# Patient Record
Sex: Female | Born: 1955
Health system: Southern US, Community
[De-identification: ages and names within clinical notes are randomized; demographics above are authoritative.]

## PROBLEM LIST (undated history)

## (undated) DIAGNOSIS — R569 Unspecified convulsions: Secondary | ICD-10-CM

## (undated) DIAGNOSIS — E782 Mixed hyperlipidemia: Secondary | ICD-10-CM

## (undated) DIAGNOSIS — G43909 Migraine, unspecified, not intractable, without status migrainosus: Secondary | ICD-10-CM

## (undated) DIAGNOSIS — G47419 Narcolepsy without cataplexy: Secondary | ICD-10-CM

## (undated) DIAGNOSIS — F329 Major depressive disorder, single episode, unspecified: Secondary | ICD-10-CM

## (undated) DIAGNOSIS — I10 Essential (primary) hypertension: Secondary | ICD-10-CM

## (undated) DIAGNOSIS — M1990 Unspecified osteoarthritis, unspecified site: Secondary | ICD-10-CM

## (undated) DIAGNOSIS — M5136 Other intervertebral disc degeneration, lumbar region: Secondary | ICD-10-CM

## (undated) DIAGNOSIS — M51369 Other intervertebral disc degeneration, lumbar region without mention of lumbar back pain or lower extremity pain: Secondary | ICD-10-CM

## (undated) DIAGNOSIS — Z87442 Personal history of urinary calculi: Secondary | ICD-10-CM

## (undated) DIAGNOSIS — K219 Gastro-esophageal reflux disease without esophagitis: Secondary | ICD-10-CM

## (undated) DIAGNOSIS — E663 Overweight: Secondary | ICD-10-CM

## (undated) DIAGNOSIS — J45909 Unspecified asthma, uncomplicated: Secondary | ICD-10-CM

## (undated) DIAGNOSIS — G4730 Sleep apnea, unspecified: Secondary | ICD-10-CM

## (undated) DIAGNOSIS — G894 Chronic pain syndrome: Secondary | ICD-10-CM

## (undated) DIAGNOSIS — F32A Depression, unspecified: Secondary | ICD-10-CM

## (undated) HISTORY — DX: Gastro-esophageal reflux disease without esophagitis: K21.9

## (undated) HISTORY — PX: TOTAL KNEE ARTHROPLASTY: SHX125

## (undated) HISTORY — DX: Overweight: E66.3

## (undated) HISTORY — DX: Mixed hyperlipidemia: E78.2

## (undated) HISTORY — PX: KNEE ARTHROSCOPY: SUR90

## (undated) HISTORY — PX: SHOULDER SURGERY: SHX246

## (undated) HISTORY — DX: Chronic pain syndrome: G89.4

## (undated) HISTORY — PX: BREAST MASS EXCISION: SHX1267

## (undated) HISTORY — DX: Unspecified osteoarthritis, unspecified site: M19.90

## (undated) HISTORY — DX: Essential (primary) hypertension: I10

## (undated) HISTORY — PX: PILONIDAL CYST EXCISION: SHX744

## (undated) HISTORY — DX: Depression, unspecified: F32.A

## (undated) HISTORY — DX: Major depressive disorder, single episode, unspecified: F32.9

## (undated) HISTORY — PX: MASTECTOMY: SHX3

---

## 2006-01-29 ENCOUNTER — Emergency Department (HOSPITAL_COMMUNITY): Admission: EM | Admit: 2006-01-29 | Discharge: 2006-01-29 | Payer: Self-pay | Admitting: Emergency Medicine

## 2006-02-23 ENCOUNTER — Emergency Department (HOSPITAL_COMMUNITY): Admission: EM | Admit: 2006-02-23 | Discharge: 2006-02-23 | Payer: Self-pay | Admitting: Emergency Medicine

## 2007-01-17 ENCOUNTER — Ambulatory Visit: Payer: Self-pay | Admitting: Cardiology

## 2007-01-18 ENCOUNTER — Ambulatory Visit: Payer: Self-pay | Admitting: Cardiology

## 2007-01-18 ENCOUNTER — Inpatient Hospital Stay (HOSPITAL_COMMUNITY): Admission: RE | Admit: 2007-01-18 | Discharge: 2007-01-19 | Payer: Self-pay | Admitting: Cardiology

## 2007-02-26 ENCOUNTER — Emergency Department (HOSPITAL_COMMUNITY): Admission: EM | Admit: 2007-02-26 | Discharge: 2007-02-26 | Payer: Self-pay | Admitting: Emergency Medicine

## 2007-08-27 ENCOUNTER — Emergency Department (HOSPITAL_COMMUNITY): Admission: EM | Admit: 2007-08-27 | Discharge: 2007-08-27 | Payer: Self-pay | Admitting: *Deleted

## 2008-01-08 ENCOUNTER — Emergency Department (HOSPITAL_COMMUNITY): Admission: EM | Admit: 2008-01-08 | Discharge: 2008-01-08 | Payer: Self-pay | Admitting: Emergency Medicine

## 2008-02-26 ENCOUNTER — Emergency Department (HOSPITAL_COMMUNITY): Admission: EM | Admit: 2008-02-26 | Discharge: 2008-02-26 | Payer: Self-pay | Admitting: Emergency Medicine

## 2008-05-06 ENCOUNTER — Encounter: Admission: RE | Admit: 2008-05-06 | Discharge: 2008-05-06 | Payer: Self-pay | Admitting: Orthopedic Surgery

## 2008-06-26 ENCOUNTER — Ambulatory Visit: Payer: Self-pay | Admitting: Internal Medicine

## 2008-06-26 ENCOUNTER — Inpatient Hospital Stay (HOSPITAL_COMMUNITY): Admission: RE | Admit: 2008-06-26 | Discharge: 2008-07-02 | Payer: Self-pay | Admitting: Orthopedic Surgery

## 2008-06-30 ENCOUNTER — Ambulatory Visit: Payer: Self-pay | Admitting: Vascular Surgery

## 2008-06-30 ENCOUNTER — Encounter: Payer: Self-pay | Admitting: Internal Medicine

## 2008-06-30 ENCOUNTER — Encounter (INDEPENDENT_AMBULATORY_CARE_PROVIDER_SITE_OTHER): Payer: Self-pay | Admitting: Internal Medicine

## 2008-08-25 ENCOUNTER — Emergency Department (HOSPITAL_COMMUNITY): Admission: EM | Admit: 2008-08-25 | Discharge: 2008-08-25 | Payer: Self-pay | Admitting: Emergency Medicine

## 2008-12-17 ENCOUNTER — Ambulatory Visit: Payer: Self-pay | Admitting: Cardiology

## 2009-03-12 ENCOUNTER — Ambulatory Visit (HOSPITAL_BASED_OUTPATIENT_CLINIC_OR_DEPARTMENT_OTHER): Admission: RE | Admit: 2009-03-12 | Discharge: 2009-03-13 | Payer: Self-pay | Admitting: Orthopedic Surgery

## 2009-06-02 DIAGNOSIS — E663 Overweight: Secondary | ICD-10-CM | POA: Insufficient documentation

## 2009-06-02 DIAGNOSIS — I10 Essential (primary) hypertension: Secondary | ICD-10-CM | POA: Insufficient documentation

## 2009-06-02 DIAGNOSIS — E785 Hyperlipidemia, unspecified: Secondary | ICD-10-CM | POA: Insufficient documentation

## 2010-10-18 ENCOUNTER — Encounter: Payer: Self-pay | Admitting: Orthopedic Surgery

## 2011-01-03 LAB — GLUCOSE, CAPILLARY
Glucose-Capillary: 183 mg/dL — ABNORMAL HIGH (ref 70–99)
Glucose-Capillary: 184 mg/dL — ABNORMAL HIGH (ref 70–99)
Glucose-Capillary: 237 mg/dL — ABNORMAL HIGH (ref 70–99)

## 2011-01-03 LAB — POCT I-STAT, CHEM 8
BUN: 17 mg/dL (ref 6–23)
Creatinine, Ser: 0.7 mg/dL (ref 0.4–1.2)
Glucose, Bld: 163 mg/dL — ABNORMAL HIGH (ref 70–99)
TCO2: 25 mmol/L (ref 0–100)

## 2011-02-08 NOTE — Consult Note (Signed)
NAMEMarland Kitchen  KIYA, ENO NO.:  [id number]   MEDICAL RECORD NO.:  [id number]          PATIENT TYPE:  INP   LOCATION:  5023                         FACILITY:  [facility]   PHYSICIAN:  Hillery Aldo, M.D.   DATE OF BIRTH:  1956/04/06   DATE OF CONSULTATION:  06/26/2008  DATE OF DISCHARGE:                                 CONSULTATION   PRIMARY CARE PHYSICIAN:  Ernestine Conrad, MD, [place], [place].   REQUESTING PHYSICIAN:  Thereasa Distance A. Mortenson, MD   HISTORY OF PRESENT ILLNESS:  The patient is a 55 year old female with  end-stage osteoarthritis of the left knee and past medical history of  diabetes who was admitted by the orthopedic service for an elective left  total knee replacement.  [facility] was asked to help  manage the patient postoperatively given her history of diabetes,  hypertension, gastroesophageal reflux disease, and other comorbidities.  The patient is currently sedated postoperatively and complaining of left  knee pain.  She is also complaining of a sensation of having to urinate,  although she has a urinary catheter in.   PAST MEDICAL HISTORY:  1. Diabetes.  2. Hypertension.  3. Dyslipidemia.  4. Depression.  5. Chronic pain syndrome.  6. Gastroesophageal reflux disease.   PAST SURGICAL HISTORY:  1. Right breast mass removed (benign).  2. Pilonidal cyst removal.  3. Arthroscopic left knee surgery.  4. Total left knee replacement.   SOCIAL HISTORY:  The patient is divorced.  She lives with her father and  her son.  She quit smoking over 20 years ago.  She denies alcohol and  drug use.  She is currently disabled.   FAMILY HISTORY:  The patient's father is alive and has coronary artery  disease and has suffered from an MI.  He also has had a stroke and is  diabetic.  The patient's mother died in her early 52s from operative  complications from an abortion.  She has 1 healthy brother and 1 healthy  sister.   CURRENT MEDICATIONS:  1. Paxil 40 mg daily.  2. Atenolol 25 mg b.i.d.  3. Glucovance 5/500 two tablets b.i.d.  4. Lipitor 80 mg daily.  5. Ecotrin 325 mg daily.  6. Flexeril 10 mg t.i.d. p.r.n.  7. Neurontin 300 mg t.i.d.  8. Prilosec 20 mg daily.  9. Xanax 0.5 mg b.i.d.  10.Hydrocodone 5/500 b.i.d.  11.Levemir FlexPen 20 units b.i.d.   ALLERGIES:  PENICILLIN, SULFA, and REGLAN.   REVIEW OF SYSTEMS:  The patient denies chest pain, dyspnea, or cough.  She denies current nausea, vomiting, or diarrhea.  She is postoperative  and complaining of significant operative pain and a sensation of needing  to urinate, although she has a urinary catheter in.   PHYSICAL EXAMINATION:  VITAL SIGNS:  Blood pressure 139/71, pulse 106,  respirations 16, and O2 saturation 92% on 2 L.  GENERAL:  Obese female who is in mild distress from operative pain.  She  is somewhat sedated.  HEENT:  Normocephalic and atraumatic.  The patient has dried blood in  her mouth and dry mucous  membranes.  NECK:  Supple.  No thyromegaly, no lymphadenopathy, and no jugular  venous distention.  CHEST:  Decreased breath sounds.  Clear bilaterally.  HEART:  Tachycardic rate, regular rhythm.  No murmurs, rubs, or gallops.  ABDOMEN:  Soft, nontender, and nondistended with normoactive bowel  sounds.  EXTREMITIES:  No clubbing, edema, or cyanosis.  The left knee is  immobilized.  NEUROLOGIC:  Sedated but nonfocal.   LABORATORY DATA:  CBG checks 233, 214, and 223.   ASSESSMENT AND PLAN:  1. Diabetes type 2:  Given the patient is postoperative and under a      good deal of physiologic stress, we would continue her usual dose      of Levemir and add sliding scale insulin, but change this to the      insulin resistant scale before meals and at bedtime.  We would have      a low threshold for adding meal coverage once she is adequately      taking in p.o.'s.  We will check the patient's hemoglobin A1c value      to determine her overall outpatient  glycemic control.  2. Hypertension:  Monitor the patient's blood pressure on her current      medications and adjust these if indicated.  3. Dyslipidemia:  The patient is currently being treated with a statin      and we will continue this as you were doing.  4. Chronic pain syndrome:  The patient is currently on a PCA pump.  We      would continue this and transition her over as tolerated to oral      medicines.  5. Depression:  We will continue the patient's Paxil as you were      doing.  6. Gastroesophageal reflux disease:  We will continue the patient on      proton pump inhibitor therapy as you were doing.   Thank you for this consultation.  We will follow the patient with you.      Hillery Aldo, M.D.  Electronically Signed     CR/MEDQ  D:  06/26/2008  T:  06/27/2008  Job:  [id number]   cc:   Ernestine Conrad, M.D.

## 2011-02-08 NOTE — Op Note (Signed)
NAME:  Cindy Norris, Cindy Norris                 ACCOUNT NO.:  [id number]   MEDICAL RECORD NO.:  [id number]          PATIENT TYPE:  AMB   LOCATION:  DSC                          FACILITY:  [facility]   PHYSICIAN:  Rodney A. Mortenson, M.D.DATE OF BIRTH:  12-22-1955   DATE OF PROCEDURE:  03/12/2009  DATE OF DISCHARGE:                               OPERATIVE REPORT   JUSTIFICATION:  A 55 year old female referred by Dr. Loney Hering from [place]  regarding painful right shoulder.  She has impingement syndrome on the  right and osteoarthritis of right AC joint, proven both clinically and  by MRI.  She is now admitted for arthroscopic decompression.  Complications are discussed preoperatively.  Questions are answered and  encouraged and the patient wishes to proceed.   JUSTIFICATION FOR OUTPATIENT SURGERY:  Minimal morbidity.   PREOPERATIVE DIAGNOSIS:  Impingement syndrome, right shoulder;  osteoarthritis, right acromioclavicular joint.   POSTOPERATIVE DIAGNOSIS:  Impingement syndrome, right shoulder;  osteoarthritis, right acromioclavicular joint.   OPERATION:  Diagnostic arthroscopy, glenohumeral joint; subacromial  decompression and distal clavicle resection through the arthroscope.   SURGEON:  Lenard Galloway. Chaney Malling, MD   SURGEON:  Arlys John D. Petrarca, PA-C   ANESTHESIA:  General.   PROCEDURE:  The patient was placed on the operating table in the supine  position.  After satisfactory general anesthesia, the patient was placed  in the semi-sitting position.  The right upper extremity and shoulder  were prepped with DuraPrep and draped out in the usual manner.  Through  a posterior portal, the arthroscope was introduced into the shoulder.  Very careful examination of shoulder was undertaken.  There was a large  divot on inferior third of the articular cartilage of the glenoid.  The  labrum, biceps, articular cartilage of the humeral head, rotator cuff.  and the subscapularis all were absolutely normal.  Once  this was  accomplished to my satisfaction, the arthroscope was removed and placed  in the subacromial space.  A fair amount of bursitis noted.  Through the  lateral portal, the ArthroCare wand was inserted and part of bursa was  resected.  The undersurface of the acromion was then debrided with  ArthroCare wand.  Bleeders were coagulated in a standard manner.  This  distal end of the clavicle was identified and isolated and the soft  tissue was cleared off this area.  Once the bone was exposed, the 6-mm  bur was inserted.  A fairly aggressive subacromial decompression was  done starting anteriorly and working posteriorly.  There was a very  large bone spur at the junction of the [facility] joint and acromion.  There was  sticking down and impinging upon the rotator cuff.  This was taken down  very aggressively.  Excellent decompression of acromion was achieved.  Once this was accomplished, the undersurface of the distal clavicle was  debrided with the bur to increase the tunnel for the supraspinatus to  pass back and forth.  An anterior portal was then made and the 6-mm bur  was inserted through the anterior portal.  The distal portion of the  clavicle was  then resected.  Excellent decompression was achieved.  The  entire circumference of the distal clavicle could then be seen.  The  arthroscope was then placed through the anterior portal and beautiful  decompression, but preservation of dorsal capsule was achieved very  nicely.  The arthroscope was then removed.  Wounds were closed with  sutures.  Sterile dressing was applied, and the patient returned to the  recovery room in excellent condition.  Technically, this went extremely  well.   DRAINS:  None.   COMPLICATIONS:  None.   DISPOSITION:  1. Percocet for pain.  2. To my office in [place] in 8 days.  3. Continue with the Cipro.      Rodney A. Chaney Malling, M.D.  Electronically Signed     RAM/MEDQ  D:  03/12/2009  T:  03/13/2009  Job:   [id number]   cc:   Dierdre Highman. Loney Hering, MD

## 2011-02-08 NOTE — Discharge Summary (Signed)
NAME:  Cindy Norris, Cindy Norris                 ACCOUNT NO.:  [id number]   MEDICAL RECORD NO.:  [id number]          PATIENT TYPE:  INP   LOCATION:  5007                         FACILITY:  [facility]   PHYSICIAN:  Lenard Galloway. Mortenson, M.D.DATE OF BIRTH:  Sep 12, 1956   DATE OF ADMISSION:  06/26/2008  DATE OF DISCHARGE:  07/02/2008                               DISCHARGE SUMMARY   ADMISSION DIAGNOSIS:  Osteoarthritis, left knee.   DISCHARGE DIAGNOSES:  1. Osteoarthritis, left knee.  2. Type 2 insulin-dependent diabetes mellitus.  3. Hypertension.  4. Chronic pain.  5. Gastroesophageal reflux disease.  6. Depression.  7. Hyperlipidemia.  8. Posthemorrhagic anemia.  9. Obesity.   PROCEDURE:  Left total knee arthroplasty.   HISTORY:  A 55 year old white female with longstanding left knee pain.  Had arthroscopy 9 years ago and was told at that time to have  osteoarthritis and would require total knee in the future.  Now having  constant severe throbbing and aching pain with occasional sharp stabbing  pain.  It wakes her at night.  It is getting worse.  Difficulty with  activity of daily living and ambulation.  Uses a cane.  Failed  conservative treatment including NSAIDs for left total knee  arthroplasty.   HOSPITAL COURSE:  A 55 year old white female admitted on June 26, 2008, after appropriate laboratory studies were obtained as well as 1 g  vancomycin IV on call to the operating room.  She was taken to the  operating room where she underwent a left total knee arthroplasty.  She  tolerated the procedure well.  Placed on 60-g carb-modified diet.  Full  dose Dilaudid PCA pump was used for pain management.  Arixtra 2.5 mg  subcu q.8 a.m. was begun on June 27, 2008.  Foley was placed  intraoperatively.  CPM 0-60 degrees for 6-8 hours per day, increasing by  5-10 degrees per day.  Consults PT, OT, and care management were made.  Weightbearing as tolerated.  She was allowed out of bed to chair  the  following day.  A glycemic control, SSI was placed at moderate  sensitivity.  OxyContin 10 mg p.o. q.12 h. was begun on postop day 1.  Arixtra instruction was given.  Lantus insulin was increased to 25 units  subcu q.12 h. by Dr. Lavera Guise.  The stat EKG and stat H&H were ordered on  June 27, 2008.  Lopressor 5 mg IV push was given on telemetry, and she  was moved to telemetry.  Cardiac enzymes were ordered stat, and they  were repeated in 8 hours.  Blood cultures x2 were ordered.  Vancomycin  1750 mg IV q.12 h. was started.  Lopressor 5 mg IV x1 stat was also  ordered.  D-dimer was ordered on June 28, 2008, also.  CT angio to  rule out PE.  Lower extremity Dopplers to rule out DVT.  This was on  June 28, 2008.  Her dressing was changed on June 28, 2008, and  revealed the wounds to be healing without signs of infection.  She was  typed and crossed for 2  units packed cells and transfused on June 28, 2008.  Lantus was increased to 30 units subcu q.12 h.  KCl 40 mEq p.o.  was also given.  On June 28, 2008, OxyContin was discontinued.  She  was allowed Percocet 5/325 one to two tablets every 4 hours p.r.n.  severe pain, Norco 5/325 one to two tablets every 4 hours as needed for  pain.  A nutritional consult ordered.  MiraLax 17 g p.o. with 8 ounces  of water p.r.n.  Her vancomycin was discontinued on June 29, 2008.  Atenolol 50 mg p.o. b.i.d. was started.  A 2-D echo was ordered because  of an abnormal EKG.  Ensure was started.  Her telemetry was discontinued  on July 01, 2008.  She was then transferred to 5000, but she was  discharged home on July 02, 2008, to return back to the office in 2  weeks for followup.  Dopplers revealed no evidence of DVT, superficial  thrombosis, or Baker cyst.  This was technically limited.  Transthoracic  echocardiogram cannot fully evaluate regional wall motion as endocardium  is not well seen.  Overall, left ventricular systolic function  was  normal.  EKG of June 19, 2008, revealed normal.  EKG of June 27, 2008, showed sinus tachycardia with ST and T-wave abnormality  considering inferior ischemia or anterolateral ischemia.  On June 28, 2008, EKG revealed sinus tach with nonspecific T-wave abnormality.  CT  angio revealed no evidence of acute pulmonary embolism.  Small bilateral  pleural effusions and bibasilar atelectasis were noted.  Globular  enlargement of the right adrenal gland to 15 mm was incompletely imaged.  Very mild pericardial thickening.   LABORATORY STUDIES:  Admitting hemoglobin 11.9, hematocrit 35.8%, white  count 9400, and platelets 262,000.  Discharge hemoglobin 11.1,  hematocrit 32.6%, white count 9400, and platelets 295,000.  Her D-dimer  was 0.94.  Preop sodium 142, potassium 4.2, chloride 105, CO2 of 30,  glucose 211, BUN 10, creatinine 0.69, potassium did drop to 3.1 on  June 29, 2008.  Discharge sodium 138, potassium 3.9, chloride 102, CO2  of 28, glucose 219, BUN 11, creatinine 0.60, GFR was greater than 60  throughout her hospital stay.  Preop total protein 6.2, albumin 3.9.  AST 28, ALT 35, ALP 85, and total bilirubin 0.6.  Glycosylated  hemoglobin 7.5.  CK of June 27, 2008, revealed 546, MB 2.0, index 0.4,  and troponins were 0.01.  Repeat 438 was CK, MB was 1.4, index 0.3,  troponin 0.04.  TSH was 0.743.  Urinalysis revealed moderate leukocytes  with rare epithelials, 11-20 white cells, 0-2 red, rare bacteria.  Blood  type O+, antibody screen negative.  Given 2 units of packed cells during  the hospital course.  Two blood cultures revealed no growth.  Urine  culture revealed 4000 colonies of insignificant growth.   DISCHARGE INSTRUCTIONS:  She is discharged in her diabetic diet as per  home.  Increase activity slowly.  Crutches weightbearing as tolerated.  No lifting or driving for 6 weeks.  CPM 0-70 degrees for 6-8 hours per  day increasing 5-10 degrees per day.   Prescription of Percocet 5/325 one  to two tablets every 4 hours as needed for pain; Robaxin 5/500 mg 1  tablet every 6 hours as needed for spasm; Arixtra 2.5 mg inject daily at  8 a.m., last dose July 04, 2008; begin aspirin at 325 mg daily on  July 05, 2008.  Continue with regular medicines.  She was discharged  in improved condition and will be followed back up in the office on  July 09, 2008, by Dr. Chaney Malling.      Oris Drone Petrarca, P.A.-C.      Rodney A. Chaney Malling, M.D.  Electronically Signed    BDP/MEDQ  D:  08/24/2008  T:  08/25/2008  Job:  [id number]

## 2011-02-08 NOTE — Assessment & Plan Note (Signed)
[facility]                          [facility] OFFICE NOTE   NAME:Cindy Norris, Cindy Norris                        MRN:          [id number]  DATE:12/17/2008                            DOB:          [birth year]    Cindy Norris is seen today as a post hospital visit from when we saw her in  the hospital in October of 2009.  She had sinus tachycardia at that  time.  Also, she is seen today for cardiac clearance to have a procedure  on her kidney stones.   Cindy Norris has been feeling relatively well recently.  She is not having  any significant chest pain or shortness of breath.  We know that she has  diabetes.  She has hypertension and it is stable at this time.  She does  have some depression and mild chronic pain.  There is a problem with  obesity.  In addition, the patient has some calcification of the  coronary arteries proven by cardiac catheterization in April of 2008.  At that time there was some calcium in the proximal LAD.   When the patient was hospitalized for knee surgery in October of 2009  she had some sinus tachycardia after her surgery with abnormal EKG.  She  was assessed by Dr. Lewayne Bunting of our team.  A 2-D echo was done.  The  study was technically difficult but she did have normal LV function.  It  was felt that her overall cardiac status was stable at that point.   PAST MEDICAL HISTORY:   ALLERGIES:  SULFA, REGLAN, DILAUDID, AZITHROMYCIN AND PENICILLIN.  MANY  OF THESE ARE RELATED TO GI UPSET.  THE PATIENT DID HAVE A RASH FROM  SULFA.   MEDICATIONS:  Prilosec 20, atenolol 25 b.i.d., Neurontin 300 t.i.d.,  multivitamin, Lipitor, Paxil, glyburide/metformin, Ecotrin, Voltaren and  Levemir.   OTHER MEDICAL PROBLEMS:  See the list below.   REVIEW OF SYSTEMS:  The patient is not having any major skin problems at  this time.  She has no sign of any infections.  She has no headaches or  seizures.  There is question of sleep apnea.  She does  not have any  shortness of breath at this time.  The patient does have diabetes.  There is question of irritable bowel.  She has some arthritis and also  fibromyalgia.  She has had problems with some kidney discomfort and she  has kidney stones and she needs further treatment of this.  She also has  a problem with depression and anxiety.  She does have some edema when  standing during the day.  Otherwise her review of systems is negative at  this time.   PHYSICAL EXAM:  Blood pressure is 136/79 with a pulse of 84.  Weight is  231 pounds.  The patient is oriented to person, time and place.  Affect  is normal.  She is significantly overweight.  HEENT:  Reveals no xanthelasma.  She has normal extraocular motion.  There are no carotid bruits.  There is no jugular venous distention.  The  lungs are clear.  Respiratory effort is not labored.  Cardiac exam reveals an S1 with an S2.  There are no clicks or  significant murmurs.  The abdomen is protuberant but soft.  She has mild peripheral edema.  She has no significant skin rashes.   EKG is done.  It is reviewed by me and shows normal sinus rhythm with a  normal EKG.   PROBLEMS:  Include:  1. History of osteoarthritis of the left knee, for which she has had      an arthroplasty.  2. Diabetes.  3. Hypertension, stable.  4. Chronic pain syndrome.  5. Gastroesophageal reflux disease.  6. Depression.  7. Hyperlipidemia.  8. Obesity.  9. Kidney stones, with a need for cardiac clearance for a procedure to      remove her stones.  After review of all the information, I believe      that she is stable.  She does not need any further cardiac workup.      She is cleared to have a procedure for her kidney stones.     Luis Abed, MD, [facility]  Electronically Signed    JDK/MedQ  DD: 12/17/2008  DT: 12/17/2008  Job #: [id number]   cc:   Ernestine Conrad, MD

## 2011-02-08 NOTE — Consult Note (Signed)
NAMEARACELIE, ADDIS                 ACCOUNT NO.:  [id number]   MEDICAL RECORD NO.:  [id number]          PATIENT TYPE:  INP   LOCATION:  4743                         FACILITY:  [facility]   PHYSICIAN:  Doylene Canning. Ladona Ridgel, MD    DATE OF BIRTH:  Jul 27, 1956   DATE OF CONSULTATION:  06/28/2008  DATE OF DISCHARGE:                                 CONSULTATION   REQUESTING PHYSICIAN:  Sorin Lavera Guise, MD   INDICATION FOR CONSULTATION:  Evaluation of sinus tachycardia in the  setting of an abnormal EKG.   HISTORY OF PRESENT ILLNESS:  The patient is a very pleasant 55 year old  woman who was admitted to the hospital for total knee replacement and  postoperatively developed shortness of breath, an abnormal EKG, and  sinus tachycardia.  Previous evaluation today was notable for  catheterization earlier this year, which demonstrated calcified  coronaries, but no occult narrowing.  The patient postoperatively was  stable, but then last night developed shortness of breath and an  abnormal EKG along with sinus tachycardia.  The patient was fairly  heavily sedated on my evaluation today and she could not give me much in  the way of history.  A CT scan has been performed, which demonstrated no  pulmonary embolism.  Her 12-lead EKG from 8 o'clock last p.m.  demonstrates sinus tachycardia with inferior and lateral T-wave  abnormality concerning for but not diagnostic of ischemia.  Serial  cardiac enzymes so far have been negative for cardiac injury.  The  patient has no specific complaints today.   Her past medical history is notable for diabetes x20 years, hypertension  x10 years, dyslipidemia, morbid obesity, and depression.   Her family history is notable for mother dying in her 30s.  Her father  is still living, but has coronary artery disease.   SOCIAL HISTORY:  The patient is divorced.  She lives with her father and  son in [place].  She does not use tobacco or ethanol.   REVIEW OF SYSTEMS:  The  patient wears glasses for visual acuity.  She  has rare episodes of diarrhea.  She has reflux symptoms.  She has  depression and anxiety.  She may well have sleep disorder breathing,  though this has not been documented.  The rest review of systems was  negative.   PHYSICAL EXAMINATION:  GENERAL:  She is a pleasant, sleepy, middle-aged  obese woman in no acute distress.  VITAL SIGNS:  Blood pressure 117/64.  The pulse was 100 and regular.  The respirations were 20.  The temperature was 98.  HEENT:  Normocephalic and atraumatic.  Pupils were equal and round.  Oropharynx moist.  Sclerae were anicteric.  NECK:  No jugular distention.  There were no thyromegaly.  Trachea was  midline.  Carotids were 2+ and symmetric.  LUNGS:  Clear bilaterally to auscultation.  No wheezes, rales, or  rhonchi were present.  There were no increased work of breathing.  CARDIOVASCULAR:  Regular, tachycardia with normal S1-S2.  There was a  soft S4 gallop present.  The PMI was enlarged and laterally displaced.  ABDOMEN:  Soft, nontender, and nondistended.  There was no organomegaly.  The bowel sounds were present.  There was no rebound or guarding.  EXTREMITIES:  No cyanosis, clubbing, or edema.  The pulses were 2+,  symmetric.  NEUROLOGIC:  Difficult to assess because the patient was sleepy and  could not easily follow commands.   Her EKG demonstrates sinus tachycardia with nonspecific T-wave  abnormality at 100 beats per minute.  EKG from yesterday evening  demonstrates sinus tachycardia with inferior and lateral ST-segment and  T-wave changes, cannot exclude ischemia.   IMPRESSION:  1. Shortness of breath after total knee replacement with negative CT      of the chest.  2. Sinus tachycardia.  3. History of nonobstructive but calcified coronary arteries.  4. Diabetes.  5. Hypertension.  6. Obesity.  7. Dyslipidemia.   RECOMMENDATIONS:  At this time as follows:  We will continue to check  serial  cardiac enzymes.  We will use p.o. and IV beta-blockers as needed  to control her heart rate and keep her blood pressure controlled.  We  will look for additional causes of sinus tachycardia including checking  her H&H and we will plan on reviewing her prior cardiac catheterization,  but I would not recommend invasive coronary evaluation unless the  cardiac enzymes turn positive.  With regard to her overall followup, we  will see how she does.  She may ultimately require 2-D echo, and I would  watch her hemoglobin very carefully.      Doylene Canning. Ladona Ridgel, MD  Electronically Signed     Doylene Canning. Ladona Ridgel, MD  Electronically Signed    GWT/MEDQ  D:  06/28/2008  T:  06/28/2008  Job:  [id number]

## 2011-02-08 NOTE — Op Note (Signed)
NAME:  Cindy Norris, Cindy Norris                 ACCOUNT NO.:  [id number]   MEDICAL RECORD NO.:  [id number]          PATIENT TYPE:  INP   LOCATION:  5023                         FACILITY:  [facility]   PHYSICIAN:  Lenard Galloway. Mortenson, M.D.DATE OF BIRTH:  1956/09/17   DATE OF PROCEDURE:  DATE OF DISCHARGE:                               OPERATIVE REPORT   PREOPERATIVE DIAGNOSIS:  Severe osteoarthritis of left knee, end-stage  knee disease.   POSTOPERATIVE DIAGNOSIS:  Severe osteoarthritis of left knee, end-stage  knee disease.   OPERATION:  Left total knee using computer navigation with a standard  left cemented femoral component, and a DePuy MBT revision cemented 2.5  size tibial tray with a 12.5 mm standard-sized Poly insert, and a  standard metal back patella.   SURGEON:  Lenard Galloway. Chaney Malling, MD   ASSISTANT:  Oris Drone. Petrarca, PA-C   ANESTHESIA:  General.   PROCEDURE:  The patient was placed on the operative table in supine  position with a pneumatic tourniquet above the left upper thigh.  The  entire left lower extremity was prepped with DuraPrep and draped down in  the usual manner.  Vi-Drape was placed over the operative site.  Leg was  wrapped out in Esmarch, tourniquet was elevated.  Incision was made  starting above the patella and carried down to the tibial tubercle.  Bleeders were coagulated.  A long medial parapatellar incision was made  and the patella was everted.  Osteophytes removed off both femoral  condyles and the margin of the patella.  The knee was flexed at 90  degrees.  Both medial and lateral meniscus were excised and the cruciate  ligaments were released.  Once this was done to my satisfaction,  attention was turned to inserting the Schanz pins, two in the distal  femur and two in the proximal tibia in the standard manner.  The femoral  and tibial arrays were put in place.  We then started registration  define the mechanical axis of the femoral head sooner.  The tibial  mechanical axis all following computer promptings were then defined the  proximal tibial plateau and defined the femoral condyles and  epicondyles.  This was then fed into the computer and suggestions for  tibial resections were made.  Following the computer prompting, tibial  resection guide was closed in the proximal tibia, locked in the proper  position.  The tibial guide was applied and the proximal end of the  tibia was then amputated with single large piece.  This was removed and  then soft tissues were released.  Once this was done, the soft tissue  balancing was done on both flexion and extension and there was tightness  on the medial side along the soft tissues on the medial sleeve.  The  proximal medial soft tissues were released with a Cobb.  Finally, the  tension device showed there was excellent balancing of the ligaments in  both flexion and extension.  Next, the femoral implant planning was  registered and suggestions were made.  The guide #1 was placed over the  distal end  of the femur and the anterior and posterior cuts were made  following computer suggestions.  When the guide was locked in place with  pins, anterior and posterior cuts were made.  Next, the distal femoral  cutting block was placed over the anterior cut surface of the femur and  placed appropriate level according to the computer promptings.  This was  locked in place, and distal end of the femur was amputated.  The spacer  block was placed with the knee in flexion and extension and had good  balancing of all the ligaments.  Next, distal femoral resection guide  was placed over the distal end of the femur, locked in place.  Chamfer  cuts were made and drill holes were made.  There were some osteophytes  on the posterior aspect of the femoral condyles, which were removed with  an osteotome.  Attention was then turned to the proximal tibia which was  subluxed anteriorly with a retractor.  A 2.5 tibial trial  seated very  nicely along the line locked in place and the smokestack was applied and  a large drill was used to drill the hole in proximal tibia.  The large  drill was removed as well as smokestack.  The tibial trial was then  exchanged out for the revision cemented tray and this was locked in  place.  The 12.5-mm trial was inserted on the tibial tray and a standard  femoral trial was placed over the distal end of the femur.  The knee was  then articulated and put through a full range of motion.  There was  excellent balance in all ligaments in flexion and extension and varus  and valgus, but I was very pleased with balancing.  Attention was then  turned to the posterior aspect of patella.  This amputated in a standard  manner using cutting block and then drill holes were placed in posterior  aspect of patella and the patella trial was inserted.  The knee was  articulated again put through a full range of motion, there was nice  tracking of the patella.  At this point, all the trial components were  removed.  Pulsating lavage was used to remove all debris.  Glue was  mixed on the back table and the final components were assembled on the  back table.  Glue was placed on all the components on the back table and  glue was placed over the proximal tibia and the distal end of the femur.  Each of the components were then sequentially inserted starting with the  tibial tray first, excess glue was removed.  The trial poly was inserted  to the tibia and glue was placed over the distal end of the femur.  The  femoral components were then driven home and excess glue removed.  The  knee was held in extension and there was good alignment.  Glue was  placed on the posterior aspect of the patella and patellar component was  inserted and the drill holes were held in place with a clamp.  All  excess glue was removed through each of the steps in a standard manner.  The glue was allowed to cure.  Knee was  then flexed and the excess glue  was removed through the small osteotome.  The trial poly was removed.  The pulsating lavage was used again to remove any debris.  Marcaine was  placed into the wound edges and in the capsule.  FloSeal was inserted.  Once this was accomplished, tourniquet was dropped and all bleeders were  coagulated.  Good hemostasis was achieved.  No arterial bleeding was  seen.  The final Poly was then snapped, fitted in place, and the knee  articulated.  Once again there was an excellent alignment, excellent  flexion and extension, wonderful stability.  The long medial  parapatellar incision was then closed with interrupted heavy Ethibond  sutures.  Subcutaneous tissue was closed with Vicryl and the skin closed  with stainless steel staples.  Sterile dressing was applied.  The  patient returned recovery room in excellent condition. Technically, this  procedure went extremely well.  I was well pleased with the final  outcome.      Rodney A. Chaney Malling, M.D.  Electronically Signed     RAM/MEDQ  D:  06/26/2008  T:  06/27/2008  Job:  [id number]

## 2011-02-11 NOTE — Cardiovascular Report (Signed)
NAMEDEMETRIUS, BARRELL                 ACCOUNT NO.:  [id number]   MEDICAL RECORD NO.:  [id number]          PATIENT TYPE:  INP   LOCATION:  3733                         FACILITY:  [facility]   PHYSICIAN:  Salvadore Farber, MD  DATE OF BIRTH:  Jul 30, 1956   DATE OF PROCEDURE:  01/19/2007  DATE OF DISCHARGE:                            CARDIAC CATHETERIZATION   PROCEDURES:  1. Left heart catheterization.  2. Left ventriculography.  3. Coronary angiography.   INDICATIONS:  Ms. Batchelder is a 55 year old lady with 20 years of diabetes  mellitus and 10 years of hypertension and hypercholesterolemia as well  as obstructive sleep apnea.  She presented on January 16, 2007, [facility] with sharp chest pain that lasted approximately a minute at a  time but recurred several times.  She was admitted to hospital, where  she ruled out for myocardial infarction by serial enzymes and  electrocardiograms.  She underwent exercise treadmill test at [facility].  She had no chest pain.  The test was equivocal due to lead  loss during the study and lead motion.  She was referred for diagnostic  angiography.   PROCEDURAL TECHNIQUE:  Informed consent was obtained.  Under 1%  lidocaine local anesthesia, a 5-French sheath was placed in the right  common femoral artery using the modified Seldinger technique.  Diagnostic angiography and ventriculography were performed using JL-4,  JR-4, and pigtail catheters.  The arteriotomy was then closed using an  Angio-Seal device.  Complete hemostasis was obtained.  She was then  transferred to the holding room in stable condition having tolerated the  procedure well.   COMPLICATIONS:  None.   FINDINGS:  1. LV:  163/15/17.  EF 65% without regional wall motion abnormality.  2. No aortic stenosis or mitral regurgitation.  3. Left main:  Angiographically normal.  4. LAD:  There is calcium in the proximal vessel without stenosis.  5. Ramus intermedius:  A large vessel  which is angiographically      normal.  6. Circumflex:  A moderate-sized vessel giving rise to two marginals.      It is angiographically normal.  7. RCA:  Moderate-sized dominant vessel.  It is angiographically      normal.   IMPRESSION/PLAN:  Patient with no coronary stenoses, though she does  have calcification in the proximal left anterior descending artery.  Left ventricular size and systolic function are normal.  I suspect a  noncardiac etiology to her chest discomfort.  Will discharge home today.      Salvadore Farber, MD  Electronically Signed     WED/MEDQ  D:  01/19/2007  T:  01/19/2007  Job:  [id number]   cc:   Marcille Blanco, MD,FACC

## 2011-02-11 NOTE — Discharge Summary (Signed)
Cindy Norris, Cindy Norris NO.:  [id number]   MEDICAL RECORD NO.:  [id number]          PATIENT TYPE:  INP   LOCATION:  3733                         FACILITY:  [facility]   PHYSICIAN:  Cindy Abed, MD, FACCDATE OF BIRTH:  02-26-1956   DATE OF ADMISSION:  01/18/2007  DATE OF DISCHARGE:  01/19/2007                               DISCHARGE SUMMARY   PRIMARY CARDIOLOGIST:  Dr. Nona Norris.   PRIMARY CARE Cindy Norris:  Dr. Olena Leatherwood in [place].   PRINCIPAL DIAGNOSIS:  Atypical chest pain.   SECONDARY DIAGNOSES:  1. Hypertension.  2. Hyperlipidemia.  3. Type 2 diabetes mellitus.  4. Obesity.  5. Obstructive sleep apnea with intolerance to CPAP therapy.   ALLERGIES:  1. PENICILLIN.  2. SULFA.  3. ERYTHROMYCIN.  4. REGLAN.   PROCEDURE:  Left heart cardiac catheterization.   HISTORY OF PRESENT ILLNESS:  A 55 year old Caucasian female with no  prior history of CAD, but with multiple risk factors, including  hypertension, hyperlipidemia and diabetes who was admitted to [facility] this week secondary to sharp chest pain that began on April 22  prior to eating dinner, lasting about 1 minute and resolving  spontaneously.  Symptoms recurred, prompting her to present to [facility] where cardiac markers were negative and ECG showed nonspecific  T wave flattening in counter clockwise rotation.  She was seen by Dr.  Diona Browner in consultation and the patient opted for a noninvasive  approach to evaluation and, therefore, a Myoview study was performed at  [facility] revealing an anterior defect with mildly  reversibility with an EF of 45%.  Decision was made, at that point, to  transfer her to [facility] for further evaluation, cardiac  catheterization.   HOSPITAL COURSE:  Cindy Norris underwent left heart cardiac  catheterization, which revealed normal coronary arteries and a mildly  calcified proximal LAD.  EF was 65% without regional wall motion  abnormalities.  Postprocedure, she has been ambulating without  difficulty or recurrent discomfort.  She is being discharged home today  in satisfactory condition.   DISPOSITION:  Patient is being discharged home in good condition.   FOLLOWUP PLANS & APPOINTMENTS:  She asked to follow up with Dr. Olena Leatherwood  in 1-2 weeks.   DISCHARGE MEDICATIONS:  1. Lipitor 40 mg daily.  2. Zestril 5 mg daily.  3. Xanax 25 mg q.i.d.  4. Voltaren 75 mg b.i.d.  5. Omeprazole 20 mg daily.  6. Ecotrin 81 mg daily.  7. Paxil 40 mg daily.  8. Glucovance 5/500 mg b.i.d.   OUTSTANDING LAB STUDIES:  None.   DURATION DISCHARGE ENCOUNTER:  Twenty minutes, including physician time.      Cindy Norris, Cindy Norris      Cindy Abed, MD, [facility]  Electronically Signed    CB/MEDQ  D:  01/19/2007  T:  01/20/2007  Job:  [phone] [id number]   cc:   Lia Hopping

## 2011-03-22 ENCOUNTER — Encounter: Payer: Self-pay | Admitting: Cardiology

## 2011-06-27 LAB — GLUCOSE, CAPILLARY
Glucose-Capillary: 115 — ABNORMAL HIGH
Glucose-Capillary: 132 — ABNORMAL HIGH
Glucose-Capillary: 141 — ABNORMAL HIGH
Glucose-Capillary: 160 — ABNORMAL HIGH
Glucose-Capillary: 168 — ABNORMAL HIGH
Glucose-Capillary: 171 — ABNORMAL HIGH
Glucose-Capillary: 173 — ABNORMAL HIGH
Glucose-Capillary: 187 — ABNORMAL HIGH
Glucose-Capillary: 193 — ABNORMAL HIGH
Glucose-Capillary: 193 — ABNORMAL HIGH
Glucose-Capillary: 208 — ABNORMAL HIGH
Glucose-Capillary: 214 — ABNORMAL HIGH
Glucose-Capillary: 217 — ABNORMAL HIGH
Glucose-Capillary: 223 — ABNORMAL HIGH
Glucose-Capillary: 233 — ABNORMAL HIGH
Glucose-Capillary: 241 — ABNORMAL HIGH

## 2011-06-27 LAB — COMPREHENSIVE METABOLIC PANEL
ALT: 35
AST: 28
Albumin: 3.9
Alkaline Phosphatase: 85
BUN: 10
CO2: 30
Calcium: 9.5
Creatinine, Ser: 0.69
GFR calc Af Amer: >60
Total Bilirubin: 0.6

## 2011-06-27 LAB — CBC
Hemoglobin: 11.9 — ABNORMAL LOW
Hemoglobin: 10.3 — ABNORMAL LOW
Hemoglobin: 10.3 — ABNORMAL LOW
Hemoglobin: 11.1 — ABNORMAL LOW
MCHC: 33.3
MCHC: 34.2
MCV: 86.6
MCV: 85.8
Platelets: 263
Platelets: 222
Platelets: 265
Platelets: 295
RBC: 4.14
RBC: 3.51 — ABNORMAL LOW
RBC: 3.74 — ABNORMAL LOW
RBC: 3.77 — ABNORMAL LOW
RDW: 13.1
RDW: 12.8
RDW: 13.3
WBC: 9.4
WBC: 11.2 — ABNORMAL HIGH
WBC: 11.8 — ABNORMAL HIGH
WBC: 9.4

## 2011-06-27 LAB — CARDIAC PANEL(CRET KIN+CKTOT+MB+TROPI)
CK, MB: 1.4
Relative Index: 0.3
Total CK: 438 — ABNORMAL HIGH
Total CK: 546 — ABNORMAL HIGH
Troponin I: 0.01
Troponin I: 0.04

## 2011-06-27 LAB — TYPE AND SCREEN
ABO/RH(D): O POS
Antibody Screen: NEGATIVE

## 2011-06-27 LAB — BASIC METABOLIC PANEL
BUN: 10
BUN: 10
BUN: 11
CO2: 28
CO2: 28
CO2: 31
CO2: 32
Calcium: 8.5
Calcium: 8.8
Calcium: 9.3
Chloride: 102
Chloride: 104
Chloride: 99
Creatinine, Ser: 0.57
Creatinine, Ser: 0.6
Creatinine, Ser: 0.6
Creatinine, Ser: 0.61
GFR calc Af Amer: >60
GFR calc Af Amer: >60
GFR calc Af Amer: >60
GFR calc Af Amer: >60
GFR calc non Af Amer: >60
GFR calc non Af Amer: >60
Glucose, Bld: 194 — ABNORMAL HIGH
Glucose, Bld: 219 — ABNORMAL HIGH
Glucose, Bld: 224 — ABNORMAL HIGH
Potassium: 3.4 — ABNORMAL LOW
Sodium: 138
Sodium: 141

## 2011-06-27 LAB — DIFFERENTIAL
Monocytes Absolute: 0.5
Monocytes Relative: 6

## 2011-06-27 LAB — TSH: TSH: 0.743

## 2011-06-27 LAB — URINE MICROSCOPIC-ADD ON

## 2011-06-27 LAB — URINALYSIS, ROUTINE W REFLEX MICROSCOPIC
Glucose, UA: >1000 — AB
Ketones, ur: NEGATIVE
Nitrite: NEGATIVE
Specific Gravity, Urine: 1.025
pH: 5.5

## 2011-06-27 LAB — PREPARE RBC (CROSSMATCH)

## 2011-06-27 LAB — APTT: aPTT: 28

## 2011-06-27 LAB — D-DIMER, QUANTITATIVE: D-Dimer, Quant: 0.94 — ABNORMAL HIGH

## 2011-06-27 LAB — CULTURE, BLOOD (ROUTINE X 2): Culture: NO GROWTH

## 2011-06-27 LAB — PROTIME-INR: Prothrombin Time: 13.3

## 2011-06-27 LAB — HEMOGLOBIN AND HEMATOCRIT, BLOOD: HCT: 26.9 — ABNORMAL LOW

## 2011-06-27 LAB — HEMOGLOBIN A1C: Mean Plasma Glucose: 169

## 2011-06-27 LAB — ABO/RH: ABO/RH(D): O POS

## 2011-06-28 LAB — BASIC METABOLIC PANEL
CO2: 28
Chloride: 105
GFR calc non Af Amer: >60
Glucose, Bld: 239 — ABNORMAL HIGH
Potassium: 3.5
Sodium: 139

## 2011-06-28 LAB — POCT CARDIAC MARKERS
CKMB, poc: <1 — ABNORMAL LOW
Troponin i, poc: <0.05

## 2011-07-14 LAB — URINE MICROSCOPIC-ADD ON

## 2011-07-14 LAB — CBC
HCT: 36.2
Hemoglobin: 12.5
RDW: 12.3

## 2011-07-14 LAB — DIFFERENTIAL
Basophils Absolute: 0
Eosinophils Relative: 1
Lymphocytes Relative: 21
Lymphs Abs: 1.5
Monocytes Absolute: 0.5
Monocytes Relative: 7
Neutro Abs: 5.2

## 2011-07-14 LAB — URINALYSIS, ROUTINE W REFLEX MICROSCOPIC
Bilirubin Urine: NEGATIVE
Nitrite: NEGATIVE
Specific Gravity, Urine: 1.02
Urobilinogen, UA: 0.2

## 2011-07-14 LAB — BASIC METABOLIC PANEL
BUN: 10
CO2: 26
Chloride: 103
Creatinine, Ser: 0.92

## 2011-11-14 ENCOUNTER — Encounter (HOSPITAL_BASED_OUTPATIENT_CLINIC_OR_DEPARTMENT_OTHER): Payer: Self-pay

## 2012-01-03 ENCOUNTER — Ambulatory Visit (HOSPITAL_BASED_OUTPATIENT_CLINIC_OR_DEPARTMENT_OTHER): Payer: Medicaid Other | Attending: Neurology | Admitting: Radiology

## 2012-01-03 DIAGNOSIS — G4710 Hypersomnia, unspecified: Secondary | ICD-10-CM | POA: Insufficient documentation

## 2012-01-03 DIAGNOSIS — Z79899 Other long term (current) drug therapy: Secondary | ICD-10-CM | POA: Insufficient documentation

## 2012-01-08 NOTE — Procedures (Signed)
NAME:  Cindy Norris, Cindy Norris                 ACCOUNT NO.:  [id number]  MEDICAL RECORD NO.:  [id number]          PATIENT TYPE:  OUT  LOCATION:  SLEEP LAB                     FACILITY:  [facility]  PHYSICIAN:  Airik Goodlin A. Gerilyn Pilgrim, M.D. DATE OF BIRTH:  08/29/1956  DATE OF STUDY:                           NOCTURNAL POLYSOMNOGRAM  REFERRING PHYSICIAN:  Lataisha Colan A. Gerilyn Pilgrim, M.D.  INDICATION:  This is a 56 -year-old who has severe hypersomnia. Clinical diagnosis is suspicious for narcolepsy.  There is a history of apnea, but she is currently compliant, and still has marked residual hypersomnia.   MEDICATIONS:  Nocturnal oxygen, alprazolam, gentamicin, Flexeril, glyburide, Prilosec, Paxil, atenolol, multivitamin, Neurontin, Nasonex, aspirin, sumatriptan.  EPWORTH SLEEPINESS SCALE: 1. BMI 38.  ANALYSIS:  The patient was to be off all antidepressants and other confined medications.  Unfortunately, it appeared that these instructions were not followed.  This, therefore, limits the interpretation of this study.  The study was done, however, using the typical protocols in the daytime and with the patient using her CPAP machine.  There are a total of 5 naps.  The initial nap shows sleep latency of 1 with no sleep onset REM.  The second nap had sleep latency of 0 with no sleep onset REM.  The third nap had a sleep latency of 0 and no sleep onset REM.  The fourth nap had a sleep latency of 0, and no sleep onset REM.  The fifth nap had a sleep latency of 0, and no sleep onset REM.  The mean sleep latency is 0.2 with no sleep onset REM observed.  IMPRESSION:  This multiple sleep latency test is limited since the patient is on a powerful REM suppressant Paxil; however, the study shows severe hypersomnia, highly characteristic of narcolepsy.   Benisha Hadaway A. Gerilyn Pilgrim, M.D.    KAD/MEDQ  D:  01/07/2012 22:41:32  T:  01/08/2012 62:13:08  Job:  [id number]

## 2013-07-25 ENCOUNTER — Other Ambulatory Visit (HOSPITAL_COMMUNITY): Payer: Self-pay

## 2013-07-25 DIAGNOSIS — G4730 Sleep apnea, unspecified: Secondary | ICD-10-CM

## 2013-08-15 ENCOUNTER — Ambulatory Visit: Payer: Medicare Other | Attending: Neurology | Admitting: Sleep Medicine

## 2013-08-15 VITALS — Ht 66.0 in | Wt 230.0 lb

## 2013-08-15 DIAGNOSIS — G4761 Periodic limb movement disorder: Secondary | ICD-10-CM | POA: Insufficient documentation

## 2013-08-15 DIAGNOSIS — G4730 Sleep apnea, unspecified: Secondary | ICD-10-CM

## 2013-08-15 DIAGNOSIS — G4733 Obstructive sleep apnea (adult) (pediatric): Secondary | ICD-10-CM | POA: Insufficient documentation

## 2013-08-28 NOTE — Procedures (Signed)
[facility] Jayani Rozman A. Gerilyn Pilgrim, MD     [email or web address]        NAME:  Cindy Norris, Cindy Norris                 ACCOUNT NO.:  [id number]  MEDICAL RECORD NO.:  [id number]          PATIENT TYPE:  OUT  LOCATION:  SLEEP LAB                     FACILITY:  [facility]  PHYSICIAN:  Kathaleya Mcduffee A. Gerilyn Pilgrim, M.D. DATE OF BIRTH:  [birth year]  DATE OF STUDY:  08/15/2013                           NOCTURNAL POLYSOMNOGRAM  REFERRING PHYSICIAN:  Raahim Shartzer A. Gerilyn Pilgrim, M.D.  INDICATION FOR STUDY:  This is a 57 year old who has a diagnosis of obstructive sleep apnea syndrome documented on previous nocturnal polysomnography.  This is a CPAP titration.  EPWORTH SLEEPINESS SCORE:  20.  BMI:  37.  ARCHITECTURAL SUMMARY:  The total recording time is 392 minutes.  Sleep efficiency 75%.  Sleep latency is 21 minutes, REM latency 274 minutes. Stage N1 12%, N2 63%, N3 40%, and REM sleep 11%.  MEDICATIONS:  Insulin, Flexeril, Lipitor, venlafaxine, metformin, oxybutynin, Voltaren, Prilosec, bupropion, Imitrex, gabapentin, atenolol, Xanax, Prompt, potassium, hydrocodone, Provigil, Topamax.  RESPIRATORY DATA:  Baseline oxygen saturation is 96, lowest oxygen saturation 88 during non-REM sleep.  The patient was placed on positive pressure starting at 5 and subsequently increased to a high pressure of 14.  The patient actually did well with pressure between 12 and 14.  He had somewhat less snoring on 13 and 14.  LIMB MOVEMENT SUMMARY:  PLM index is 9.  ELECTROCARDIOGRAM SUMMARY:  Average heart rate is 85 with no significant dysrhythmias observed.  IMPRESSION: 1. Obstructive sleep apnea syndrome, which responds well to a CPAP of     pressures between 12 and 14. 2. Mild periodic limb movement throughout the sleep.  RECOMMENDATIONS:  Either any of the pressure between 12, 13, or 14 may be tried.  Thanks for this referral.    Shirlette Scarber A. Gerilyn Pilgrim, M.D.    KAD/MEDQ  D:  08/28/2013 17:36:09  T:  08/28/2013 17:54:54  Job:   [id number]

## 2014-01-23 ENCOUNTER — Other Ambulatory Visit (HOSPITAL_COMMUNITY): Payer: Self-pay

## 2014-01-23 DIAGNOSIS — G4730 Sleep apnea, unspecified: Secondary | ICD-10-CM

## 2014-02-09 ENCOUNTER — Ambulatory Visit: Payer: Medicare Other | Attending: Neurology | Admitting: Sleep Medicine

## 2014-02-09 VITALS — Ht 65.0 in | Wt 191.0 lb

## 2014-02-09 DIAGNOSIS — G4730 Sleep apnea, unspecified: Secondary | ICD-10-CM

## 2014-02-09 DIAGNOSIS — Z79899 Other long term (current) drug therapy: Secondary | ICD-10-CM | POA: Insufficient documentation

## 2014-02-09 DIAGNOSIS — G4733 Obstructive sleep apnea (adult) (pediatric): Secondary | ICD-10-CM | POA: Diagnosis not present

## 2014-02-09 DIAGNOSIS — Z794 Long term (current) use of insulin: Secondary | ICD-10-CM | POA: Insufficient documentation

## 2014-02-09 DIAGNOSIS — Z7982 Long term (current) use of aspirin: Secondary | ICD-10-CM | POA: Insufficient documentation

## 2014-02-09 DIAGNOSIS — R0609 Other forms of dyspnea: Secondary | ICD-10-CM | POA: Diagnosis present

## 2014-02-12 NOTE — Sleep Study (Signed)
  [facility] Cindy Fahy A. Gerilyn Pilgrimoonquah, MD     [email or web address]        NOCTURNAL POLYSOMNOGRAM    LOCATION: SLEEP LAB FACILITY: [facility]   PHYSICIAN: Itzayanna Kaster A. Gerilyn Norris, M.D.   DATE OF STUDY: 02/09/2014.   REFERRING PHYSICIAN: Jomari Bartnik.  INDICATIONS: This is a 58 year old presents with loud snoring, hypersomnia and fatigue.  MEDICATIONS:  Prior to Admission medications   Medication Sig Start Date End Date Taking? Authorizing Provider  ALPRAZolam Prudy Feeler(XANAX) 0.5 MG tablet Take 0.5 mg by mouth 4 (four) times daily as needed.      Historical Provider, MD  aspirin 325 MG EC tablet Take 325 mg by mouth daily.      Historical Provider, MD  atenolol (TENORMIN) 25 MG tablet Take 25 mg by mouth 2 (two) times daily.      Historical Provider, MD  atorvastatin (LIPITOR) 80 MG tablet Take 80 mg by mouth daily.      Historical Provider, MD  Diclofenac Sodium CR (VOLTAREN-XR) 100 MG 24 hr tablet Take 150 mg by mouth 2 (two) times daily.      Historical Provider, MD  gabapentin (NEURONTIN) 300 MG capsule Take 300 mg by mouth 3 (three) times daily.      Historical Provider, MD  glyBURIDE-metformin (GLUCOVANCE) 5-500 MG per tablet Take 2 tablets by mouth 2 (two) times daily.      Historical Provider, MD  insulin detemir (LEVEMIR) 100 UNIT/ML injection Inject into the skin as directed.      Historical Provider, MD  Multiple Vitamin (MULTIVITAMIN) tablet Take 1 tablet by mouth daily.      Historical Provider, MD  omeprazole (PRILOSEC) 20 MG capsule Take 20 mg by mouth at bedtime.      Historical Provider, MD  PARoxetine (PAXIL) 40 MG tablet Take 40 mg by mouth at bedtime.      Historical Provider, MD      EPWORTH SLEEPINESS SCALE: 21.   BMI: 31.   ARCHITECTURAL SUMMARY: Total recording time was 396 minutes. Sleep efficiency 86 %. Sleep latency 38 minutes. REM latency Not applicable. Stage NI 2 %, N2 82 % and N3 16 % and REM sleep 0 %.    RESPIRATORY DATA:  Baseline oxygen saturation is  96 %. The lowest saturation is 78 %. The diagnostic AHI is 35. The RDI is 35. The REM AHI is Not applicable.  LIMB MOVEMENT SUMMARY: PLM index 0.   ELECTROCARDIOGRAM SUMMARY: Average heart rate is 81 with no significant dysrhythmias observed.   IMPRESSION:  1. Moderate to severe obstructive sleep apnea syndrome. Patient should be setup for a formal CPAP titration recording or an AutoPAP.  Thanks for this referral.  Sherman Lipuma A. Gerilyn Norris, M.D. Diplomat, Biomedical engineerAmerican Board of Sleep Medicine.

## 2014-09-24 ENCOUNTER — Other Ambulatory Visit: Payer: Self-pay | Admitting: Specialist

## 2014-09-24 DIAGNOSIS — G4452 New daily persistent headache (NDPH): Secondary | ICD-10-CM

## 2014-10-03 ENCOUNTER — Ambulatory Visit
Admission: RE | Admit: 2014-10-03 | Discharge: 2014-10-03 | Disposition: A | Discharge status: Home / Self Care | Payer: Commercial Managed Care - HMO | Source: Ambulatory Visit | Attending: Specialist | Admitting: Specialist

## 2014-10-03 DIAGNOSIS — G4452 New daily persistent headache (NDPH): Secondary | ICD-10-CM

## 2014-11-24 ENCOUNTER — Other Ambulatory Visit: Payer: Self-pay | Admitting: Neurology

## 2014-11-24 DIAGNOSIS — R2681 Unsteadiness on feet: Secondary | ICD-10-CM

## 2014-12-08 ENCOUNTER — Ambulatory Visit (HOSPITAL_COMMUNITY): Payer: Commercial Managed Care - HMO

## 2016-03-28 ENCOUNTER — Other Ambulatory Visit: Payer: Self-pay | Admitting: Neurology

## 2016-03-28 DIAGNOSIS — R4182 Altered mental status, unspecified: Secondary | ICD-10-CM

## 2016-03-28 DIAGNOSIS — R569 Unspecified convulsions: Secondary | ICD-10-CM

## 2016-04-07 ENCOUNTER — Ambulatory Visit (HOSPITAL_COMMUNITY)
Admission: RE | Admit: 2016-04-07 | Discharge: 2016-04-07 | Disposition: A | Discharge status: Home / Self Care | Payer: Medicare HMO | Source: Ambulatory Visit | Attending: Neurology | Admitting: Neurology

## 2016-04-07 DIAGNOSIS — R4182 Altered mental status, unspecified: Secondary | ICD-10-CM | POA: Diagnosis present

## 2016-04-07 DIAGNOSIS — R569 Unspecified convulsions: Secondary | ICD-10-CM | POA: Diagnosis not present

## 2016-04-07 LAB — POCT I-STAT CREATININE: CREATININE: 0.9 mg/dL (ref 0.44–1.00)

## 2016-04-07 MED ORDER — GADOBENATE DIMEGLUMINE 529 MG/ML IV SOLN
16.0000 mL | Freq: Once | INTRAVENOUS | Status: AC | PRN
  Administered 2016-04-07: 16 mL via INTRAVENOUS

## 2017-02-02 DIAGNOSIS — M1990 Unspecified osteoarthritis, unspecified site: Secondary | ICD-10-CM | POA: Diagnosis not present

## 2017-02-02 DIAGNOSIS — G47419 Narcolepsy without cataplexy: Secondary | ICD-10-CM | POA: Diagnosis not present

## 2017-02-02 DIAGNOSIS — Z79899 Other long term (current) drug therapy: Secondary | ICD-10-CM | POA: Diagnosis not present

## 2017-02-02 DIAGNOSIS — M4802 Spinal stenosis, cervical region: Secondary | ICD-10-CM | POA: Diagnosis not present

## 2017-03-03 DIAGNOSIS — Z79899 Other long term (current) drug therapy: Secondary | ICD-10-CM | POA: Diagnosis not present

## 2017-03-03 DIAGNOSIS — G47419 Narcolepsy without cataplexy: Secondary | ICD-10-CM | POA: Diagnosis not present

## 2017-03-03 DIAGNOSIS — M1990 Unspecified osteoarthritis, unspecified site: Secondary | ICD-10-CM | POA: Diagnosis not present

## 2017-03-03 DIAGNOSIS — M4802 Spinal stenosis, cervical region: Secondary | ICD-10-CM | POA: Diagnosis not present

## 2017-04-05 DIAGNOSIS — Z79899 Other long term (current) drug therapy: Secondary | ICD-10-CM | POA: Diagnosis not present

## 2017-04-05 DIAGNOSIS — M79606 Pain in leg, unspecified: Secondary | ICD-10-CM | POA: Diagnosis not present

## 2017-04-05 DIAGNOSIS — G47419 Narcolepsy without cataplexy: Secondary | ICD-10-CM | POA: Diagnosis not present

## 2017-04-05 DIAGNOSIS — M1990 Unspecified osteoarthritis, unspecified site: Secondary | ICD-10-CM | POA: Diagnosis not present

## 2017-04-05 DIAGNOSIS — M4802 Spinal stenosis, cervical region: Secondary | ICD-10-CM | POA: Diagnosis not present

## 2017-04-19 DIAGNOSIS — E119 Type 2 diabetes mellitus without complications: Secondary | ICD-10-CM | POA: Diagnosis not present

## 2017-04-19 DIAGNOSIS — H01009 Unspecified blepharitis unspecified eye, unspecified eyelid: Secondary | ICD-10-CM | POA: Diagnosis not present

## 2017-04-19 DIAGNOSIS — Z7984 Long term (current) use of oral hypoglycemic drugs: Secondary | ICD-10-CM | POA: Diagnosis not present

## 2017-04-19 DIAGNOSIS — H25813 Combined forms of age-related cataract, bilateral: Secondary | ICD-10-CM | POA: Diagnosis not present

## 2017-04-26 DIAGNOSIS — Z791 Long term (current) use of non-steroidal anti-inflammatories (NSAID): Secondary | ICD-10-CM | POA: Diagnosis not present

## 2017-04-26 DIAGNOSIS — Z87891 Personal history of nicotine dependence: Secondary | ICD-10-CM | POA: Diagnosis not present

## 2017-04-26 DIAGNOSIS — I10 Essential (primary) hypertension: Secondary | ICD-10-CM | POA: Diagnosis not present

## 2017-04-26 DIAGNOSIS — E7800 Pure hypercholesterolemia, unspecified: Secondary | ICD-10-CM | POA: Diagnosis not present

## 2017-04-26 DIAGNOSIS — R569 Unspecified convulsions: Secondary | ICD-10-CM | POA: Diagnosis not present

## 2017-04-26 DIAGNOSIS — E119 Type 2 diabetes mellitus without complications: Secondary | ICD-10-CM | POA: Diagnosis not present

## 2017-04-26 DIAGNOSIS — M1990 Unspecified osteoarthritis, unspecified site: Secondary | ICD-10-CM | POA: Diagnosis not present

## 2017-04-26 DIAGNOSIS — M797 Fibromyalgia: Secondary | ICD-10-CM | POA: Diagnosis not present

## 2017-04-26 DIAGNOSIS — K219 Gastro-esophageal reflux disease without esophagitis: Secondary | ICD-10-CM | POA: Diagnosis not present

## 2017-04-26 DIAGNOSIS — Z79899 Other long term (current) drug therapy: Secondary | ICD-10-CM | POA: Diagnosis not present

## 2017-04-26 DIAGNOSIS — Z9114 Patient's other noncompliance with medication regimen: Secondary | ICD-10-CM | POA: Diagnosis not present

## 2017-04-26 DIAGNOSIS — J9809 Other diseases of bronchus, not elsewhere classified: Secondary | ICD-10-CM | POA: Diagnosis not present

## 2017-05-09 DIAGNOSIS — S60221A Contusion of right hand, initial encounter: Secondary | ICD-10-CM | POA: Diagnosis not present

## 2017-05-09 DIAGNOSIS — T148XXA Other injury of unspecified body region, initial encounter: Secondary | ICD-10-CM | POA: Diagnosis not present

## 2017-05-09 DIAGNOSIS — S8001XA Contusion of right knee, initial encounter: Secondary | ICD-10-CM | POA: Diagnosis not present

## 2017-05-09 DIAGNOSIS — E119 Type 2 diabetes mellitus without complications: Secondary | ICD-10-CM | POA: Diagnosis not present

## 2017-05-09 DIAGNOSIS — M79641 Pain in right hand: Secondary | ICD-10-CM | POA: Diagnosis not present

## 2017-05-09 DIAGNOSIS — S40011A Contusion of right shoulder, initial encounter: Secondary | ICD-10-CM | POA: Diagnosis not present

## 2017-05-09 DIAGNOSIS — W19XXXA Unspecified fall, initial encounter: Secondary | ICD-10-CM | POA: Diagnosis not present

## 2017-05-09 DIAGNOSIS — M797 Fibromyalgia: Secondary | ICD-10-CM | POA: Diagnosis not present

## 2017-05-09 DIAGNOSIS — G8929 Other chronic pain: Secondary | ICD-10-CM | POA: Diagnosis not present

## 2017-05-09 DIAGNOSIS — Z87891 Personal history of nicotine dependence: Secondary | ICD-10-CM | POA: Diagnosis not present

## 2017-05-09 DIAGNOSIS — Z79899 Other long term (current) drug therapy: Secondary | ICD-10-CM | POA: Diagnosis not present

## 2017-05-09 DIAGNOSIS — M1990 Unspecified osteoarthritis, unspecified site: Secondary | ICD-10-CM | POA: Diagnosis not present

## 2017-05-09 DIAGNOSIS — S80211A Abrasion, right knee, initial encounter: Secondary | ICD-10-CM | POA: Diagnosis not present

## 2017-05-09 DIAGNOSIS — S6991XA Unspecified injury of right wrist, hand and finger(s), initial encounter: Secondary | ICD-10-CM | POA: Diagnosis not present

## 2017-05-09 DIAGNOSIS — M25561 Pain in right knee: Secondary | ICD-10-CM | POA: Diagnosis not present

## 2017-05-09 DIAGNOSIS — S8991XA Unspecified injury of right lower leg, initial encounter: Secondary | ICD-10-CM | POA: Diagnosis not present

## 2017-05-09 DIAGNOSIS — S4991XA Unspecified injury of right shoulder and upper arm, initial encounter: Secondary | ICD-10-CM | POA: Diagnosis not present

## 2017-05-09 DIAGNOSIS — I10 Essential (primary) hypertension: Secondary | ICD-10-CM | POA: Diagnosis not present

## 2017-05-09 DIAGNOSIS — S90511A Abrasion, right ankle, initial encounter: Secondary | ICD-10-CM | POA: Diagnosis not present

## 2017-05-09 DIAGNOSIS — Z96653 Presence of artificial knee joint, bilateral: Secondary | ICD-10-CM | POA: Diagnosis not present

## 2017-05-09 DIAGNOSIS — M25511 Pain in right shoulder: Secondary | ICD-10-CM | POA: Diagnosis not present

## 2017-05-24 DIAGNOSIS — M797 Fibromyalgia: Secondary | ICD-10-CM | POA: Diagnosis not present

## 2017-05-24 DIAGNOSIS — I10 Essential (primary) hypertension: Secondary | ICD-10-CM | POA: Diagnosis not present

## 2017-05-24 DIAGNOSIS — K219 Gastro-esophageal reflux disease without esophagitis: Secondary | ICD-10-CM | POA: Diagnosis not present

## 2017-06-02 DIAGNOSIS — J0101 Acute recurrent maxillary sinusitis: Secondary | ICD-10-CM | POA: Diagnosis not present

## 2017-06-05 DIAGNOSIS — Z79899 Other long term (current) drug therapy: Secondary | ICD-10-CM | POA: Diagnosis not present

## 2017-06-05 DIAGNOSIS — G47419 Narcolepsy without cataplexy: Secondary | ICD-10-CM | POA: Diagnosis not present

## 2017-06-05 DIAGNOSIS — M1990 Unspecified osteoarthritis, unspecified site: Secondary | ICD-10-CM | POA: Diagnosis not present

## 2017-06-23 DIAGNOSIS — G4733 Obstructive sleep apnea (adult) (pediatric): Secondary | ICD-10-CM | POA: Diagnosis not present

## 2017-06-27 DIAGNOSIS — R569 Unspecified convulsions: Secondary | ICD-10-CM | POA: Diagnosis not present

## 2017-06-27 DIAGNOSIS — L0390 Cellulitis, unspecified: Secondary | ICD-10-CM | POA: Diagnosis not present

## 2017-08-08 DIAGNOSIS — M797 Fibromyalgia: Secondary | ICD-10-CM | POA: Diagnosis not present

## 2017-08-08 DIAGNOSIS — E739 Lactose intolerance, unspecified: Secondary | ICD-10-CM | POA: Diagnosis not present

## 2017-08-08 DIAGNOSIS — I10 Essential (primary) hypertension: Secondary | ICD-10-CM | POA: Diagnosis not present

## 2017-08-08 DIAGNOSIS — K219 Gastro-esophageal reflux disease without esophagitis: Secondary | ICD-10-CM | POA: Diagnosis not present

## 2017-08-08 DIAGNOSIS — E1165 Type 2 diabetes mellitus with hyperglycemia: Secondary | ICD-10-CM | POA: Diagnosis not present

## 2017-08-11 DIAGNOSIS — M797 Fibromyalgia: Secondary | ICD-10-CM | POA: Diagnosis not present

## 2017-08-11 DIAGNOSIS — I10 Essential (primary) hypertension: Secondary | ICD-10-CM | POA: Diagnosis not present

## 2017-08-11 DIAGNOSIS — Z23 Encounter for immunization: Secondary | ICD-10-CM | POA: Diagnosis not present

## 2017-08-11 DIAGNOSIS — Z0001 Encounter for general adult medical examination with abnormal findings: Secondary | ICD-10-CM | POA: Diagnosis not present

## 2017-09-05 DIAGNOSIS — G43719 Chronic migraine without aura, intractable, without status migrainosus: Secondary | ICD-10-CM | POA: Diagnosis not present

## 2017-09-05 DIAGNOSIS — G5600 Carpal tunnel syndrome, unspecified upper limb: Secondary | ICD-10-CM | POA: Diagnosis not present

## 2017-09-05 DIAGNOSIS — E1140 Type 2 diabetes mellitus with diabetic neuropathy, unspecified: Secondary | ICD-10-CM | POA: Diagnosis not present

## 2017-09-07 DIAGNOSIS — G4733 Obstructive sleep apnea (adult) (pediatric): Secondary | ICD-10-CM | POA: Diagnosis not present

## 2017-09-07 DIAGNOSIS — M797 Fibromyalgia: Secondary | ICD-10-CM | POA: Diagnosis not present

## 2017-09-07 DIAGNOSIS — M545 Low back pain: Secondary | ICD-10-CM | POA: Diagnosis not present

## 2017-09-12 DIAGNOSIS — M797 Fibromyalgia: Secondary | ICD-10-CM | POA: Diagnosis not present

## 2017-09-12 DIAGNOSIS — M545 Low back pain: Secondary | ICD-10-CM | POA: Diagnosis not present

## 2017-09-12 DIAGNOSIS — G4733 Obstructive sleep apnea (adult) (pediatric): Secondary | ICD-10-CM | POA: Diagnosis not present

## 2017-09-29 DIAGNOSIS — G4489 Other headache syndrome: Secondary | ICD-10-CM | POA: Diagnosis not present

## 2017-10-04 DIAGNOSIS — G43719 Chronic migraine without aura, intractable, without status migrainosus: Secondary | ICD-10-CM | POA: Diagnosis not present

## 2017-10-04 DIAGNOSIS — E1140 Type 2 diabetes mellitus with diabetic neuropathy, unspecified: Secondary | ICD-10-CM | POA: Diagnosis not present

## 2017-10-07 DIAGNOSIS — M545 Low back pain: Secondary | ICD-10-CM | POA: Diagnosis not present

## 2017-10-08 DIAGNOSIS — M797 Fibromyalgia: Secondary | ICD-10-CM | POA: Diagnosis not present

## 2017-10-08 DIAGNOSIS — Z87891 Personal history of nicotine dependence: Secondary | ICD-10-CM | POA: Diagnosis not present

## 2017-10-08 DIAGNOSIS — W19XXXA Unspecified fall, initial encounter: Secondary | ICD-10-CM | POA: Diagnosis not present

## 2017-10-08 DIAGNOSIS — K219 Gastro-esophageal reflux disease without esophagitis: Secondary | ICD-10-CM | POA: Diagnosis not present

## 2017-10-08 DIAGNOSIS — S20211A Contusion of right front wall of thorax, initial encounter: Secondary | ICD-10-CM | POA: Diagnosis not present

## 2017-10-08 DIAGNOSIS — Z79899 Other long term (current) drug therapy: Secondary | ICD-10-CM | POA: Diagnosis not present

## 2017-10-08 DIAGNOSIS — M1990 Unspecified osteoarthritis, unspecified site: Secondary | ICD-10-CM | POA: Diagnosis not present

## 2017-10-08 DIAGNOSIS — J45909 Unspecified asthma, uncomplicated: Secondary | ICD-10-CM | POA: Diagnosis not present

## 2017-10-08 DIAGNOSIS — I10 Essential (primary) hypertension: Secondary | ICD-10-CM | POA: Diagnosis not present

## 2017-10-08 DIAGNOSIS — E119 Type 2 diabetes mellitus without complications: Secondary | ICD-10-CM | POA: Diagnosis not present

## 2017-10-08 DIAGNOSIS — R0789 Other chest pain: Secondary | ICD-10-CM | POA: Diagnosis not present

## 2017-10-10 DIAGNOSIS — I10 Essential (primary) hypertension: Secondary | ICD-10-CM | POA: Diagnosis not present

## 2017-10-10 DIAGNOSIS — R0781 Pleurodynia: Secondary | ICD-10-CM | POA: Diagnosis not present

## 2017-10-10 DIAGNOSIS — S5002XA Contusion of left elbow, initial encounter: Secondary | ICD-10-CM | POA: Diagnosis not present

## 2017-10-10 DIAGNOSIS — S299XXA Unspecified injury of thorax, initial encounter: Secondary | ICD-10-CM | POA: Diagnosis not present

## 2017-10-10 DIAGNOSIS — M25522 Pain in left elbow: Secondary | ICD-10-CM | POA: Diagnosis not present

## 2017-10-10 DIAGNOSIS — T148XXA Other injury of unspecified body region, initial encounter: Secondary | ICD-10-CM | POA: Diagnosis not present

## 2017-10-10 DIAGNOSIS — M542 Cervicalgia: Secondary | ICD-10-CM | POA: Diagnosis not present

## 2017-10-10 DIAGNOSIS — W010XXA Fall on same level from slipping, tripping and stumbling without subsequent striking against object, initial encounter: Secondary | ICD-10-CM | POA: Diagnosis not present

## 2017-10-10 DIAGNOSIS — Z79899 Other long term (current) drug therapy: Secondary | ICD-10-CM | POA: Diagnosis not present

## 2017-10-10 DIAGNOSIS — S59902A Unspecified injury of left elbow, initial encounter: Secondary | ICD-10-CM | POA: Diagnosis not present

## 2017-10-10 DIAGNOSIS — J45909 Unspecified asthma, uncomplicated: Secondary | ICD-10-CM | POA: Diagnosis not present

## 2017-10-10 DIAGNOSIS — R0789 Other chest pain: Secondary | ICD-10-CM | POA: Diagnosis not present

## 2017-10-10 DIAGNOSIS — E119 Type 2 diabetes mellitus without complications: Secondary | ICD-10-CM | POA: Diagnosis not present

## 2017-10-10 DIAGNOSIS — Z87891 Personal history of nicotine dependence: Secondary | ICD-10-CM | POA: Diagnosis not present

## 2017-10-10 DIAGNOSIS — M1990 Unspecified osteoarthritis, unspecified site: Secondary | ICD-10-CM | POA: Diagnosis not present

## 2017-10-10 DIAGNOSIS — M797 Fibromyalgia: Secondary | ICD-10-CM | POA: Diagnosis not present

## 2017-10-10 DIAGNOSIS — K219 Gastro-esophageal reflux disease without esophagitis: Secondary | ICD-10-CM | POA: Diagnosis not present

## 2017-10-11 DIAGNOSIS — M179 Osteoarthritis of knee, unspecified: Secondary | ICD-10-CM | POA: Diagnosis not present

## 2017-10-11 DIAGNOSIS — Z9181 History of falling: Secondary | ICD-10-CM | POA: Diagnosis not present

## 2017-10-19 DIAGNOSIS — M25522 Pain in left elbow: Secondary | ICD-10-CM | POA: Diagnosis not present

## 2017-10-19 DIAGNOSIS — M545 Low back pain: Secondary | ICD-10-CM | POA: Diagnosis not present

## 2017-10-19 DIAGNOSIS — S20211A Contusion of right front wall of thorax, initial encounter: Secondary | ICD-10-CM | POA: Diagnosis not present

## 2017-11-07 DIAGNOSIS — G40319 Generalized idiopathic epilepsy and epileptic syndromes, intractable, without status epilepticus: Secondary | ICD-10-CM | POA: Diagnosis not present

## 2017-11-07 DIAGNOSIS — F330 Major depressive disorder, recurrent, mild: Secondary | ICD-10-CM | POA: Diagnosis not present

## 2017-11-07 DIAGNOSIS — R51 Headache: Secondary | ICD-10-CM | POA: Diagnosis not present

## 2017-11-07 DIAGNOSIS — S199XXA Unspecified injury of neck, initial encounter: Secondary | ICD-10-CM | POA: Diagnosis not present

## 2017-11-07 DIAGNOSIS — N390 Urinary tract infection, site not specified: Secondary | ICD-10-CM | POA: Diagnosis not present

## 2017-11-07 DIAGNOSIS — W228XXA Striking against or struck by other objects, initial encounter: Secondary | ICD-10-CM | POA: Diagnosis not present

## 2017-11-07 DIAGNOSIS — G4489 Other headache syndrome: Secondary | ICD-10-CM | POA: Diagnosis not present

## 2017-11-07 DIAGNOSIS — K219 Gastro-esophageal reflux disease without esophagitis: Secondary | ICD-10-CM | POA: Diagnosis not present

## 2017-11-07 DIAGNOSIS — R4182 Altered mental status, unspecified: Secondary | ICD-10-CM | POA: Diagnosis not present

## 2017-11-07 DIAGNOSIS — M1990 Unspecified osteoarthritis, unspecified site: Secondary | ICD-10-CM | POA: Diagnosis not present

## 2017-11-07 DIAGNOSIS — S060X0A Concussion without loss of consciousness, initial encounter: Secondary | ICD-10-CM | POA: Diagnosis not present

## 2017-11-07 DIAGNOSIS — G4733 Obstructive sleep apnea (adult) (pediatric): Secondary | ICD-10-CM | POA: Diagnosis not present

## 2017-11-07 DIAGNOSIS — R739 Hyperglycemia, unspecified: Secondary | ICD-10-CM | POA: Diagnosis not present

## 2017-11-07 DIAGNOSIS — Z79899 Other long term (current) drug therapy: Secondary | ICD-10-CM | POA: Diagnosis not present

## 2017-11-07 DIAGNOSIS — M797 Fibromyalgia: Secondary | ICD-10-CM | POA: Diagnosis not present

## 2017-11-07 DIAGNOSIS — R531 Weakness: Secondary | ICD-10-CM | POA: Diagnosis not present

## 2017-11-07 DIAGNOSIS — I10 Essential (primary) hypertension: Secondary | ICD-10-CM | POA: Diagnosis not present

## 2017-11-07 DIAGNOSIS — E119 Type 2 diabetes mellitus without complications: Secondary | ICD-10-CM | POA: Diagnosis not present

## 2017-11-07 DIAGNOSIS — Z96653 Presence of artificial knee joint, bilateral: Secondary | ICD-10-CM | POA: Diagnosis not present

## 2017-11-07 DIAGNOSIS — Z87891 Personal history of nicotine dependence: Secondary | ICD-10-CM | POA: Diagnosis not present

## 2017-11-07 DIAGNOSIS — S0003XA Contusion of scalp, initial encounter: Secondary | ICD-10-CM | POA: Diagnosis not present

## 2017-11-08 DIAGNOSIS — S060X0A Concussion without loss of consciousness, initial encounter: Secondary | ICD-10-CM | POA: Diagnosis not present

## 2017-11-08 DIAGNOSIS — N390 Urinary tract infection, site not specified: Secondary | ICD-10-CM | POA: Diagnosis not present

## 2017-11-08 DIAGNOSIS — R739 Hyperglycemia, unspecified: Secondary | ICD-10-CM | POA: Diagnosis not present

## 2017-11-13 DIAGNOSIS — M797 Fibromyalgia: Secondary | ICD-10-CM | POA: Diagnosis not present

## 2017-11-13 DIAGNOSIS — I10 Essential (primary) hypertension: Secondary | ICD-10-CM | POA: Diagnosis not present

## 2017-11-13 DIAGNOSIS — G40319 Generalized idiopathic epilepsy and epileptic syndromes, intractable, without status epilepticus: Secondary | ICD-10-CM | POA: Diagnosis not present

## 2017-11-13 DIAGNOSIS — E119 Type 2 diabetes mellitus without complications: Secondary | ICD-10-CM | POA: Insufficient documentation

## 2017-11-13 DIAGNOSIS — E1165 Type 2 diabetes mellitus with hyperglycemia: Secondary | ICD-10-CM | POA: Diagnosis not present

## 2017-11-13 DIAGNOSIS — K219 Gastro-esophageal reflux disease without esophagitis: Secondary | ICD-10-CM | POA: Diagnosis not present

## 2017-11-13 DIAGNOSIS — G43009 Migraine without aura, not intractable, without status migrainosus: Secondary | ICD-10-CM | POA: Diagnosis present

## 2017-11-13 DIAGNOSIS — F330 Major depressive disorder, recurrent, mild: Secondary | ICD-10-CM | POA: Diagnosis not present

## 2017-11-16 DIAGNOSIS — R300 Dysuria: Secondary | ICD-10-CM | POA: Diagnosis not present

## 2017-11-29 DIAGNOSIS — G43719 Chronic migraine without aura, intractable, without status migrainosus: Secondary | ICD-10-CM | POA: Diagnosis not present

## 2017-11-29 DIAGNOSIS — G40804 Other epilepsy, intractable, without status epilepticus: Secondary | ICD-10-CM | POA: Diagnosis not present

## 2017-11-29 DIAGNOSIS — E1140 Type 2 diabetes mellitus with diabetic neuropathy, unspecified: Secondary | ICD-10-CM | POA: Diagnosis not present

## 2017-11-29 DIAGNOSIS — Z79891 Long term (current) use of opiate analgesic: Secondary | ICD-10-CM | POA: Diagnosis not present

## 2017-11-29 DIAGNOSIS — G894 Chronic pain syndrome: Secondary | ICD-10-CM | POA: Diagnosis not present

## 2017-11-29 DIAGNOSIS — G47419 Narcolepsy without cataplexy: Secondary | ICD-10-CM | POA: Diagnosis not present

## 2017-12-20 DIAGNOSIS — Z6832 Body mass index (BMI) 32.0-32.9, adult: Secondary | ICD-10-CM | POA: Diagnosis not present

## 2017-12-20 DIAGNOSIS — E1165 Type 2 diabetes mellitus with hyperglycemia: Secondary | ICD-10-CM | POA: Diagnosis not present

## 2017-12-20 DIAGNOSIS — M545 Low back pain: Secondary | ICD-10-CM | POA: Diagnosis not present

## 2017-12-20 DIAGNOSIS — F330 Major depressive disorder, recurrent, mild: Secondary | ICD-10-CM | POA: Diagnosis not present

## 2017-12-25 DIAGNOSIS — R197 Diarrhea, unspecified: Secondary | ICD-10-CM | POA: Diagnosis not present

## 2017-12-25 DIAGNOSIS — R1110 Vomiting, unspecified: Secondary | ICD-10-CM | POA: Diagnosis not present

## 2017-12-25 DIAGNOSIS — Z6832 Body mass index (BMI) 32.0-32.9, adult: Secondary | ICD-10-CM | POA: Diagnosis not present

## 2017-12-26 DIAGNOSIS — G894 Chronic pain syndrome: Secondary | ICD-10-CM | POA: Diagnosis not present

## 2017-12-26 DIAGNOSIS — Z79891 Long term (current) use of opiate analgesic: Secondary | ICD-10-CM | POA: Diagnosis not present

## 2017-12-26 DIAGNOSIS — G47419 Narcolepsy without cataplexy: Secondary | ICD-10-CM | POA: Diagnosis not present

## 2017-12-26 DIAGNOSIS — G43719 Chronic migraine without aura, intractable, without status migrainosus: Secondary | ICD-10-CM | POA: Diagnosis not present

## 2017-12-26 DIAGNOSIS — G40804 Other epilepsy, intractable, without status epilepticus: Secondary | ICD-10-CM | POA: Diagnosis not present

## 2017-12-26 DIAGNOSIS — E1140 Type 2 diabetes mellitus with diabetic neuropathy, unspecified: Secondary | ICD-10-CM | POA: Diagnosis not present

## 2018-01-10 DIAGNOSIS — Z6832 Body mass index (BMI) 32.0-32.9, adult: Secondary | ICD-10-CM | POA: Diagnosis not present

## 2018-01-10 DIAGNOSIS — R197 Diarrhea, unspecified: Secondary | ICD-10-CM | POA: Diagnosis not present

## 2018-01-10 DIAGNOSIS — Z1331 Encounter for screening for depression: Secondary | ICD-10-CM | POA: Diagnosis not present

## 2018-01-10 DIAGNOSIS — F330 Major depressive disorder, recurrent, mild: Secondary | ICD-10-CM | POA: Diagnosis not present

## 2018-01-10 DIAGNOSIS — Z1389 Encounter for screening for other disorder: Secondary | ICD-10-CM | POA: Diagnosis not present

## 2018-01-23 DIAGNOSIS — G47419 Narcolepsy without cataplexy: Secondary | ICD-10-CM | POA: Diagnosis not present

## 2018-01-23 DIAGNOSIS — Z79891 Long term (current) use of opiate analgesic: Secondary | ICD-10-CM | POA: Diagnosis not present

## 2018-01-23 DIAGNOSIS — G43719 Chronic migraine without aura, intractable, without status migrainosus: Secondary | ICD-10-CM | POA: Diagnosis not present

## 2018-01-23 DIAGNOSIS — G894 Chronic pain syndrome: Secondary | ICD-10-CM | POA: Diagnosis not present

## 2018-01-23 DIAGNOSIS — G40804 Other epilepsy, intractable, without status epilepticus: Secondary | ICD-10-CM | POA: Diagnosis not present

## 2018-01-23 DIAGNOSIS — E1140 Type 2 diabetes mellitus with diabetic neuropathy, unspecified: Secondary | ICD-10-CM | POA: Diagnosis not present

## 2018-01-31 DIAGNOSIS — K219 Gastro-esophageal reflux disease without esophagitis: Secondary | ICD-10-CM | POA: Diagnosis not present

## 2018-01-31 DIAGNOSIS — I10 Essential (primary) hypertension: Secondary | ICD-10-CM | POA: Diagnosis not present

## 2018-01-31 DIAGNOSIS — E1165 Type 2 diabetes mellitus with hyperglycemia: Secondary | ICD-10-CM | POA: Diagnosis not present

## 2018-01-31 DIAGNOSIS — M797 Fibromyalgia: Secondary | ICD-10-CM | POA: Diagnosis not present

## 2018-01-31 DIAGNOSIS — M79672 Pain in left foot: Secondary | ICD-10-CM | POA: Diagnosis not present

## 2018-01-31 DIAGNOSIS — Z681 Body mass index (BMI) 19 or less, adult: Secondary | ICD-10-CM | POA: Diagnosis not present

## 2018-01-31 DIAGNOSIS — M545 Low back pain: Secondary | ICD-10-CM | POA: Diagnosis not present

## 2018-01-31 DIAGNOSIS — F419 Anxiety disorder, unspecified: Secondary | ICD-10-CM | POA: Diagnosis not present

## 2018-02-01 DIAGNOSIS — F603 Borderline personality disorder: Secondary | ICD-10-CM | POA: Diagnosis not present

## 2018-02-13 DIAGNOSIS — I10 Essential (primary) hypertension: Secondary | ICD-10-CM | POA: Diagnosis not present

## 2018-02-13 DIAGNOSIS — G43009 Migraine without aura, not intractable, without status migrainosus: Secondary | ICD-10-CM | POA: Diagnosis not present

## 2018-02-13 DIAGNOSIS — G4733 Obstructive sleep apnea (adult) (pediatric): Secondary | ICD-10-CM | POA: Diagnosis not present

## 2018-02-13 DIAGNOSIS — M545 Low back pain: Secondary | ICD-10-CM | POA: Diagnosis not present

## 2018-02-13 DIAGNOSIS — G40319 Generalized idiopathic epilepsy and epileptic syndromes, intractable, without status epilepticus: Secondary | ICD-10-CM | POA: Diagnosis not present

## 2018-02-13 DIAGNOSIS — E1165 Type 2 diabetes mellitus with hyperglycemia: Secondary | ICD-10-CM | POA: Diagnosis not present

## 2018-02-13 DIAGNOSIS — M797 Fibromyalgia: Secondary | ICD-10-CM | POA: Diagnosis not present

## 2018-02-20 DIAGNOSIS — G40804 Other epilepsy, intractable, without status epilepticus: Secondary | ICD-10-CM | POA: Diagnosis not present

## 2018-02-20 DIAGNOSIS — G43719 Chronic migraine without aura, intractable, without status migrainosus: Secondary | ICD-10-CM | POA: Diagnosis not present

## 2018-02-20 DIAGNOSIS — Z79891 Long term (current) use of opiate analgesic: Secondary | ICD-10-CM | POA: Diagnosis not present

## 2018-02-20 DIAGNOSIS — E1140 Type 2 diabetes mellitus with diabetic neuropathy, unspecified: Secondary | ICD-10-CM | POA: Diagnosis not present

## 2018-02-20 DIAGNOSIS — G47419 Narcolepsy without cataplexy: Secondary | ICD-10-CM | POA: Diagnosis not present

## 2018-02-20 DIAGNOSIS — G894 Chronic pain syndrome: Secondary | ICD-10-CM | POA: Diagnosis not present

## 2018-03-08 DIAGNOSIS — N6320 Unspecified lump in the left breast, unspecified quadrant: Secondary | ICD-10-CM | POA: Diagnosis not present

## 2018-03-08 DIAGNOSIS — N952 Postmenopausal atrophic vaginitis: Secondary | ICD-10-CM | POA: Diagnosis not present

## 2018-03-08 DIAGNOSIS — R102 Pelvic and perineal pain: Secondary | ICD-10-CM | POA: Diagnosis not present

## 2018-03-08 DIAGNOSIS — Z01411 Encounter for gynecological examination (general) (routine) with abnormal findings: Secondary | ICD-10-CM | POA: Diagnosis not present

## 2018-03-08 DIAGNOSIS — Z01419 Encounter for gynecological examination (general) (routine) without abnormal findings: Secondary | ICD-10-CM | POA: Diagnosis not present

## 2018-03-23 DIAGNOSIS — Z79891 Long term (current) use of opiate analgesic: Secondary | ICD-10-CM | POA: Diagnosis not present

## 2018-03-23 DIAGNOSIS — G894 Chronic pain syndrome: Secondary | ICD-10-CM | POA: Diagnosis not present

## 2018-03-23 DIAGNOSIS — G5600 Carpal tunnel syndrome, unspecified upper limb: Secondary | ICD-10-CM | POA: Diagnosis not present

## 2018-03-28 DIAGNOSIS — N6489 Other specified disorders of breast: Secondary | ICD-10-CM | POA: Diagnosis not present

## 2018-03-28 DIAGNOSIS — R921 Mammographic calcification found on diagnostic imaging of breast: Secondary | ICD-10-CM | POA: Diagnosis not present

## 2018-04-04 ENCOUNTER — Other Ambulatory Visit: Payer: Self-pay

## 2018-04-04 ENCOUNTER — Ambulatory Visit (HOSPITAL_COMMUNITY)
Admission: RE | Admit: 2018-04-04 | Discharge: 2018-04-04 | Disposition: A | Discharge status: Home / Self Care | Payer: Medicare HMO | Source: Home / Self Care | Attending: Psychiatry | Admitting: Psychiatry

## 2018-04-04 ENCOUNTER — Emergency Department (HOSPITAL_COMMUNITY)
Admission: EM | Admit: 2018-04-04 | Discharge: 2018-04-04 | Disposition: A | Discharge status: Home / Self Care | Payer: Medicare HMO | Attending: Emergency Medicine | Admitting: Emergency Medicine

## 2018-04-04 ENCOUNTER — Emergency Department (HOSPITAL_COMMUNITY): Payer: Medicare HMO

## 2018-04-04 ENCOUNTER — Encounter (HOSPITAL_COMMUNITY): Payer: Self-pay | Admitting: *Deleted

## 2018-04-04 DIAGNOSIS — R45851 Suicidal ideations: Secondary | ICD-10-CM | POA: Diagnosis not present

## 2018-04-04 DIAGNOSIS — S199XXA Unspecified injury of neck, initial encounter: Secondary | ICD-10-CM | POA: Diagnosis not present

## 2018-04-04 DIAGNOSIS — S0990XA Unspecified injury of head, initial encounter: Secondary | ICD-10-CM | POA: Diagnosis not present

## 2018-04-04 DIAGNOSIS — F332 Major depressive disorder, recurrent severe without psychotic features: Secondary | ICD-10-CM | POA: Insufficient documentation

## 2018-04-04 DIAGNOSIS — Z049 Encounter for examination and observation for unspecified reason: Secondary | ICD-10-CM

## 2018-04-04 DIAGNOSIS — E119 Type 2 diabetes mellitus without complications: Secondary | ICD-10-CM | POA: Diagnosis not present

## 2018-04-04 DIAGNOSIS — Z7984 Long term (current) use of oral hypoglycemic drugs: Secondary | ICD-10-CM | POA: Insufficient documentation

## 2018-04-04 DIAGNOSIS — I10 Essential (primary) hypertension: Secondary | ICD-10-CM | POA: Diagnosis not present

## 2018-04-04 DIAGNOSIS — R42 Dizziness and giddiness: Secondary | ICD-10-CM | POA: Diagnosis not present

## 2018-04-04 DIAGNOSIS — Z79899 Other long term (current) drug therapy: Secondary | ICD-10-CM | POA: Diagnosis not present

## 2018-04-04 DIAGNOSIS — R51 Headache: Secondary | ICD-10-CM | POA: Diagnosis not present

## 2018-04-04 DIAGNOSIS — R569 Unspecified convulsions: Secondary | ICD-10-CM | POA: Diagnosis not present

## 2018-04-04 HISTORY — DX: Migraine, unspecified, not intractable, without status migrainosus: G43.909

## 2018-04-04 HISTORY — DX: Unspecified convulsions: R56.9

## 2018-04-04 HISTORY — DX: Narcolepsy without cataplexy: G47.419

## 2018-04-04 HISTORY — DX: Sleep apnea, unspecified: G47.30

## 2018-04-04 LAB — URINALYSIS, ROUTINE W REFLEX MICROSCOPIC
BILIRUBIN URINE: NEGATIVE
Glucose, UA: NEGATIVE mg/dL
Hgb urine dipstick: NEGATIVE
KETONES UR: NEGATIVE mg/dL
Nitrite: NEGATIVE
PH: 5 (ref 5.0–8.0)
PROTEIN: NEGATIVE mg/dL
Specific Gravity, Urine: 1.024 (ref 1.005–1.030)

## 2018-04-04 LAB — COMPREHENSIVE METABOLIC PANEL
ALK PHOS: 106 U/L (ref 38–126)
ALT: 29 U/L (ref 0–44)
AST: 21 U/L (ref 15–41)
Albumin: 4 g/dL (ref 3.5–5.0)
Anion gap: 8 (ref 5–15)
BUN: 14 mg/dL (ref 8–23)
CALCIUM: 9.4 mg/dL (ref 8.9–10.3)
CHLORIDE: 111 mmol/L (ref 98–111)
CO2: 25 mmol/L (ref 22–32)
CREATININE: 0.63 mg/dL (ref 0.44–1.00)
Glucose, Bld: 118 mg/dL — ABNORMAL HIGH (ref 70–99)
Potassium: 3.3 mmol/L — ABNORMAL LOW (ref 3.5–5.1)
Sodium: 144 mmol/L (ref 135–145)
Total Bilirubin: 0.3 mg/dL (ref 0.3–1.2)
Total Protein: 6.9 g/dL (ref 6.5–8.1)

## 2018-04-04 LAB — RAPID URINE DRUG SCREEN, HOSP PERFORMED
Amphetamines: NOT DETECTED
Benzodiazepines: NOT DETECTED
Cocaine: NOT DETECTED
OPIATES: NOT DETECTED
Tetrahydrocannabinol: NOT DETECTED

## 2018-04-04 LAB — CBG MONITORING, ED
GLUCOSE-CAPILLARY: 97 mg/dL (ref 70–99)
GLUCOSE-CAPILLARY: 126 mg/dL — AB (ref 70–99)

## 2018-04-04 LAB — CBC
HCT: 40.5 % (ref 36.0–46.0)
Hemoglobin: 12.7 g/dL (ref 12.0–15.0)
MCH: 28.3 pg (ref 26.0–34.0)
MCHC: 31.4 g/dL (ref 30.0–36.0)
MCV: 90.4 fL (ref 78.0–100.0)
PLATELETS: 288 10*3/uL (ref 150–400)
RBC: 4.48 MIL/uL (ref 3.87–5.11)
RDW: 12.8 % (ref 11.5–15.5)
WBC: 7.5 10*3/uL (ref 4.0–10.5)

## 2018-04-04 LAB — SALICYLATE LEVEL

## 2018-04-04 LAB — ACETAMINOPHEN LEVEL: Acetaminophen (Tylenol), Serum: <10 ug/mL — ABNORMAL LOW (ref 10–30)

## 2018-04-04 LAB — ETHANOL

## 2018-04-04 MED ORDER — CITALOPRAM HYDROBROMIDE 10 MG PO TABS
10.0000 mg | ORAL_TABLET | Freq: Every day | ORAL | Status: DC
  Administered 2018-04-04: 10 mg via ORAL
  Filled 2018-04-04: qty 1

## 2018-04-04 MED ORDER — TOPIRAMATE 100 MG PO TABS
100.0000 mg | ORAL_TABLET | Freq: Two times a day (BID) | ORAL | Status: DC
  Administered 2018-04-04: 100 mg via ORAL
  Filled 2018-04-04: qty 1

## 2018-04-04 MED ORDER — OXYBUTYNIN CHLORIDE 5 MG PO TABS
5.0000 mg | ORAL_TABLET | Freq: Three times a day (TID) | ORAL | Status: DC
  Administered 2018-04-04 (×2): 5 mg via ORAL
  Filled 2018-04-04 (×2): qty 1

## 2018-04-04 MED ORDER — METFORMIN HCL ER 500 MG PO TB24
500.0000 mg | ORAL_TABLET | Freq: Two times a day (BID) | ORAL | Status: DC
  Administered 2018-04-04 (×2): 500 mg via ORAL
  Filled 2018-04-04 (×2): qty 1

## 2018-04-04 MED ORDER — NORTRIPTYLINE HCL 25 MG PO CAPS
25.0000 mg | ORAL_CAPSULE | Freq: Every day | ORAL | Status: DC
  Filled 2018-04-04: qty 1

## 2018-04-04 MED ORDER — DULAGLUTIDE 0.75 MG/0.5ML ~~LOC~~ SOAJ: 0.7500 mg | SUBCUTANEOUS | Status: DC

## 2018-04-04 MED ORDER — ATORVASTATIN CALCIUM 80 MG PO TABS
80.0000 mg | ORAL_TABLET | Freq: Every day | ORAL | Status: DC
  Filled 2018-04-04 (×2): qty 1

## 2018-04-04 MED ORDER — GABAPENTIN 400 MG PO CAPS
800.0000 mg | ORAL_CAPSULE | Freq: Four times a day (QID) | ORAL | Status: DC
  Administered 2018-04-04 (×3): 800 mg via ORAL
  Filled 2018-04-04 (×3): qty 2

## 2018-04-04 MED ORDER — SUMATRIPTAN SUCCINATE 50 MG PO TABS
50.0000 mg | ORAL_TABLET | ORAL | Status: DC | PRN
  Filled 2018-04-04: qty 2

## 2018-04-04 MED ORDER — LEVETIRACETAM 500 MG PO TABS
1000.0000 mg | ORAL_TABLET | Freq: Two times a day (BID) | ORAL | Status: DC
  Administered 2018-04-04: 1000 mg via ORAL
  Filled 2018-04-04: qty 2

## 2018-04-04 MED ORDER — HYDROXYZINE HCL 25 MG PO TABS: 25.0000 mg | ORAL_TABLET | Freq: Four times a day (QID) | ORAL | Status: DC | PRN

## 2018-04-04 NOTE — Discharge Instructions (Signed)
For your behavioral health needs, you are advised to continue treatment with [facility]: ° °     [facility] °     [address] °     [place], [place] [address] °     [phone] °

## 2018-04-04 NOTE — ED Notes (Signed)
Bed: WLPT3 Expected date:  Expected time:  Means of arrival:  Comments: 

## 2018-04-04 NOTE — ED Notes (Signed)
Patient belongings have been removed, changed in to scrubs, security to bedside to wand the patient. I have called pharmacy tech and request that she come and reconcile the patients medications and have asked patients partner to take the medications home with her. I have also reviewed the visitor policy with her. Pt will maintain her cane and pair of glasses with her.

## 2018-04-04 NOTE — ED Provider Notes (Signed)
Patient seen/examined in the Emergency Department in conjunction with Midlevel Provider McDonald Patient presents for medical clearance for behavioral health.  She had a recent seizure, now reports dizziness after seizure.  She is also reporting suicidal ideation Exam : Awake alert, mild tremor noted, no arm or leg drift, no past-pointing Plan: CT head negative.  Labs pending.  Anticipate transfer to behavioral health I have low suspicion for acute stroke at this time   Zadie RhineWickline, Trayton Szabo, MD 04/04/18 [phone]

## 2018-04-04 NOTE — ED Notes (Signed)
Bed: WA27 Expected date:  Expected time:  Means of arrival:  Comments: 

## 2018-04-04 NOTE — BH Assessment (Signed)
[facility]  Per Juanetta BeetsJacqueline Norman, DO, this voluntary pt requires psychiatric hospitalization at this time.  The following facilities have been contacted to seek placement for this pt, with results as noted:  Beds available, information sent, decision pending:  [facility]   [facility]:  [facility] ([facility]) [facility]   [facility] Rosha Cocker, [place] [facility] Coordinator [phone]

## 2018-04-04 NOTE — H&P (Signed)
Behavioral Health Medical Screening Exam  Cindy Norris is an 62 y.o. female who [facility]. Reports suicidal ideations with a plan to overdose. Reports that she has had suicidal thoughts in the past, but today was the first time that she has had a serious plan. Reports that her last seizure was yesterday.   Total Time spent with patient: 20 minutes  Psychiatric Specialty Exam: Physical Exam  Constitutional: She is oriented to person, place, and time. She appears well-developed and well-nourished. No distress.  HENT:  Head: Normocephalic and atraumatic.  Right Ear: External ear normal.  Left Ear: External ear normal.  Eyes: Conjunctivae are normal.  Respiratory: Effort normal. No respiratory distress.  Musculoskeletal: Normal range of motion.  Neurological: She is alert and oriented to person, place, and time.  Skin: Skin is warm and dry. She is not diaphoretic.  Psychiatric: Her speech is normal. Her mood appears anxious. She is not withdrawn and not actively hallucinating. Thought content is not paranoid and not delusional. Cognition and memory are normal. She expresses impulsivity and inappropriate judgment. She exhibits a depressed mood. She expresses suicidal ideation. She expresses no homicidal ideation. She expresses suicidal plans.    Review of Systems  Constitutional: Negative for chills, fever and weight loss.  Respiratory: Negative for shortness of breath.   Cardiovascular: Negative for chest pain.  Neurological: Positive for seizures.  Psychiatric/Behavioral: Positive for depression and suicidal ideas. Negative for hallucinations, memory loss and substance abuse. The patient is nervous/anxious and has insomnia.     Blood pressure 127/64, pulse 62, resp. rate 16, SpO2 99 %.There is no height or weight on file to calculate BMI.  General Appearance: Casual and Fairly Groomed  Eye Contact:  Good  Speech:  Clear and Coherent and Normal Rate  Volume:  Decreased   Mood:  Anxious, Depressed, Dysphoric, Hopeless and Worthless  Affect:  Congruent, Depressed and Tearful  Thought Process:  Coherent, Goal Directed and Descriptions of Associations: Intact  Orientation:  Full (Time, Place, and Person)  Thought Content:  Logical and Hallucinations: None  Suicidal Thoughts:  Yes.  with intent/plan  Homicidal Thoughts:  No  Memory:  Immediate;   Fair Recent;   Fair  Judgement:  Impaired  Insight:  Lacking  Psychomotor Activity:  Normal  Concentration: Concentration: Fair and Attention Span: Fair  Recall:  Good  Fund of Knowledge:Good  Language: Good  Akathisia:  No  Handed:  Right  AIMS (if indicated):     Assets:  Communication Skills Desire for Improvement Financial Resources/Insurance Housing Intimacy Leisure Time Transportation  Sleep:       Musculoskeletal: Strength & Muscle Tone: within normal limits Gait & Station: normal   Blood pressure 127/64, pulse 62, resp. rate 16, SpO2 99 %.  Recommendations:  Based on my evaluation the patient does not appear to have an emergency medical condition.  Due to patient's medical history and recent seizure [facility]. Recommend psychiatric Inpatient admission when medically cleared.  Jackelyn PolingJason A Etoile Looman, NP 04/04/2018, 2:19 AM

## 2018-04-04 NOTE — BH Assessment (Signed)
[facility]  Per Juanetta BeetsJacqueline Norman, DO, this pt would benefit from psychiatric hospitalization at this time, but does not meet criteria for IVC.  At 16:15 this Clinical research associatewriter received a call from Georjean [facility]aron [facility] offering [facility] bed for pt's [facility].  I then spoke to pt, who does not want to be tranferred to this facility.  I reported to pt that if she is a danger to herself or others, a hospital is the best place for her to be, but that if she is not a danger to herself or others, she does not need to be hospitalized and can be discharged.  Pt reports that she is not a danger to herself or others.  This was staffed with Nanine MeansJamison Lord, DNP and with EDP Linwood DibblesJon Knapp, MD, and it has been decided that pt is to [facility].  Discharge instructions advise pt to [facility] [place].  Pt's nurse, Addison NaegeliDekina, has been notified.  Georjean ModeSharon Summey has been notified that pt does not need the bed that was offered.  Doylene Canninghomas Jayvian Escoe, MA Triage Specialist [phone]

## 2018-04-04 NOTE — ED Provider Notes (Signed)
[facility]-EMERGENCY DEPT Provider Note   CSN: [id number] Arrival date & time: 04/04/18  0236     History   Chief Complaint Chief Complaint  Patient presents with  . Suicidal    HPI Cindy Norris is a 62 y.o. female with a history of seizures, depression, anxiety, diabetes mellitus, GERD, hyperlipidemia, narcolepsy, osteoarthritis, OSA, and hypertension who presents to the emergency department from [facility] with a chief complaint of medical clearance.  The patient reports depressed mood that has been going on for some time, but reports that she began to develop a suicidal ideation with plan to overdose on her home medications.  She reports 1 previous suicide attempt when she was a teenager.  She denies HI and auditory or visual hallucinations.  She reports that she had a seizure 2 days ago that was witnessed by her son.  He is not available to provide any of the details.  She reports that she woke up in the floor and denies any lacerations to the lips or tongue.  She is endorsing a headache and worsening dizziness since the seizure.  She is followed by Dr. Gerilyn Pilgrim with neurosurgery and was last seen 1 month ago.  No recent missed doses of her home antiepileptic medications.  She reports that in the past that she has had increased seizure activity due to worsening stress and if she gets overheated.  She states that she was feeling very warm and overheated prior to her seizure 2 days ago.  She reports that she intermittently ambulates with a cane at baseline.  She denies fever, chills, recent illnesses, nausea, vomiting, diarrhea, chest pain, dyspnea, diplopia, slurred speech, or other medical complaints at this time.  She denies alcohol use.  No IV recreational drug use.  The history is provided by the patient. No language interpreter was used.    Past Medical History:  Diagnosis Date  . Chronic pain syndrome   . Depression   . Diabetes mellitus   . GERD  (gastroesophageal reflux disease)   . Hyperlipidemia, mixed   . Migraines   . Narcolepsy   . Osteoarthritis   . Overweight(278.02)    Obesity  . Seizures (HCC)   . Sleep apnea   . Unspecified essential hypertension     Patient Active Problem List   Diagnosis Date Noted  . HYPERLIPIDEMIA-MIXED 06/02/2009  . OVERWEIGHT/OBESITY 06/02/2009  . HYPERTENSION, UNSPECIFIED 06/02/2009    Past Surgical History:  Procedure Laterality Date  . KNEE ARTHROSCOPY    . MASTECTOMY     Right  . PILONIDAL CYST EXCISION    . TOTAL KNEE ARTHROPLASTY       OB History   None      Home Medications    Prior to Admission medications   Medication Sig Start Date End Date Taking? Authorizing Provider  atorvastatin (LIPITOR) 80 MG tablet Take 80 mg by mouth daily.     Yes [provider]  busPIRone (BUSPAR) 10 MG tablet Take 10 mg by mouth 2 (two) times daily.   Yes [provider]  conjugated estrogens (PREMARIN) vaginal cream Place 1 Applicatorful vaginally 2 (two) times a week.  03/08/18 03/08/19 Yes [provider]  diclofenac sodium (VOLTAREN) 1 % GEL Apply 2 g topically 4 (four) times daily as needed (pain).   Yes [provider]  Dulaglutide (TRULICITY) 0.75 MG/0.5ML SOPN Inject 0.75 mg into the skin every Wednesday.  01/03/18 05/02/18 Yes [provider]  gabapentin (NEURONTIN) 800 MG tablet  Take 800 mg by mouth 4 (four) times daily.   Yes [provider]  HYDROcodone-acetaminophen (NORCO/VICODIN) 5-325 MG tablet Take 1 tablet by mouth daily as needed for moderate pain.   Yes [provider]  hydroxypropyl methylcellulose / hypromellose (ISOPTO TEARS / GONIOVISC) 2.5 % ophthalmic solution Place 1 drop into both eyes 3 (three) times daily as needed for dry eyes.   Yes [provider]  hydrOXYzine (ATARAX/VISTARIL) 25 MG tablet Take 25 mg by mouth 3 (three) times daily as needed for anxiety.   Yes [provider]    ibuprofen (ADVIL,MOTRIN) 800 MG tablet Take 800 mg by mouth every 8 (eight) hours as needed for moderate pain.   Yes [provider]  levETIRAcetam (KEPPRA) 1000 MG tablet Take 1,000 mg by mouth 2 (two) times daily.   Yes [provider]  metFORMIN (GLUCOPHAGE-XR) 500 MG 24 hr tablet Take 500 mg by mouth 2 (two) times daily.   Yes [provider]  modafinil (PROVIGIL) 200 MG tablet Take 400 mg by mouth daily.   Yes [provider]  Multiple Vitamin (MULTIVITAMIN) tablet Take 1 tablet by mouth daily.     Yes [provider]  nortriptyline (PAMELOR) 25 MG capsule Take 25 mg by mouth at bedtime.   Yes [provider]  nystatin cream (MYCOSTATIN) Apply 1 application topically 2 (two) times daily as needed for dry skin.   Yes [provider]  omeprazole (PRILOSEC) 20 MG capsule Take 20 mg by mouth daily.    Yes [provider]  oxybutynin (DITROPAN) 5 MG tablet Take 5 mg by mouth 3 (three) times daily.   Yes [provider]  potassium chloride SA (K-DUR,KLOR-CON) 20 MEQ tablet Take 20 mEq by mouth 2 (two) times daily.   Yes [provider]  SUMAtriptan (IMITREX) 50 MG tablet Take 50-100 mg by mouth every 2 (two) hours as needed for migraine. May repeat in 2 hours if headache persists or recurs.   Yes [provider]  topiramate (TOPAMAX) 100 MG tablet Take 100 mg by mouth 2 (two) times daily.   Yes [provider]  venlafaxine XR (EFFEXOR-XR) 150 MG 24 hr capsule Take 150 mg by mouth 2 (two) times daily.   Yes [provider]  atenolol (TENORMIN) 25 MG tablet Take 25 mg by mouth 2 (two) times daily.      [provider]    Family History Family History  Problem Relation Age of Onset  . Coronary artery disease Other     Social History Social History   Tobacco Use  . Smoking status: Unknown If Ever Smoked  . Smokeless tobacco: Never Used  Substance Use Topics  .  Alcohol use: No  . Drug use: Never     Allergies   Azithromycin; Other; Tape; Erythromycin; Hydromorphone hcl; Lyrica [pregabalin]; Penicillins; Reglan [metoclopramide]; and Sulfonamide derivatives   Review of Systems Review of Systems  Constitutional: Negative for activity change, chills, diaphoresis and fever.  HENT: Negative for congestion.   Eyes: Positive for photophobia and visual disturbance (blurred).  Respiratory: Negative for shortness of breath.   Cardiovascular: Negative for chest pain.  Gastrointestinal: Negative for abdominal pain, diarrhea, nausea and vomiting.  Genitourinary: Negative for dysuria.  Musculoskeletal: Negative for back pain.  Skin: Negative for rash.  Allergic/Immunologic: Negative for immunocompromised state.  Neurological: Positive for dizziness, seizures and headaches. Negative for syncope, facial asymmetry, weakness, light-headedness and numbness.  Psychiatric/Behavioral: Positive for dysphoric mood and suicidal ideas.  Negative for confusion and hallucinations. The patient is not nervous/anxious.    Physical Exam Updated Vital Signs BP 137/64 (BP Location: Left Arm)   Pulse (!) 57   Temp 98.3 F (36.8 C) (Oral)   Resp 16   Ht 5\' 6"  (1.676 m)   Wt 86.6 kg (191 lb)   SpO2 100%   BMI 30.83 kg/m   Physical Exam  Constitutional: No distress.  Chronically ill-appearing female.  HENT:  Head: Normocephalic.  Eyes: Pupils are equal, round, and reactive to light. EOM are normal. Right conjunctiva is injected. Left conjunctiva is injected.  Neck: Neck supple.  Cardiovascular: Normal rate, regular rhythm, normal heart sounds and intact distal pulses. Exam reveals no gallop and no friction rub.  No murmur heard. Pulmonary/Chest: Effort normal. No stridor. No respiratory distress. She has no wheezes. She has no rales. She exhibits no tenderness.  Abdominal: Soft. She exhibits no distension and no mass. There is no tenderness. There is no rebound and  no guarding. No hernia.  Neurological: She is alert.  GCS 15.  Alert and oriented x3.  5 out of 5 strength against resistance to bilateral upper and lower extremities.  Cranial nerves II through XII are grossly intact.  Sensation is intact and symmetric throughout.  Mild ataxia with ambulation.  Mild ataxia with Romberg.  Finger-to-nose is intact bilaterally, but left side is minimally less smooth than the right side.  No slurred speech.  Skin: Skin is warm. No rash noted.  Psychiatric: Her speech is delayed. She is slowed and withdrawn. She is not actively hallucinating. Cognition and memory are normal. She exhibits a depressed mood. She expresses suicidal ideation. She expresses no homicidal ideation. She expresses suicidal plans. She expresses no homicidal plans.  Nursing note and vitals reviewed.  ED Treatments / Results  Labs (all labs ordered are listed, but only abnormal results are displayed) Labs Reviewed  COMPREHENSIVE METABOLIC PANEL - Abnormal; Notable for the following components:      Result Value   Potassium 3.3 (*)    Glucose, Bld 118 (*)    All other components within normal limits  ACETAMINOPHEN LEVEL - Abnormal; Notable for the following components:   Acetaminophen (Tylenol), Serum <10 (*)    All other components within normal limits  RAPID URINE DRUG SCREEN, HOSP PERFORMED - Abnormal; Notable for the following components:   Barbiturates   (*)    Value: Result not available. Reagent lot number recalled by manufacturer.   All other components within normal limits  ETHANOL  SALICYLATE LEVEL  CBC    EKG None  Radiology Ct Head Wo Contrast  Result Date: 04/04/2018 CLINICAL DATA:  Seizure April 02, 2018, struck head. Headache. History of seizures, narcolepsy, hypertension, migraines. EXAM: CT HEAD WITHOUT CONTRAST CT CERVICAL SPINE WITHOUT CONTRAST TECHNIQUE: Multidetector CT imaging of the head and cervical spine was performed following the standard protocol without  intravenous contrast. Multiplanar CT image reconstructions of the cervical spine were also generated. COMPARISON:  CT HEAD November 07, 2017 FINDINGS: CT HEAD FINDINGS BRAIN: No intraparenchymal hemorrhage, mass effect nor midline shift. The ventricles and sulci are normal for age. Minimal supratentorial white matter hypodensities within normal range for patient's age, though non-specific are most compatible with chronic small vessel ischemic disease. No acute large vascular territory infarcts. No abnormal extra-axial fluid collections. Basal cisterns are patent. VASCULAR: Mild calcific atherosclerosis of the carotid siphons and vertebral arteries. SKULL: No skull fracture. No significant scalp soft tissue swelling. SINUSES/ORBITS: The mastoid  air-cells and included paranasal sinuses are well-aerated.The included ocular globes and orbital contents are non-suspicious. OTHER: None. CT CERVICAL SPINE FINDINGS ALIGNMENT: Maintained lordosis. Vertebral bodies in alignment. SKULL BASE AND VERTEBRAE: Cervical vertebral bodies and posterior elements are intact. Small RIGHT C7 rib. Mild global T1 compression fracture. Moderate C5-6 disc height loss, mild at C3-4 with endplate spurring compatible with degenerative discs. No destructive bony lesions. C1-2 articulation maintained. SOFT TISSUES AND SPINAL CANAL: Normal. DISC LEVELS: No significant osseous canal stenosis. Mild RIGHT C3-4 neural foraminal narrowing. UPPER CHEST: Lung apices are clear. OTHER: None. IMPRESSION: CT HEAD: 1. Negative non-contrast CT HEAD for age. CT CERVICAL SPINE: 1. No fracture or malalignment. Electronically Signed   By: Awilda Metro M.D.   On: 04/04/2018 06:25   Ct Cervical Spine Wo Contrast  Result Date: 04/04/2018 CLINICAL DATA:  Seizure April 02, 2018, struck head. Headache. History of seizures, narcolepsy, hypertension, migraines. EXAM: CT HEAD WITHOUT CONTRAST CT CERVICAL SPINE WITHOUT CONTRAST TECHNIQUE: Multidetector CT imaging of  the head and cervical spine was performed following the standard protocol without intravenous contrast. Multiplanar CT image reconstructions of the cervical spine were also generated. COMPARISON:  CT HEAD November 07, 2017 FINDINGS: CT HEAD FINDINGS BRAIN: No intraparenchymal hemorrhage, mass effect nor midline shift. The ventricles and sulci are normal for age. Minimal supratentorial white matter hypodensities within normal range for patient's age, though non-specific are most compatible with chronic small vessel ischemic disease. No acute large vascular territory infarcts. No abnormal extra-axial fluid collections. Basal cisterns are patent. VASCULAR: Mild calcific atherosclerosis of the carotid siphons and vertebral arteries. SKULL: No skull fracture. No significant scalp soft tissue swelling. SINUSES/ORBITS: The mastoid air-cells and included paranasal sinuses are well-aerated.The included ocular globes and orbital contents are non-suspicious. OTHER: None. CT CERVICAL SPINE FINDINGS ALIGNMENT: Maintained lordosis. Vertebral bodies in alignment. SKULL BASE AND VERTEBRAE: Cervical vertebral bodies and posterior elements are intact. Small RIGHT C7 rib. Mild global T1 compression fracture. Moderate C5-6 disc height loss, mild at C3-4 with endplate spurring compatible with degenerative discs. No destructive bony lesions. C1-2 articulation maintained. SOFT TISSUES AND SPINAL CANAL: Normal. DISC LEVELS: No significant osseous canal stenosis. Mild RIGHT C3-4 neural foraminal narrowing. UPPER CHEST: Lung apices are clear. OTHER: None. IMPRESSION: CT HEAD: 1. Negative non-contrast CT HEAD for age. CT CERVICAL SPINE: 1. No fracture or malalignment. Electronically Signed   By: Awilda Metro M.D.   On: 04/04/2018 06:25    Procedures Procedures (including critical care time)  Medications Ordered in ED Medications  atorvastatin (LIPITOR) tablet 80 mg (has no administration in time range)  Dulaglutide SOPN 0.75 mg  (has no administration in time range)  gabapentin (NEURONTIN) capsule 800 mg (has no administration in time range)  levETIRAcetam (KEPPRA) tablet 1,000 mg (has no administration in time range)  metFORMIN (GLUCOPHAGE-XR) 24 hr tablet 500 mg (has no administration in time range)  nortriptyline (PAMELOR) capsule 25 mg (has no administration in time range)  oxybutynin (DITROPAN) tablet 5 mg (has no administration in time range)  SUMAtriptan (IMITREX) tablet 50-100 mg (has no administration in time range)  topiramate (TOPAMAX) tablet 100 mg (has no administration in time range)     Initial Impression / Assessment and Plan / ED Course  I have reviewed the triage vital signs and the nursing notes.  Pertinent labs & imaging results that were available during my care of the patient were reviewed by me and considered in my medical decision making (see chart for details).  62 year old female with a history of seizures, depression, anxiety, diabetes mellitus, GERD, hyperlipidemia, narcolepsy, osteoarthritis, OSA, and hypertension presenting from [facility] with suicidal ideation and worsening depressed mood.  She has been compliant with her home antiepileptics, but had a seizure at home 2 days ago with a headache and worse than baseline dizziness since the seizure.  The patient was seen and evaluated along with Dr. Bebe Shaggy, attending physician.  The seizure was witnessed by her son who has been unavailable to answer any questions about the episode.  CT head and cervical spine are negative, there is a T1 compression fracture.  Radiology does not indicate whether this is acute or subacute.  No other electrolyte abnormalities on labs.  Suspect increased dizziness is medication induced.  Doubt CVA.  Labs are otherwise reassuring.  The patient is voluntary.  Pt medically cleared at this time. Psych hold orders and home med orders placed. TTS consult pending; please see psych team notes for further documentation of  care/dispo. Pt stable at time of med clearance.     Final Clinical Impressions(s) / ED Diagnoses   Final diagnoses:  Suicidal ideation  Severe episode of recurrent major depressive disorder, without psychotic features [facility])    ED Discharge Orders    None       Kortnee Bas A, PA-C 04/04/18 1610    Zadie Rhine, MD 04/04/18 2332

## 2018-04-04 NOTE — ED Triage Notes (Signed)
[facility], has already been evaluated by them, here for med clearance. Pt says she had not had plans for SI until last night when she had plans to overdose on pills.
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
## 2018-04-04 NOTE — ED Provider Notes (Signed)
Please see previous ED notes as well as psychiatry assessment.  Plan was for inpatient psychiatric hospitalization.  Patient however did not meet an involuntary commitment criteria.  Plan was to transfer the patient to [facility] for inpatient psychiatric treatment.  Patient was offered this option but she does not want to be admitted there.  The situation was explained to the patient there were no other beds available at this time.  Patient this point contracts for safety.  She is to be discharged with outpatient follow-up.   Linwood DibblesKnapp, Shanecia Hoganson, MD 04/04/18 [phone]

## 2018-04-06 DIAGNOSIS — F330 Major depressive disorder, recurrent, mild: Secondary | ICD-10-CM | POA: Diagnosis not present

## 2018-04-06 DIAGNOSIS — H119 Unspecified disorder of conjunctiva: Secondary | ICD-10-CM | POA: Diagnosis not present

## 2018-04-06 DIAGNOSIS — M25511 Pain in right shoulder: Secondary | ICD-10-CM | POA: Diagnosis not present

## 2018-04-06 DIAGNOSIS — Z6832 Body mass index (BMI) 32.0-32.9, adult: Secondary | ICD-10-CM | POA: Diagnosis not present

## 2018-04-06 DIAGNOSIS — M25551 Pain in right hip: Secondary | ICD-10-CM | POA: Diagnosis not present

## 2018-04-06 DIAGNOSIS — F411 Generalized anxiety disorder: Secondary | ICD-10-CM | POA: Diagnosis not present

## 2018-04-13 DIAGNOSIS — M25551 Pain in right hip: Secondary | ICD-10-CM | POA: Diagnosis not present

## 2018-04-13 DIAGNOSIS — M6281 Muscle weakness (generalized): Secondary | ICD-10-CM | POA: Diagnosis not present

## 2018-04-13 DIAGNOSIS — M545 Low back pain: Secondary | ICD-10-CM | POA: Diagnosis not present

## 2018-04-16 DIAGNOSIS — M25551 Pain in right hip: Secondary | ICD-10-CM | POA: Diagnosis not present

## 2018-04-16 DIAGNOSIS — M6281 Muscle weakness (generalized): Secondary | ICD-10-CM | POA: Diagnosis not present

## 2018-04-16 DIAGNOSIS — M545 Low back pain: Secondary | ICD-10-CM | POA: Diagnosis not present

## 2018-04-18 DIAGNOSIS — E1140 Type 2 diabetes mellitus with diabetic neuropathy, unspecified: Secondary | ICD-10-CM | POA: Diagnosis not present

## 2018-04-18 DIAGNOSIS — G43719 Chronic migraine without aura, intractable, without status migrainosus: Secondary | ICD-10-CM | POA: Diagnosis not present

## 2018-04-18 DIAGNOSIS — G40804 Other epilepsy, intractable, without status epilepticus: Secondary | ICD-10-CM | POA: Diagnosis not present

## 2018-05-14 DIAGNOSIS — I10 Essential (primary) hypertension: Secondary | ICD-10-CM | POA: Diagnosis not present

## 2018-05-14 DIAGNOSIS — E1165 Type 2 diabetes mellitus with hyperglycemia: Secondary | ICD-10-CM | POA: Diagnosis not present

## 2018-05-14 DIAGNOSIS — M797 Fibromyalgia: Secondary | ICD-10-CM | POA: Diagnosis not present

## 2018-05-14 DIAGNOSIS — M545 Low back pain: Secondary | ICD-10-CM | POA: Diagnosis not present

## 2018-05-14 DIAGNOSIS — K219 Gastro-esophageal reflux disease without esophagitis: Secondary | ICD-10-CM | POA: Diagnosis not present

## 2018-05-17 DIAGNOSIS — Z6832 Body mass index (BMI) 32.0-32.9, adult: Secondary | ICD-10-CM | POA: Diagnosis not present

## 2018-05-17 DIAGNOSIS — I10 Essential (primary) hypertension: Secondary | ICD-10-CM | POA: Diagnosis not present

## 2018-05-17 DIAGNOSIS — Z0001 Encounter for general adult medical examination with abnormal findings: Secondary | ICD-10-CM | POA: Diagnosis not present

## 2018-06-05 ENCOUNTER — Encounter: Payer: Self-pay | Admitting: *Deleted

## 2018-06-06 ENCOUNTER — Encounter: Payer: Self-pay | Admitting: Diagnostic Neuroimaging

## 2018-06-06 ENCOUNTER — Encounter

## 2018-06-06 ENCOUNTER — Ambulatory Visit: Payer: Medicare HMO | Admitting: Diagnostic Neuroimaging

## 2018-06-08 DIAGNOSIS — E109 Type 1 diabetes mellitus without complications: Secondary | ICD-10-CM | POA: Diagnosis not present

## 2018-06-08 DIAGNOSIS — H25813 Combined forms of age-related cataract, bilateral: Secondary | ICD-10-CM | POA: Diagnosis not present

## 2018-06-08 DIAGNOSIS — Z0100 Encounter for examination of eyes and vision without abnormal findings: Secondary | ICD-10-CM | POA: Diagnosis not present

## 2018-06-08 DIAGNOSIS — H5200 Hypermetropia, unspecified eye: Secondary | ICD-10-CM | POA: Diagnosis not present

## 2018-06-11 DIAGNOSIS — G40804 Other epilepsy, intractable, without status epilepticus: Secondary | ICD-10-CM | POA: Diagnosis not present

## 2018-06-11 DIAGNOSIS — G4733 Obstructive sleep apnea (adult) (pediatric): Secondary | ICD-10-CM | POA: Diagnosis not present

## 2018-06-11 DIAGNOSIS — E1140 Type 2 diabetes mellitus with diabetic neuropathy, unspecified: Secondary | ICD-10-CM | POA: Diagnosis not present

## 2018-06-11 DIAGNOSIS — G43719 Chronic migraine without aura, intractable, without status migrainosus: Secondary | ICD-10-CM | POA: Diagnosis not present

## 2018-07-10 DIAGNOSIS — G4730 Sleep apnea, unspecified: Secondary | ICD-10-CM | POA: Diagnosis not present

## 2018-07-10 DIAGNOSIS — N201 Calculus of ureter: Secondary | ICD-10-CM | POA: Diagnosis not present

## 2018-07-10 DIAGNOSIS — N202 Calculus of kidney with calculus of ureter: Secondary | ICD-10-CM | POA: Diagnosis not present

## 2018-07-10 DIAGNOSIS — E119 Type 2 diabetes mellitus without complications: Secondary | ICD-10-CM | POA: Diagnosis not present

## 2018-07-10 DIAGNOSIS — R112 Nausea with vomiting, unspecified: Secondary | ICD-10-CM | POA: Diagnosis not present

## 2018-07-10 DIAGNOSIS — M797 Fibromyalgia: Secondary | ICD-10-CM | POA: Diagnosis not present

## 2018-07-10 DIAGNOSIS — R109 Unspecified abdominal pain: Secondary | ICD-10-CM | POA: Diagnosis not present

## 2018-07-10 DIAGNOSIS — K219 Gastro-esophageal reflux disease without esophagitis: Secondary | ICD-10-CM | POA: Diagnosis not present

## 2018-07-10 DIAGNOSIS — Z79899 Other long term (current) drug therapy: Secondary | ICD-10-CM | POA: Diagnosis not present

## 2018-07-10 DIAGNOSIS — Z87442 Personal history of urinary calculi: Secondary | ICD-10-CM | POA: Diagnosis not present

## 2018-07-10 DIAGNOSIS — I10 Essential (primary) hypertension: Secondary | ICD-10-CM | POA: Diagnosis not present

## 2018-07-11 DIAGNOSIS — K5792 Diverticulitis of intestine, part unspecified, without perforation or abscess without bleeding: Secondary | ICD-10-CM | POA: Diagnosis not present

## 2018-07-11 DIAGNOSIS — G40909 Epilepsy, unspecified, not intractable, without status epilepticus: Secondary | ICD-10-CM | POA: Diagnosis not present

## 2018-07-11 DIAGNOSIS — E876 Hypokalemia: Secondary | ICD-10-CM | POA: Diagnosis not present

## 2018-07-11 DIAGNOSIS — N132 Hydronephrosis with renal and ureteral calculous obstruction: Secondary | ICD-10-CM | POA: Diagnosis not present

## 2018-07-11 DIAGNOSIS — K5732 Diverticulitis of large intestine without perforation or abscess without bleeding: Secondary | ICD-10-CM | POA: Diagnosis not present

## 2018-07-11 DIAGNOSIS — E119 Type 2 diabetes mellitus without complications: Secondary | ICD-10-CM | POA: Diagnosis not present

## 2018-07-11 DIAGNOSIS — Z79899 Other long term (current) drug therapy: Secondary | ICD-10-CM | POA: Diagnosis not present

## 2018-07-11 DIAGNOSIS — K429 Umbilical hernia without obstruction or gangrene: Secondary | ICD-10-CM | POA: Diagnosis not present

## 2018-07-11 DIAGNOSIS — N23 Unspecified renal colic: Secondary | ICD-10-CM | POA: Diagnosis not present

## 2018-07-11 DIAGNOSIS — R112 Nausea with vomiting, unspecified: Secondary | ICD-10-CM | POA: Diagnosis not present

## 2018-07-11 DIAGNOSIS — I10 Essential (primary) hypertension: Secondary | ICD-10-CM | POA: Diagnosis not present

## 2018-07-11 DIAGNOSIS — N134 Hydroureter: Secondary | ICD-10-CM | POA: Diagnosis not present

## 2018-07-12 DIAGNOSIS — N23 Unspecified renal colic: Secondary | ICD-10-CM | POA: Diagnosis not present

## 2018-07-12 DIAGNOSIS — K5792 Diverticulitis of intestine, part unspecified, without perforation or abscess without bleeding: Secondary | ICD-10-CM | POA: Diagnosis not present

## 2018-07-13 DIAGNOSIS — N23 Unspecified renal colic: Secondary | ICD-10-CM | POA: Diagnosis not present

## 2018-07-13 DIAGNOSIS — K5792 Diverticulitis of intestine, part unspecified, without perforation or abscess without bleeding: Secondary | ICD-10-CM | POA: Diagnosis not present

## 2018-07-14 DIAGNOSIS — K5792 Diverticulitis of intestine, part unspecified, without perforation or abscess without bleeding: Secondary | ICD-10-CM | POA: Diagnosis not present

## 2018-07-14 DIAGNOSIS — N23 Unspecified renal colic: Secondary | ICD-10-CM | POA: Diagnosis not present

## 2018-07-24 DIAGNOSIS — J0101 Acute recurrent maxillary sinusitis: Secondary | ICD-10-CM | POA: Diagnosis not present

## 2018-08-03 DIAGNOSIS — Z23 Encounter for immunization: Secondary | ICD-10-CM | POA: Diagnosis not present

## 2018-08-03 DIAGNOSIS — I10 Essential (primary) hypertension: Secondary | ICD-10-CM | POA: Diagnosis not present

## 2018-08-03 DIAGNOSIS — Z6831 Body mass index (BMI) 31.0-31.9, adult: Secondary | ICD-10-CM | POA: Diagnosis not present

## 2018-08-03 DIAGNOSIS — G43009 Migraine without aura, not intractable, without status migrainosus: Secondary | ICD-10-CM | POA: Diagnosis not present

## 2018-08-03 DIAGNOSIS — K439 Ventral hernia without obstruction or gangrene: Secondary | ICD-10-CM | POA: Diagnosis not present

## 2018-08-03 DIAGNOSIS — G40319 Generalized idiopathic epilepsy and epileptic syndromes, intractable, without status epilepticus: Secondary | ICD-10-CM | POA: Diagnosis not present

## 2018-08-03 DIAGNOSIS — E1165 Type 2 diabetes mellitus with hyperglycemia: Secondary | ICD-10-CM | POA: Diagnosis not present

## 2018-08-03 DIAGNOSIS — M797 Fibromyalgia: Secondary | ICD-10-CM | POA: Diagnosis not present

## 2018-08-13 DIAGNOSIS — G5602 Carpal tunnel syndrome, left upper limb: Secondary | ICD-10-CM | POA: Diagnosis not present

## 2018-08-21 DIAGNOSIS — E119 Type 2 diabetes mellitus without complications: Secondary | ICD-10-CM | POA: Diagnosis not present

## 2018-08-21 DIAGNOSIS — G5602 Carpal tunnel syndrome, left upper limb: Secondary | ICD-10-CM | POA: Diagnosis not present

## 2018-08-21 DIAGNOSIS — F419 Anxiety disorder, unspecified: Secondary | ICD-10-CM | POA: Diagnosis not present

## 2018-08-21 DIAGNOSIS — M797 Fibromyalgia: Secondary | ICD-10-CM | POA: Diagnosis not present

## 2018-08-21 DIAGNOSIS — G4733 Obstructive sleep apnea (adult) (pediatric): Secondary | ICD-10-CM | POA: Diagnosis not present

## 2018-08-21 DIAGNOSIS — J45909 Unspecified asthma, uncomplicated: Secondary | ICD-10-CM | POA: Diagnosis not present

## 2018-08-21 DIAGNOSIS — G47419 Narcolepsy without cataplexy: Secondary | ICD-10-CM | POA: Diagnosis not present

## 2018-08-21 DIAGNOSIS — I10 Essential (primary) hypertension: Secondary | ICD-10-CM | POA: Diagnosis not present

## 2018-08-21 DIAGNOSIS — F329 Major depressive disorder, single episode, unspecified: Secondary | ICD-10-CM | POA: Diagnosis not present

## 2018-08-31 DIAGNOSIS — Z885 Allergy status to narcotic agent status: Secondary | ICD-10-CM | POA: Diagnosis not present

## 2018-08-31 DIAGNOSIS — Z7984 Long term (current) use of oral hypoglycemic drugs: Secondary | ICD-10-CM | POA: Diagnosis not present

## 2018-08-31 DIAGNOSIS — Z880 Allergy status to penicillin: Secondary | ICD-10-CM | POA: Diagnosis not present

## 2018-08-31 DIAGNOSIS — R569 Unspecified convulsions: Secondary | ICD-10-CM | POA: Diagnosis not present

## 2018-08-31 DIAGNOSIS — Z79899 Other long term (current) drug therapy: Secondary | ICD-10-CM | POA: Diagnosis not present

## 2018-08-31 DIAGNOSIS — G4089 Other seizures: Secondary | ICD-10-CM | POA: Diagnosis not present

## 2018-09-28 DIAGNOSIS — K219 Gastro-esophageal reflux disease without esophagitis: Secondary | ICD-10-CM | POA: Diagnosis not present

## 2018-09-28 DIAGNOSIS — M797 Fibromyalgia: Secondary | ICD-10-CM | POA: Diagnosis not present

## 2018-09-28 DIAGNOSIS — E1165 Type 2 diabetes mellitus with hyperglycemia: Secondary | ICD-10-CM | POA: Diagnosis not present

## 2018-09-28 DIAGNOSIS — I10 Essential (primary) hypertension: Secondary | ICD-10-CM | POA: Diagnosis not present

## 2018-10-03 DIAGNOSIS — K219 Gastro-esophageal reflux disease without esophagitis: Secondary | ICD-10-CM | POA: Diagnosis not present

## 2018-10-03 DIAGNOSIS — M545 Low back pain: Secondary | ICD-10-CM | POA: Diagnosis not present

## 2018-10-03 DIAGNOSIS — G43009 Migraine without aura, not intractable, without status migrainosus: Secondary | ICD-10-CM | POA: Diagnosis not present

## 2018-10-03 DIAGNOSIS — M797 Fibromyalgia: Secondary | ICD-10-CM | POA: Diagnosis not present

## 2018-10-03 DIAGNOSIS — Z23 Encounter for immunization: Secondary | ICD-10-CM | POA: Diagnosis not present

## 2018-10-09 DIAGNOSIS — E1140 Type 2 diabetes mellitus with diabetic neuropathy, unspecified: Secondary | ICD-10-CM | POA: Diagnosis not present

## 2018-10-09 DIAGNOSIS — G4733 Obstructive sleep apnea (adult) (pediatric): Secondary | ICD-10-CM | POA: Diagnosis not present

## 2018-10-09 DIAGNOSIS — G43719 Chronic migraine without aura, intractable, without status migrainosus: Secondary | ICD-10-CM | POA: Diagnosis not present

## 2018-11-09 DIAGNOSIS — M545 Low back pain: Secondary | ICD-10-CM | POA: Diagnosis not present

## 2018-11-09 DIAGNOSIS — M5416 Radiculopathy, lumbar region: Secondary | ICD-10-CM | POA: Diagnosis not present

## 2018-12-04 DIAGNOSIS — E1140 Type 2 diabetes mellitus with diabetic neuropathy, unspecified: Secondary | ICD-10-CM | POA: Diagnosis not present

## 2018-12-04 DIAGNOSIS — G43719 Chronic migraine without aura, intractable, without status migrainosus: Secondary | ICD-10-CM | POA: Diagnosis not present

## 2018-12-04 DIAGNOSIS — G894 Chronic pain syndrome: Secondary | ICD-10-CM | POA: Diagnosis not present

## 2018-12-04 DIAGNOSIS — G47419 Narcolepsy without cataplexy: Secondary | ICD-10-CM | POA: Diagnosis not present

## 2018-12-04 DIAGNOSIS — Z79891 Long term (current) use of opiate analgesic: Secondary | ICD-10-CM | POA: Diagnosis not present

## 2018-12-17 DIAGNOSIS — I959 Hypotension, unspecified: Secondary | ICD-10-CM | POA: Diagnosis not present

## 2018-12-17 DIAGNOSIS — E1165 Type 2 diabetes mellitus with hyperglycemia: Secondary | ICD-10-CM | POA: Diagnosis not present

## 2018-12-17 DIAGNOSIS — R42 Dizziness and giddiness: Secondary | ICD-10-CM | POA: Diagnosis not present

## 2018-12-21 DIAGNOSIS — H00029 Hordeolum internum unspecified eye, unspecified eyelid: Secondary | ICD-10-CM | POA: Diagnosis not present

## 2018-12-22 DIAGNOSIS — R5381 Other malaise: Secondary | ICD-10-CM | POA: Diagnosis not present

## 2018-12-22 DIAGNOSIS — T1490XA Injury, unspecified, initial encounter: Secondary | ICD-10-CM | POA: Diagnosis not present

## 2018-12-31 DIAGNOSIS — E1165 Type 2 diabetes mellitus with hyperglycemia: Secondary | ICD-10-CM | POA: Diagnosis not present

## 2018-12-31 DIAGNOSIS — K219 Gastro-esophageal reflux disease without esophagitis: Secondary | ICD-10-CM | POA: Diagnosis not present

## 2018-12-31 DIAGNOSIS — I10 Essential (primary) hypertension: Secondary | ICD-10-CM | POA: Diagnosis not present

## 2018-12-31 DIAGNOSIS — M797 Fibromyalgia: Secondary | ICD-10-CM | POA: Diagnosis not present

## 2019-01-01 DIAGNOSIS — M797 Fibromyalgia: Secondary | ICD-10-CM | POA: Diagnosis not present

## 2019-01-01 DIAGNOSIS — R000 Tachycardia, unspecified: Secondary | ICD-10-CM | POA: Diagnosis not present

## 2019-01-01 DIAGNOSIS — G40319 Generalized idiopathic epilepsy and epileptic syndromes, intractable, without status epilepticus: Secondary | ICD-10-CM | POA: Diagnosis not present

## 2019-01-01 DIAGNOSIS — I10 Essential (primary) hypertension: Secondary | ICD-10-CM | POA: Diagnosis not present

## 2019-01-01 DIAGNOSIS — G43009 Migraine without aura, not intractable, without status migrainosus: Secondary | ICD-10-CM | POA: Diagnosis not present

## 2019-01-01 DIAGNOSIS — Z6830 Body mass index (BMI) 30.0-30.9, adult: Secondary | ICD-10-CM | POA: Diagnosis not present

## 2019-01-01 DIAGNOSIS — G4733 Obstructive sleep apnea (adult) (pediatric): Secondary | ICD-10-CM | POA: Diagnosis not present

## 2019-01-01 DIAGNOSIS — E1165 Type 2 diabetes mellitus with hyperglycemia: Secondary | ICD-10-CM | POA: Diagnosis not present

## 2019-01-02 ENCOUNTER — Other Ambulatory Visit: Payer: Self-pay

## 2019-01-02 NOTE — Patient Outreach (Signed)
[facility]) Care Management  01/02/2019  TATUMN SWIECICKI 03-31-1956 [id number]   Medication Adherence call to Mrs. Wetzel Bjornstad HIPPA Compliant Voice message left with a call back number. Mrs. Licata is showing past due on Metformin under [facility] Ins.   Lillia Abed CPhT Pharmacy Technician [facility] Care Management Direct Dial [phone]  Fax [phone] [email or web address]

## 2019-01-17 ENCOUNTER — Telehealth: Payer: Self-pay | Admitting: *Deleted

## 2019-01-17 NOTE — Telephone Encounter (Signed)
Patient contacted.  History and symptoms reviewed.  Patient will follow up with Dr. Purvis Sheffield as scheduled, but will change to VV (video).    The patient verbally consented for a telehealth (video) visit with [facility] and understands that his/her insurance company will be billed for the encounter.

## 2019-01-21 ENCOUNTER — Telehealth (INDEPENDENT_AMBULATORY_CARE_PROVIDER_SITE_OTHER): Payer: Medicare HMO | Admitting: Cardiovascular Disease

## 2019-01-21 ENCOUNTER — Encounter: Payer: Self-pay | Admitting: Cardiovascular Disease

## 2019-01-21 VITALS — BP 112/70 | HR 77 | Ht 66.0 in | Wt 174.0 lb

## 2019-01-21 DIAGNOSIS — G4733 Obstructive sleep apnea (adult) (pediatric): Secondary | ICD-10-CM

## 2019-01-21 DIAGNOSIS — R42 Dizziness and giddiness: Secondary | ICD-10-CM | POA: Diagnosis not present

## 2019-01-21 DIAGNOSIS — R002 Palpitations: Secondary | ICD-10-CM

## 2019-01-21 NOTE — Patient Instructions (Signed)
Medication Instructions:  Continue all current medications.  Labwork: none  Testing/Procedures: none  Follow-Up: As needed.    Any Other Special Instructions Will Be Listed Below (If Applicable).  If you need a refill on your cardiac medications before your next appointment, please call your pharmacy.  

## 2019-01-21 NOTE — Progress Notes (Signed)
Virtual Visit via Phone Note (multiple attempts were made to connect via video but patient had several technical issues)   This visit type was conducted due to national recommendations for restrictions regarding the COVID-19 Pandemic (e.g. social distancing) in an effort to limit this patient's exposure and mitigate transmission in our community.  Due to her co-morbid illnesses, this patient is at least at moderate risk for complications without adequate follow up.  This format is felt to be most appropriate for this patient at this time.  All issues noted in this document were discussed and addressed.  A limited physical exam was performed with this format.  Please refer to the patient's chart for her consent to telehealth for [facility].   Evaluation Performed:  New Patient visit  Date:  01/21/2019   ID:  Cindy Norris, DOB 28-Jun-1956, MRN [id number]  Patient Location: Home Provider Location: Home  PCP:  [facility]  Cardiologist:  Prentice DockerSuresh Jahnai Slingerland, MD  Electrophysiologist:  None   Chief Complaint: Palpitations  History of Present Illness:    Cindy Norris is a 63 y.o. female with palpitations.  I reviewed notes from her PCP.  It appears she had an office visit on 12/17/2018 and complained of dizziness and problems with balance.  Blood pressure was 96/58 while sitting.  They had a problem with EKG machine malfunction and could not obtain one.  There was some thought of this being orthostatic in etiology and she was asked to drink at least 2-3 bottles of water a day and eat 2 large meals daily and to change positions slowly.  I reviewed labs dated 12/17/18: BUN 17, creatinine 0.75, sodium 145, potassium 4.5, normal liver transaminases, TSH 3.14, white blood cell 6.6, hemoglobin 11.9, platelets 261.  PMH includes hyperlipidemia, sleep apnea, and type 2 diabetes.  Symptoms have improved. No longer having palpitations at all. She denies chest pain. Has had chronic  exertional dyspnea. She does feel fatigued. This "has been going on for a while", she thinks maybe 2 months.  Says she has sleep apnea but no longer uses CPAP because machine stopped working.  CXR 04/04/18 shoed no active cardiopulmonary disease.  The patient does not have symptoms concerning for COVID-19 infection (fever, chills, cough, or new shortness of breath).    Past Medical History:  Diagnosis Date  . Chronic pain syndrome   . Depression   . Diabetes mellitus   . GERD (gastroesophageal reflux disease)   . Hyperlipidemia, mixed   . Migraines   . Narcolepsy   . Osteoarthritis   . Overweight(278.02)    Obesity  . Seizures (HCC)   . Sleep apnea   . Unspecified essential hypertension    Past Surgical History:  Procedure Laterality Date  . KNEE ARTHROSCOPY    . MASTECTOMY     Right  . PILONIDAL CYST EXCISION    . TOTAL KNEE ARTHROPLASTY       No outpatient medications have been marked as taking for the 01/21/19 encounter (Telemedicine) with Laqueta LindenKoneswaran, Montanna Mcbain A, MD.     Allergies:   Azithromycin; Other; Tape; Erythromycin; Hydromorphone hcl; Lyrica [pregabalin]; Penicillins; Reglan [metoclopramide]; and Sulfonamide derivatives   Social History   Tobacco Use  . Smoking status: Former Smoker    Last attempt to quit: 09/26/1988    Years since quitting: 30.3  . Smokeless tobacco: Never Used  Substance Use Topics  . Alcohol use: No  . Drug use: Never     Family Hx: The patient's  family history includes Coronary artery disease in an other family member.  ROS:   Please see the history of present illness.     All other systems reviewed and are negative.   Prior CV studies:   The following studies were reviewed today:  NA  Labs/Other Tests and Data Reviewed:    EKG:  An ECG dated 04/04/18 was personally reviewed today and demonstrated:  NSR with nonspecific T wave abnormalities  Recent Labs: 04/04/2018: ALT 29; BUN 14; Creatinine, Ser 0.63; Hemoglobin 12.7;  Platelets 288; Potassium 3.3; Sodium 144   Recent Lipid Panel No results found for: CHOL, TRIG, HDL, CHOLHDL, LDLCALC, LDLDIRECT  Wt Readings from Last 3 Encounters:  01/21/19 174 lb (78.9 kg)  04/04/18 191 lb (86.6 kg)  02/09/14 191 lb (86.6 kg)     Objective:    Vital Signs:  BP 112/70   Pulse 77   Ht 5\' 6"  (1.676 m)   Wt 174 lb (78.9 kg)   BMI 28.08 kg/m    VITAL SIGNS:  reviewed  ASSESSMENT & PLAN:    1. Palpitations and dizziness: Symptoms have completely subsided. I informed her to contact me should symptoms recur in which case I would have her come to the office for a visit and cardiac testing could potentially be pursued.  2. OSA: Says CPAP machine is broken and hasn't been using it. She has felt fatigued since CPAP cessation.    COVID-19 Education: The signs and symptoms of COVID-19 were discussed with the patient and how to seek care for testing (follow up with PCP or arrange E-visit).  The importance of social distancing was discussed today.  Time:   Today, I have spent 20 minutes with the patient with telehealth technology discussing the above problems.     Medication Adjustments/Labs and Tests Ordered: Current medicines are reviewed at length with the patient today.  Concerns regarding medicines are outlined above.   Tests Ordered: No orders of the defined types were placed in this encounter.   Medication Changes: No orders of the defined types were placed in this encounter.   Disposition:  Follow up prn  Signed, Prentice Docker, MD  01/21/2019 11:49 AM    [facility]

## 2019-01-29 DIAGNOSIS — G894 Chronic pain syndrome: Secondary | ICD-10-CM | POA: Diagnosis not present

## 2019-01-29 DIAGNOSIS — G43719 Chronic migraine without aura, intractable, without status migrainosus: Secondary | ICD-10-CM | POA: Diagnosis not present

## 2019-01-29 DIAGNOSIS — G47419 Narcolepsy without cataplexy: Secondary | ICD-10-CM | POA: Diagnosis not present

## 2019-01-29 DIAGNOSIS — E1140 Type 2 diabetes mellitus with diabetic neuropathy, unspecified: Secondary | ICD-10-CM | POA: Diagnosis not present

## 2019-01-29 DIAGNOSIS — Z79891 Long term (current) use of opiate analgesic: Secondary | ICD-10-CM | POA: Diagnosis not present

## 2019-01-29 DIAGNOSIS — G40804 Other epilepsy, intractable, without status epilepticus: Secondary | ICD-10-CM | POA: Diagnosis not present

## 2019-03-04 DIAGNOSIS — H6012 Cellulitis of left external ear: Secondary | ICD-10-CM | POA: Diagnosis not present

## 2019-04-01 DIAGNOSIS — E1165 Type 2 diabetes mellitus with hyperglycemia: Secondary | ICD-10-CM | POA: Diagnosis not present

## 2019-04-01 DIAGNOSIS — F330 Major depressive disorder, recurrent, mild: Secondary | ICD-10-CM | POA: Diagnosis not present

## 2019-04-01 DIAGNOSIS — R000 Tachycardia, unspecified: Secondary | ICD-10-CM | POA: Diagnosis not present

## 2019-04-01 DIAGNOSIS — I959 Hypotension, unspecified: Secondary | ICD-10-CM | POA: Diagnosis not present

## 2019-04-01 DIAGNOSIS — I10 Essential (primary) hypertension: Secondary | ICD-10-CM | POA: Diagnosis not present

## 2019-04-01 DIAGNOSIS — G40319 Generalized idiopathic epilepsy and epileptic syndromes, intractable, without status epilepticus: Secondary | ICD-10-CM | POA: Diagnosis not present

## 2019-04-01 DIAGNOSIS — K439 Ventral hernia without obstruction or gangrene: Secondary | ICD-10-CM | POA: Diagnosis not present

## 2019-04-01 DIAGNOSIS — G4733 Obstructive sleep apnea (adult) (pediatric): Secondary | ICD-10-CM | POA: Diagnosis not present

## 2019-04-04 DIAGNOSIS — R000 Tachycardia, unspecified: Secondary | ICD-10-CM | POA: Diagnosis not present

## 2019-04-04 DIAGNOSIS — Z1389 Encounter for screening for other disorder: Secondary | ICD-10-CM | POA: Diagnosis not present

## 2019-04-04 DIAGNOSIS — K219 Gastro-esophageal reflux disease without esophagitis: Secondary | ICD-10-CM | POA: Diagnosis not present

## 2019-04-04 DIAGNOSIS — G43009 Migraine without aura, not intractable, without status migrainosus: Secondary | ICD-10-CM | POA: Diagnosis not present

## 2019-04-04 DIAGNOSIS — F330 Major depressive disorder, recurrent, mild: Secondary | ICD-10-CM | POA: Diagnosis not present

## 2019-04-04 DIAGNOSIS — I10 Essential (primary) hypertension: Secondary | ICD-10-CM | POA: Diagnosis not present

## 2019-04-04 DIAGNOSIS — G40319 Generalized idiopathic epilepsy and epileptic syndromes, intractable, without status epilepticus: Secondary | ICD-10-CM | POA: Diagnosis not present

## 2019-04-17 DIAGNOSIS — G43719 Chronic migraine without aura, intractable, without status migrainosus: Secondary | ICD-10-CM | POA: Diagnosis not present

## 2019-04-17 DIAGNOSIS — E1140 Type 2 diabetes mellitus with diabetic neuropathy, unspecified: Secondary | ICD-10-CM | POA: Diagnosis not present

## 2019-04-17 DIAGNOSIS — G47419 Narcolepsy without cataplexy: Secondary | ICD-10-CM | POA: Diagnosis not present

## 2019-04-17 DIAGNOSIS — G40804 Other epilepsy, intractable, without status epilepticus: Secondary | ICD-10-CM | POA: Diagnosis not present

## 2019-05-13 DIAGNOSIS — Z1231 Encounter for screening mammogram for malignant neoplasm of breast: Secondary | ICD-10-CM | POA: Diagnosis not present

## 2019-05-20 DIAGNOSIS — G5602 Carpal tunnel syndrome, left upper limb: Secondary | ICD-10-CM | POA: Diagnosis not present

## 2019-05-30 DIAGNOSIS — H109 Unspecified conjunctivitis: Secondary | ICD-10-CM | POA: Diagnosis not present

## 2019-06-18 DIAGNOSIS — G47419 Narcolepsy without cataplexy: Secondary | ICD-10-CM | POA: Diagnosis not present

## 2019-06-18 DIAGNOSIS — E1140 Type 2 diabetes mellitus with diabetic neuropathy, unspecified: Secondary | ICD-10-CM | POA: Diagnosis not present

## 2019-06-18 DIAGNOSIS — G43719 Chronic migraine without aura, intractable, without status migrainosus: Secondary | ICD-10-CM | POA: Diagnosis not present

## 2019-08-02 DIAGNOSIS — K219 Gastro-esophageal reflux disease without esophagitis: Secondary | ICD-10-CM | POA: Diagnosis not present

## 2019-08-02 DIAGNOSIS — I959 Hypotension, unspecified: Secondary | ICD-10-CM | POA: Diagnosis not present

## 2019-08-02 DIAGNOSIS — E1165 Type 2 diabetes mellitus with hyperglycemia: Secondary | ICD-10-CM | POA: Diagnosis not present

## 2019-08-02 DIAGNOSIS — I10 Essential (primary) hypertension: Secondary | ICD-10-CM | POA: Diagnosis not present

## 2019-08-02 DIAGNOSIS — R42 Dizziness and giddiness: Secondary | ICD-10-CM | POA: Diagnosis not present

## 2019-08-02 DIAGNOSIS — M797 Fibromyalgia: Secondary | ICD-10-CM | POA: Diagnosis not present

## 2019-08-06 DIAGNOSIS — Z23 Encounter for immunization: Secondary | ICD-10-CM | POA: Diagnosis not present

## 2019-08-06 DIAGNOSIS — K219 Gastro-esophageal reflux disease without esophagitis: Secondary | ICD-10-CM | POA: Diagnosis not present

## 2019-08-06 DIAGNOSIS — G43009 Migraine without aura, not intractable, without status migrainosus: Secondary | ICD-10-CM | POA: Diagnosis not present

## 2019-08-06 DIAGNOSIS — E1165 Type 2 diabetes mellitus with hyperglycemia: Secondary | ICD-10-CM | POA: Diagnosis not present

## 2019-08-06 DIAGNOSIS — I10 Essential (primary) hypertension: Secondary | ICD-10-CM | POA: Diagnosis not present

## 2019-08-14 DIAGNOSIS — E1140 Type 2 diabetes mellitus with diabetic neuropathy, unspecified: Secondary | ICD-10-CM | POA: Diagnosis not present

## 2019-08-14 DIAGNOSIS — G47419 Narcolepsy without cataplexy: Secondary | ICD-10-CM | POA: Diagnosis not present

## 2019-08-14 DIAGNOSIS — G894 Chronic pain syndrome: Secondary | ICD-10-CM | POA: Diagnosis not present

## 2019-08-14 DIAGNOSIS — G43719 Chronic migraine without aura, intractable, without status migrainosus: Secondary | ICD-10-CM | POA: Diagnosis not present

## 2019-08-14 DIAGNOSIS — Z79891 Long term (current) use of opiate analgesic: Secondary | ICD-10-CM | POA: Diagnosis not present

## 2019-08-21 DIAGNOSIS — Z87891 Personal history of nicotine dependence: Secondary | ICD-10-CM | POA: Diagnosis not present

## 2019-08-21 DIAGNOSIS — E119 Type 2 diabetes mellitus without complications: Secondary | ICD-10-CM | POA: Diagnosis not present

## 2019-08-21 DIAGNOSIS — G4730 Sleep apnea, unspecified: Secondary | ICD-10-CM | POA: Diagnosis not present

## 2019-08-21 DIAGNOSIS — M1990 Unspecified osteoarthritis, unspecified site: Secondary | ICD-10-CM | POA: Diagnosis not present

## 2019-08-21 DIAGNOSIS — G8929 Other chronic pain: Secondary | ICD-10-CM | POA: Diagnosis not present

## 2019-08-21 DIAGNOSIS — M797 Fibromyalgia: Secondary | ICD-10-CM | POA: Diagnosis not present

## 2019-08-21 DIAGNOSIS — G47419 Narcolepsy without cataplexy: Secondary | ICD-10-CM | POA: Diagnosis not present

## 2019-08-21 DIAGNOSIS — K219 Gastro-esophageal reflux disease without esophagitis: Secondary | ICD-10-CM | POA: Diagnosis not present

## 2019-08-21 DIAGNOSIS — Z79899 Other long term (current) drug therapy: Secondary | ICD-10-CM | POA: Diagnosis not present

## 2019-08-21 DIAGNOSIS — I10 Essential (primary) hypertension: Secondary | ICD-10-CM | POA: Diagnosis not present

## 2019-08-21 DIAGNOSIS — Z7984 Long term (current) use of oral hypoglycemic drugs: Secondary | ICD-10-CM | POA: Diagnosis not present

## 2019-09-24 DIAGNOSIS — E1165 Type 2 diabetes mellitus with hyperglycemia: Secondary | ICD-10-CM | POA: Diagnosis not present

## 2019-09-24 DIAGNOSIS — G43009 Migraine without aura, not intractable, without status migrainosus: Secondary | ICD-10-CM | POA: Diagnosis not present

## 2019-09-24 DIAGNOSIS — I10 Essential (primary) hypertension: Secondary | ICD-10-CM | POA: Diagnosis not present

## 2019-09-25 DIAGNOSIS — Z0001 Encounter for general adult medical examination with abnormal findings: Secondary | ICD-10-CM | POA: Diagnosis not present

## 2019-09-25 DIAGNOSIS — E1165 Type 2 diabetes mellitus with hyperglycemia: Secondary | ICD-10-CM | POA: Diagnosis not present

## 2019-09-25 DIAGNOSIS — M797 Fibromyalgia: Secondary | ICD-10-CM | POA: Diagnosis not present

## 2019-09-25 DIAGNOSIS — I10 Essential (primary) hypertension: Secondary | ICD-10-CM | POA: Diagnosis not present

## 2019-10-14 DIAGNOSIS — E1140 Type 2 diabetes mellitus with diabetic neuropathy, unspecified: Secondary | ICD-10-CM | POA: Diagnosis not present

## 2019-10-14 DIAGNOSIS — G43719 Chronic migraine without aura, intractable, without status migrainosus: Secondary | ICD-10-CM | POA: Diagnosis not present

## 2019-10-14 DIAGNOSIS — G40804 Other epilepsy, intractable, without status epilepticus: Secondary | ICD-10-CM | POA: Diagnosis not present

## 2019-10-14 DIAGNOSIS — G47419 Narcolepsy without cataplexy: Secondary | ICD-10-CM | POA: Diagnosis not present

## 2019-12-23 ENCOUNTER — Other Ambulatory Visit: Payer: Self-pay

## 2020-01-29 ENCOUNTER — Emergency Department (HOSPITAL_COMMUNITY): Payer: Medicare Other

## 2020-01-29 ENCOUNTER — Other Ambulatory Visit (HOSPITAL_COMMUNITY): Payer: Self-pay | Admitting: Neurology

## 2020-01-29 ENCOUNTER — Other Ambulatory Visit: Payer: Self-pay

## 2020-01-29 ENCOUNTER — Encounter (HOSPITAL_COMMUNITY): Payer: Self-pay | Admitting: *Deleted

## 2020-01-29 ENCOUNTER — Emergency Department (HOSPITAL_COMMUNITY)
Admission: EM | Admit: 2020-01-29 | Discharge: 2020-01-29 | Disposition: A | Discharge status: Home / Self Care | Payer: Medicare Other | Attending: Emergency Medicine | Admitting: Emergency Medicine

## 2020-01-29 DIAGNOSIS — S199XXA Unspecified injury of neck, initial encounter: Secondary | ICD-10-CM | POA: Diagnosis not present

## 2020-01-29 DIAGNOSIS — S161XXD Strain of muscle, fascia and tendon at neck level, subsequent encounter: Secondary | ICD-10-CM | POA: Diagnosis not present

## 2020-01-29 DIAGNOSIS — G43909 Migraine, unspecified, not intractable, without status migrainosus: Secondary | ICD-10-CM | POA: Insufficient documentation

## 2020-01-29 DIAGNOSIS — S139XXD Sprain of joints and ligaments of unspecified parts of neck, subsequent encounter: Secondary | ICD-10-CM

## 2020-01-29 DIAGNOSIS — Z7984 Long term (current) use of oral hypoglycemic drugs: Secondary | ICD-10-CM | POA: Diagnosis not present

## 2020-01-29 DIAGNOSIS — Z79899 Other long term (current) drug therapy: Secondary | ICD-10-CM | POA: Insufficient documentation

## 2020-01-29 DIAGNOSIS — Z79891 Long term (current) use of opiate analgesic: Secondary | ICD-10-CM | POA: Diagnosis not present

## 2020-01-29 DIAGNOSIS — W01198D Fall on same level from slipping, tripping and stumbling with subsequent striking against other object, subsequent encounter: Secondary | ICD-10-CM | POA: Insufficient documentation

## 2020-01-29 DIAGNOSIS — W19XXXD Unspecified fall, subsequent encounter: Secondary | ICD-10-CM

## 2020-01-29 DIAGNOSIS — M542 Cervicalgia: Secondary | ICD-10-CM | POA: Diagnosis not present

## 2020-01-29 DIAGNOSIS — E119 Type 2 diabetes mellitus without complications: Secondary | ICD-10-CM | POA: Insufficient documentation

## 2020-01-29 DIAGNOSIS — M545 Low back pain: Secondary | ICD-10-CM | POA: Diagnosis not present

## 2020-01-29 DIAGNOSIS — G8929 Other chronic pain: Secondary | ICD-10-CM | POA: Diagnosis not present

## 2020-01-29 DIAGNOSIS — S0990XA Unspecified injury of head, initial encounter: Secondary | ICD-10-CM | POA: Diagnosis not present

## 2020-01-29 DIAGNOSIS — I951 Orthostatic hypotension: Secondary | ICD-10-CM | POA: Diagnosis not present

## 2020-01-29 DIAGNOSIS — Z96652 Presence of left artificial knee joint: Secondary | ICD-10-CM | POA: Diagnosis not present

## 2020-01-29 DIAGNOSIS — I10 Essential (primary) hypertension: Secondary | ICD-10-CM | POA: Insufficient documentation

## 2020-01-29 DIAGNOSIS — R296 Repeated falls: Secondary | ICD-10-CM | POA: Diagnosis not present

## 2020-01-29 DIAGNOSIS — M25562 Pain in left knee: Secondary | ICD-10-CM | POA: Diagnosis not present

## 2020-01-29 DIAGNOSIS — R52 Pain, unspecified: Secondary | ICD-10-CM

## 2020-01-29 DIAGNOSIS — G894 Chronic pain syndrome: Secondary | ICD-10-CM | POA: Diagnosis not present

## 2020-01-29 LAB — BASIC METABOLIC PANEL
Anion gap: 5 (ref 5–15)
BUN: 22 mg/dL (ref 8–23)
CO2: 26 mmol/L (ref 22–32)
Calcium: 9 mg/dL (ref 8.9–10.3)
Chloride: 108 mmol/L (ref 98–111)
Creatinine, Ser: 0.72 mg/dL (ref 0.44–1.00)
GFR calc Af Amer: >60 mL/min (ref >60)
GFR calc non Af Amer: >60 mL/min (ref >60)
Glucose, Bld: 120 mg/dL — ABNORMAL HIGH (ref 70–99)
Potassium: 3.7 mmol/L (ref 3.5–5.1)
Sodium: 139 mmol/L (ref 135–145)

## 2020-01-29 LAB — URINALYSIS, ROUTINE W REFLEX MICROSCOPIC
Bacteria, UA: NONE SEEN
Bilirubin Urine: NEGATIVE
Glucose, UA: NEGATIVE mg/dL
Ketones, ur: NEGATIVE mg/dL
Leukocytes,Ua: NEGATIVE
Nitrite: NEGATIVE
Protein, ur: NEGATIVE mg/dL
Specific Gravity, Urine: 1.011 (ref 1.005–1.030)
pH: 6 (ref 5.0–8.0)

## 2020-01-29 LAB — CBC WITH DIFFERENTIAL/PLATELET
Abs Immature Granulocytes: 0.02 10*3/uL (ref 0.00–0.07)
Basophils Absolute: 0 10*3/uL (ref 0.0–0.1)
Basophils Relative: 0 %
Eosinophils Absolute: 0.1 10*3/uL (ref 0.0–0.5)
Eosinophils Relative: 2 %
HCT: 33.2 % — ABNORMAL LOW (ref 36.0–46.0)
Hemoglobin: 10 g/dL — ABNORMAL LOW (ref 12.0–15.0)
Immature Granulocytes: 0 %
Lymphocytes Relative: 33 %
Lymphs Abs: 1.7 10*3/uL (ref 0.7–4.0)
MCH: 26 pg (ref 26.0–34.0)
MCHC: 30.1 g/dL (ref 30.0–36.0)
MCV: 86.5 fL (ref 80.0–100.0)
Monocytes Absolute: 0.5 10*3/uL (ref 0.1–1.0)
Monocytes Relative: 9 %
Neutro Abs: 2.8 10*3/uL (ref 1.7–7.7)
Neutrophils Relative %: 56 %
Platelets: 194 10*3/uL (ref 150–400)
RBC: 3.84 MIL/uL — ABNORMAL LOW (ref 3.87–5.11)
RDW: 13.3 % (ref 11.5–15.5)
WBC: 5.2 10*3/uL (ref 4.0–10.5)
nRBC: 0 % (ref 0.0–0.2)

## 2020-01-29 MED ORDER — SODIUM CHLORIDE 0.9 % IV BOLUS
500.0000 mL | Freq: Once | INTRAVENOUS | Status: AC
  Administered 2020-01-29: 500 mL via INTRAVENOUS

## 2020-01-29 NOTE — Discharge Instructions (Signed)
Follow-up with the orthopedist for your knee pain.  Your imaging and labs did not show any significant findings.  You should keep drinking plenty of fluids at home.  Follow-up with your primary care doctor

## 2020-01-29 NOTE — ED Notes (Signed)
PT is aware we need urine sample.  

## 2020-01-29 NOTE — ED Triage Notes (Signed)
Pt sent here for frequent falls, pt states she fell x 3 within a week the other week ago.  Pt states she sprained her left knee with fall.

## 2020-01-29 NOTE — ED Provider Notes (Signed)
[facility] EMERGENCY DEPARTMENT Provider Note   CSN: [id number] Arrival date & time: 01/29/20  1212     History Chief Complaint  Patient presents with  . Fall    Cindy Norris is a 64 y.o. female with past medical history significant for chronic pain, depression, diabetes, Migraines, Seizures who presents for evaluation of fre falls. Apparently had 3 falls over two week ago. No recurrent falls since.  First fall was when she was bending down to pick something up.  Second fall was when she rolled out of bed.  Third fall is when she tripped over a pallet wood.  Initially denies any head however now states she thinks she did hit her head.  She is on anticoagulation.  She denies loss of consciousness. No preceding thunderclap HA, dizziness, CP, SOB, unilateral weakness, slurred speech.  He was seen by [facility] at the time.  However patient states she only had x-ray of her left wrist.  They told her this was a sprain and placed her in a splint.  Patient states she now has right-sided neck pain as well as left knee pain.  Has history of prior left knee replacement.  Pain is worse when the "weather changes."  She is not taking anything for this.  Apparently was seen by her PCP after the fall and was told she had orthostatic hypotension.  Had systolic blood pressure in the 90s in the office.  They told her to drink more fluids, eat 2 solid meals and increase her salt intake.  She was seen in follow-up by cardiology less than 1 week ago and her lightheadedness and dizziness had resolved.  She walks with a walker at baseline.  She denies any recent falls since being evaluated by cardiology, PCP right [facility].  States she was told by her PCP since she did not have imaging of her head and her neck done at previous ED visit she needs to come to the emergency department for evaluation to have imaging performed.  Denies headache, lightheadedness, dizziness, fever, chills, nausea, vomiting, chest  pain, shortness of breath abdominal pain, diarrhea, dysuria, edema, erythema or warmth to extremities.  Admits to decreased range of motion to her bilateral knees which she states is "all the time."  Denies additional aggravating or relieving factors.  Does have a history of seizures however denies any seizure-like activity, tongue biting, bowel or bladder incontinence during her falls.  Was seen by Dr. Judithann Sheen today.  And encouraged to come here for imaging of her neck and knee given she is still having pain.  History obtained from patient and past medical records.  No interpreter is used.     HPI     Past Medical History:  Diagnosis Date  . Chronic pain syndrome   . Depression   . Diabetes mellitus   . GERD (gastroesophageal reflux disease)   . Hyperlipidemia, mixed   . Migraines   . Narcolepsy   . Osteoarthritis   . Overweight(278.02)    Obesity  . Seizures (HCC)   . Sleep apnea   . Unspecified essential hypertension     Patient Active Problem List   Diagnosis Date Noted  . HYPERLIPIDEMIA-MIXED 06/02/2009  . OVERWEIGHT/OBESITY 06/02/2009  . HYPERTENSION, UNSPECIFIED 06/02/2009    Past Surgical History:  Procedure Laterality Date  . KNEE ARTHROSCOPY    . MASTECTOMY     Right  . PILONIDAL CYST EXCISION    . TOTAL KNEE ARTHROPLASTY  OB History   No obstetric history on file.     Family History  Problem Relation Age of Onset  . Coronary artery disease Other     Social History   Tobacco Use  . Smoking status: Former Smoker    Quit date: 09/26/1988    Years since quitting: 31.3  . Smokeless tobacco: Never Used  Substance Use Topics  . Alcohol use: No  . Drug use: Never    Home Medications Prior to Admission medications   Medication Sig Start Date End Date Taking? Authorizing Provider  albuterol (VENTOLIN HFA) 108 (90 Base) MCG/ACT inhaler Inhale 1-2 puffs into the lungs as needed for wheezing or shortness of breath.    [provider]    atorvastatin (LIPITOR) 80 MG tablet Take 80 mg by mouth daily.      [provider]  busPIRone (BUSPAR) 10 MG tablet Take 10 mg by mouth 2 (two) times daily.    [provider]  diclofenac sodium (VOLTAREN) 1 % GEL Apply 2 g topically 4 (four) times daily as needed (pain).    [provider]  Dulaglutide 0.75 MG/0.5ML SOPN Inject into the skin. 0.75mg  every Wednesday.    [provider]  gabapentin (NEURONTIN) 800 MG tablet Take 800 mg by mouth 2 (two) times daily.     [provider]  HYDROcodone-acetaminophen (NORCO/VICODIN) 5-325 MG tablet Take 1 tablet by mouth daily as needed for moderate pain.    [provider]  hydroxypropyl methylcellulose / hypromellose (ISOPTO TEARS / GONIOVISC) 2.5 % ophthalmic solution Place 1 drop into both eyes 3 (three) times daily as needed for dry eyes.    [provider]  hydrOXYzine (ATARAX/VISTARIL) 25 MG tablet Take 25 mg by mouth 3 (three) times daily as needed for anxiety.    [provider]  ibuprofen (ADVIL,MOTRIN) 800 MG tablet Take 800 mg by mouth every 8 (eight) hours as needed for moderate pain.    [provider]  levETIRAcetam (KEPPRA) 1000 MG tablet Take 1,000 mg by mouth 2 (two) times daily.    [provider]  metFORMIN (GLUCOPHAGE-XR) 500 MG 24 hr tablet Take 500 mg by mouth daily.     [provider]  modafinil (PROVIGIL) 200 MG tablet Take 400 mg by mouth every morning.     [provider]  nortriptyline (PAMELOR) 25 MG capsule Take 25 mg by mouth at bedtime.    [provider]  omeprazole (PRILOSEC) 20 MG capsule Take 20 mg by mouth daily.     [provider]  oxybutynin (DITROPAN) 5 MG tablet Take 5 mg by mouth 3 (three) times daily.    [provider]  potassium chloride SA (K-DUR,KLOR-CON) 20 MEQ tablet Take 20 mEq by mouth 2 (two) times daily.    [provider]  SUMAtriptan (IMITREX) 50 MG tablet  Take 50-100 mg by mouth every 2 (two) hours as needed for migraine. May repeat in 2 hours if headache persists or recurs.    [provider]  topiramate (TOPAMAX) 100 MG tablet Take 100 mg by mouth 2 (two) times daily.    [provider]  venlafaxine XR (EFFEXOR-XR) 150 MG 24 hr capsule Take 150 mg by mouth 2 (two) times daily.    [provider]    Allergies    Azithromycin, Other, Tape, Erythromycin, Hydromorphone hcl, Lyrica [pregabalin], Penicillins, Reglan [metoclopramide], and Sulfonamide derivatives  Review of Systems   Review of Systems  Constitutional: Negative.  HENT: Negative.   Respiratory: Negative.   Cardiovascular: Negative.   Gastrointestinal: Negative.   Genitourinary: Negative.   Musculoskeletal: Positive for gait problem (Chronic, walks with a walker) and neck pain. Negative for arthralgias, back pain, joint swelling, myalgias and neck stiffness.       Left knee pain  Skin: Negative.   All other systems reviewed and are negative.   Physical Exam Updated Vital Signs BP 140/76   Pulse 80   Temp 97.6 F (36.4 C) (Oral)   Resp 17   Ht 5\' 6"  (1.676 m)   Wt 90.7 kg   SpO2 98%   BMI 32.28 kg/m   Physical Exam Vitals and nursing note reviewed.  Constitutional:      General: She is not in acute distress.    Appearance: She is well-developed. She is not ill-appearing, toxic-appearing or diaphoretic.  HENT:     Head: Normocephalic and atraumatic.     Nose: Nose normal.     Mouth/Throat:     Mouth: Mucous membranes are moist.     Pharynx: Oropharynx is clear.  Eyes:     Pupils: Pupils are equal, round, and reactive to light.  Cardiovascular:     Rate and Rhythm: Normal rate.     Pulses: Normal pulses.     Heart sounds: Normal heart sounds.  Pulmonary:     Effort: Pulmonary effort is normal. No respiratory distress.     Breath sounds: Normal breath sounds.  Abdominal:     General: Bowel sounds are normal. There is no  distension.     Palpations: Abdomen is soft.     Tenderness: There is no abdominal tenderness. There is no right CVA tenderness, left CVA tenderness or guarding.  Musculoskeletal:        General: No swelling, tenderness, deformity or signs of injury. Normal range of motion.     Cervical back: Normal range of motion.     Right lower leg: No edema.     Left lower leg: No edema.     Comments: No midline cervical, thoracic or lumbar tenderness.  No chest wall crepitus.  Pelvis stable, nontender palpation.  Able to flex and extend at bilateral upper and lower extremities however pain with extension to left knee.  Patient states chronic.  No bony tenderness to extremities.  Wiggles toes without difficulty.  No shortening or rotation of leg.  Skin:    General: Skin is warm and dry.     Capillary Refill: Capillary refill takes less than 2 seconds.     Comments: Old, well-healed surgical incision to knee.  No drainage or evidence of infectious process.  Neurological:     General: No focal deficit present.     Mental Status: She is alert and oriented to person, place, and time.     Comments: CN 2-12 grossly intact. Ambulates with walker, at baseline, without ataxia Negative finger-nose, heel-to-shin, Romberg    ED Results / Procedures / Treatments   Labs (all labs ordered are listed, but only abnormal results are displayed) Labs Reviewed  CBC WITH DIFFERENTIAL/PLATELET - Abnormal; Notable for the following components:      Result Value   RBC 3.84 (*)    Hemoglobin 10.0 (*)    HCT 33.2 (*)    All other components within normal limits  BASIC METABOLIC PANEL - Abnormal; Notable for the following components:   Glucose, Bld 120 (*)    All other components within normal limits  URINALYSIS, ROUTINE W REFLEX  MICROSCOPIC - Abnormal; Notable for the following components:   Color, Urine STRAW (*)    Hgb urine dipstick MODERATE (*)    All other components within normal limits    EKG EKG  Interpretation  Date/Time:  Wednesday [name] 29 2020 13:42:59 EDT Ventricular Rate:  77 PR Interval:    QRS Duration: 98 QT Interval:  351 QTC Calculation: 398 R Axis:   62 Text Interpretation: Sinus rhythm Borderline T abnormalities, anterior leads No significant change since last tracing Confirmed by Benjiman CorePickering, Nathan [phone]([id number]) on 01/29/2020 2:56:52 PM   Radiology CT Head Wo Contrast  Result Date: 01/29/2020 CLINICAL DATA:  Larey SeatFell 2 weeks ago and hit forehead on bedside table. Denies loss of consciousness. LEFT-sided head and neck pain. EXAM: CT HEAD WITHOUT CONTRAST CT CERVICAL SPINE WITHOUT CONTRAST TECHNIQUE: Multidetector CT imaging of the head and cervical spine was performed following the standard protocol without intravenous contrast. Multiplanar CT image reconstructions of the cervical spine were also generated. COMPARISON:  04/04/2018 FINDINGS: CT HEAD FINDINGS Brain: No evidence of acute infarction, hemorrhage, hydrocephalus, extra-axial collection or mass lesion/mass effect. Vascular: There is minimal atherosclerotic calcification of the internal carotid arteries. No hyperdense vessels. Skull: Normal. Negative for fracture or focal lesion. Sinuses/Orbits: No acute finding. Other: None. CT CERVICAL SPINE FINDINGS Alignment: There is reversal of cervical lordosis, centered at C5 and consistent with degenerative changes. Otherwise alignment is normal. Skull base and vertebrae: No acute fracture. No primary bone lesion or focal pathologic process. Soft tissues and spinal canal: No prevertebral fluid or swelling. No visible canal hematoma. Disc levels:  Significant disc height loss at C4-5 and C5-6. Upper chest: Negative. Other: None IMPRESSION: 1. No evidence for acute intracranial abnormality. 2. No evidence for acute cervical spine abnormality. 3. Degenerative changes in the mid cervical spine. Electronically Signed   By: Norva PavlovElizabeth  Brown M.D.   On: 01/29/2020 13:56   CT Cervical Spine Wo  Contrast  Result Date: 01/29/2020 CLINICAL DATA:  Larey SeatFell 2 weeks ago and hit forehead on bedside table. Denies loss of consciousness. LEFT-sided head and neck pain. EXAM: CT HEAD WITHOUT CONTRAST CT CERVICAL SPINE WITHOUT CONTRAST TECHNIQUE: Multidetector CT imaging of the head and cervical spine was performed following the standard protocol without intravenous contrast. Multiplanar CT image reconstructions of the cervical spine were also generated. COMPARISON:  04/04/2018 FINDINGS: CT HEAD FINDINGS Brain: No evidence of acute infarction, hemorrhage, hydrocephalus, extra-axial collection or mass lesion/mass effect. Vascular: There is minimal atherosclerotic calcification of the internal carotid arteries. No hyperdense vessels. Skull: Normal. Negative for fracture or focal lesion. Sinuses/Orbits: No acute finding. Other: None. CT CERVICAL SPINE FINDINGS Alignment: There is reversal of cervical lordosis, centered at C5 and consistent with degenerative changes. Otherwise alignment is normal. Skull base and vertebrae: No acute fracture. No primary bone lesion or focal pathologic process. Soft tissues and spinal canal: No prevertebral fluid or swelling. No visible canal hematoma. Disc levels:  Significant disc height loss at C4-5 and C5-6. Upper chest: Negative. Other: None IMPRESSION: 1. No evidence for acute intracranial abnormality. 2. No evidence for acute cervical spine abnormality. 3. Degenerative changes in the mid cervical spine. Electronically Signed   By: Norva PavlovElizabeth  Brown M.D.   On: 01/29/2020 13:56   DG Knee Complete 4 Views Left  Result Date: 01/29/2020 CLINICAL DATA:  Left knee pain after multiple falls. EXAM: LEFT KNEE - COMPLETE 4+ VIEW COMPARISON:  January 17, 2020. FINDINGS: Status post left total knee arthroplasty. The left femoral and tibial components appear to  be well situated. No effusion is noted. No fracture or dislocation is noted. IMPRESSION: Status post left total knee arthroplasty.  Electronically Signed   By: Lupita Raider M.D.   On: 01/29/2020 13:25    Procedures Procedures (including critical care time)  Medications Ordered in ED Medications  sodium chloride 0.9 % bolus 500 mL (0 mLs Intravenous Stopped 01/29/20 1605)    ED Course  I have reviewed the triage vital signs and the nursing notes.  Pertinent labs & imaging results that were available during my care of the patient were reviewed by me and considered in my medical decision making (see chart for details).  64 year old female presents for evaluation of falls.  Apparently had 3 falls within 1 week 2 to 3 weeks ago.  Was seen at [facility].  According to patient she did not have imaging of her left knee or her neck and she has had subsequent pain since then.  She has not had any additional falls since she was seen.  She is actually been evaluated by her PCP as well as cardiology since then.  They thought her falls were likely due to orthostatic hypotension as per chart review had systolic blood pressure in the 90s while at the office.  Was told to increase her fluids salt.  Patient denies any lightheadedness or dizziness.  She has a nonfocal neuro exam without deficits.  Has well-healed left knee incision from prior surgical intervention "years ago.".  She is able to flex and extend at bilateral upper and lower extremities however pain with extension to left knee.  Patient states that this is worse when "the weather gets bad."  Seems that she has some chronic arthritic changes in this knee which seems to be the likely cause of her pain as she states she has had this for "years."  States she was told to come here to have further imaging of her left knee and neck.  Plan labs, imaging and reassess.  Labs, imaging personally reviewed and interpreted: CBC without leukocytosis, hemoglobin 10.0 no recent to compare however appears to range from 10 to 11 years ago Metabolic panel with mild hyperglycemia at 120, has  been given IV fluids Urinalysis negative for infection.  Does have blood in her urine however patient denies any dysuria, flank pain.  Will refer outpatient to PCP for further work-up of hematuria EKG without any ischemic changes, similar to previous CT head without any acute findings CT cervical without acute findings Plain film knee without fracture, dislocation or effusion.  Low suspicion for occult fracture.  Patient reassessed.  Orthostatic vital signs negative.  She is tolerating p.o. intake without difficulty.  Apears overall well.  We will have her follow-up outpatient with her PCP for further work-up.  Has not had any additional falls over the last 2 weeks and according to her last cardiology note within the last week patient's lightheadedness which she previously had has resolved.  She also has had significant provement in her blood pressure.  Question resolved orthostatic hypotension from possible dehydration.  Low suspicion for acute cardiac, pulmonary, vascular etiology as all of her falls seem to be mechanical in nature.  No evidence of acute traumatic injuries.  The patient has been appropriately medically screened and/or stabilized in the ED. I have low suspicion for any other emergent medical condition which would require further screening, evaluation or treatment in the ED or require inpatient management.  Patient is hemodynamically stable and in no acute distress.  Patient able to ambulate in department prior to ED.  Evaluation does not show acute pathology that would require ongoing or additional emergent interventions while in the emergency department or further inpatient treatment.  I have discussed the diagnosis with the patient and answered all questions.  Pain is been managed while in the emergency department and patient has no further complaints prior to discharge.  Patient is comfortable with plan discussed in room and is stable for discharge at this time.  I have discussed  strict return precautions for returning to the emergency department.  Patient was encouraged to follow-up with PCP/specialist refer to at discharge.     MDM Rules/Calculators/A&P                       Final Clinical Impression(s) / ED Diagnoses Final diagnoses:  Fall, subsequent encounter  Chronic pain of left knee  Neck sprain, subsequent encounter    Rx / DC Orders ED Discharge Orders    None       Quenton Recendez A, PA-C 01/29/20 1613    Benjiman Core, MD 01/30/20 [phone]

## 2020-02-06 ENCOUNTER — Encounter: Payer: Self-pay | Admitting: Family Medicine

## 2020-02-26 DIAGNOSIS — G894 Chronic pain syndrome: Secondary | ICD-10-CM | POA: Diagnosis not present

## 2020-02-26 DIAGNOSIS — I951 Orthostatic hypotension: Secondary | ICD-10-CM | POA: Diagnosis not present

## 2020-02-26 DIAGNOSIS — M545 Low back pain: Secondary | ICD-10-CM | POA: Diagnosis not present

## 2020-02-26 DIAGNOSIS — R296 Repeated falls: Secondary | ICD-10-CM | POA: Diagnosis not present

## 2020-02-26 DIAGNOSIS — M542 Cervicalgia: Secondary | ICD-10-CM | POA: Diagnosis not present

## 2020-04-12 DIAGNOSIS — R0602 Shortness of breath: Secondary | ICD-10-CM | POA: Diagnosis not present

## 2020-04-12 DIAGNOSIS — R000 Tachycardia, unspecified: Secondary | ICD-10-CM | POA: Diagnosis not present

## 2020-04-12 DIAGNOSIS — R52 Pain, unspecified: Secondary | ICD-10-CM | POA: Diagnosis not present

## 2020-04-21 DIAGNOSIS — J069 Acute upper respiratory infection, unspecified: Secondary | ICD-10-CM | POA: Diagnosis not present

## 2020-04-22 DIAGNOSIS — G894 Chronic pain syndrome: Secondary | ICD-10-CM | POA: Diagnosis not present

## 2020-04-22 DIAGNOSIS — M545 Low back pain: Secondary | ICD-10-CM | POA: Diagnosis not present

## 2020-04-22 DIAGNOSIS — M542 Cervicalgia: Secondary | ICD-10-CM | POA: Diagnosis not present

## 2020-04-22 DIAGNOSIS — Z79891 Long term (current) use of opiate analgesic: Secondary | ICD-10-CM | POA: Diagnosis not present

## 2020-04-22 DIAGNOSIS — R296 Repeated falls: Secondary | ICD-10-CM | POA: Diagnosis not present

## 2020-04-22 DIAGNOSIS — I951 Orthostatic hypotension: Secondary | ICD-10-CM | POA: Diagnosis not present

## 2020-04-28 DIAGNOSIS — R197 Diarrhea, unspecified: Secondary | ICD-10-CM | POA: Diagnosis not present

## 2020-04-28 DIAGNOSIS — J069 Acute upper respiratory infection, unspecified: Secondary | ICD-10-CM | POA: Diagnosis not present

## 2020-05-01 ENCOUNTER — Other Ambulatory Visit (HOSPITAL_BASED_OUTPATIENT_CLINIC_OR_DEPARTMENT_OTHER): Payer: Self-pay

## 2020-05-01 DIAGNOSIS — G4733 Obstructive sleep apnea (adult) (pediatric): Secondary | ICD-10-CM

## 2020-05-14 ENCOUNTER — Other Ambulatory Visit: Payer: Self-pay

## 2020-05-14 ENCOUNTER — Ambulatory Visit: Payer: Medicare Other | Attending: Neurology | Admitting: Neurology

## 2020-05-14 DIAGNOSIS — Z79899 Other long term (current) drug therapy: Secondary | ICD-10-CM | POA: Insufficient documentation

## 2020-05-14 DIAGNOSIS — Z7984 Long term (current) use of oral hypoglycemic drugs: Secondary | ICD-10-CM | POA: Insufficient documentation

## 2020-05-14 DIAGNOSIS — G4733 Obstructive sleep apnea (adult) (pediatric): Secondary | ICD-10-CM | POA: Diagnosis not present

## 2020-05-20 NOTE — Procedures (Signed)
[facility] Garnette Greb A. Gerilyn Pilgrim, MD     [email or web address]             NOCTURNAL POLYSOMNOGRAPHY   LOCATION: [facility]  Patient Name: Cindy Norris, Cindy Norris Date: 05/14/2020 Gender: Female D.O.B: 1956/02/17 Age (years): 52 Referring Provider: Beryle Beams MD, ABSM Height (inches): 66 Interpreting Physician: Beryle Beams MD, ABSM Weight (lbs): 202 RPSGT: Peak, Robert BMI: 33 MRN: [id number] Neck Size: 16.00 CLINICAL INFORMATION Sleep Study Type: NPSG     Indication for sleep study: N/A     Epworth Sleepiness Score: 19     SLEEP STUDY TECHNIQUE As per the AASM Manual for the Scoring of Sleep and Associated Events v2.3 (April 2016) with a hypopnea requiring 4% desaturations.  The channels recorded and monitored were frontal, central and occipital EEG, electrooculogram (EOG), submentalis EMG (chin), nasal and oral airflow, thoracic and abdominal wall motion, anterior tibialis EMG, snore microphone, electrocardiogram, and pulse oximetry.  MEDICATIONS Medications self-administered by patient taken the night of the study : N/A  Current Outpatient Medications:  .  albuterol (VENTOLIN HFA) 108 (90 Base) MCG/ACT inhaler, Inhale 1-2 puffs into the lungs as needed for wheezing or shortness of breath., Disp: , Rfl:  .  atorvastatin (LIPITOR) 80 MG tablet, Take 80 mg by mouth daily.  , Disp: , Rfl:  .  busPIRone (BUSPAR) 10 MG tablet, Take 10 mg by mouth 2 (two) times daily., Disp: , Rfl:  .  diclofenac sodium (VOLTAREN) 1 % GEL, Apply 2 g topically 4 (four) times daily as needed (pain)., Disp: , Rfl:  .  Dulaglutide 0.75 MG/0.5ML SOPN, Inject into the skin. 0.75mg  every Wednesday., Disp: , Rfl:  .  gabapentin (NEURONTIN) 800 MG tablet, Take 800 mg by mouth 2 (two) times daily. , Disp: , Rfl:  .  HYDROcodone-acetaminophen (NORCO/VICODIN) 5-325 MG tablet, Take 1 tablet by mouth daily as needed for moderate pain., Disp: , Rfl:  .  hydroxypropyl methylcellulose /  hypromellose (ISOPTO TEARS / GONIOVISC) 2.5 % ophthalmic solution, Place 1 drop into both eyes 3 (three) times daily as needed for dry eyes., Disp: , Rfl:  .  hydrOXYzine (ATARAX/VISTARIL) 25 MG tablet, Take 25 mg by mouth 3 (three) times daily as needed for anxiety., Disp: , Rfl:  .  ibuprofen (ADVIL,MOTRIN) 800 MG tablet, Take 800 mg by mouth every 8 (eight) hours as needed for moderate pain., Disp: , Rfl:  .  levETIRAcetam (KEPPRA) 1000 MG tablet, Take 1,000 mg by mouth 2 (two) times daily., Disp: , Rfl:  .  metFORMIN (GLUCOPHAGE-XR) 500 MG 24 hr tablet, Take 500 mg by mouth daily. , Disp: , Rfl:  .  modafinil (PROVIGIL) 200 MG tablet, Take 400 mg by mouth every morning. , Disp: , Rfl:  .  nortriptyline (PAMELOR) 25 MG capsule, Take 25 mg by mouth at bedtime., Disp: , Rfl:  .  omeprazole (PRILOSEC) 20 MG capsule, Take 20 mg by mouth daily. , Disp: , Rfl:  .  oxybutynin (DITROPAN) 5 MG tablet, Take 5 mg by mouth 3 (three) times daily., Disp: , Rfl:  .  potassium chloride SA (K-DUR,KLOR-CON) 20 MEQ tablet, Take 20 mEq by mouth 2 (two) times daily., Disp: , Rfl:  .  SUMAtriptan (IMITREX) 50 MG tablet, Take 50-100 mg by mouth every 2 (two) hours as needed for migraine. May repeat in 2 hours if headache persists or recurs., Disp: , Rfl:  .  topiramate (TOPAMAX) 100 MG tablet, Take 100 mg by mouth 2 (two) times daily., Disp: ,  Rfl:  .  venlafaxine XR (EFFEXOR-XR) 150 MG 24 hr capsule, Take 150 mg by mouth 2 (two) times daily., Disp: , Rfl:      SLEEP ARCHITECTURE The study was initiated at 10:26:18 PM and ended at 4:55:33 AM.  Sleep onset time was 19.7 minutes and the sleep efficiency was 90.6%%. The total sleep time was 352.5 minutes.  Stage REM latency was N/A minutes.  The patient spent 6.1%% of the night in stage N1 sleep, 85.2%% in stage N2 sleep, 8.7%% in stage N3 and 0% in REM.  Alpha intrusion was absent.  Supine sleep was 0.00%.  RESPIRATORY PARAMETERS The overall apnea/hypopnea  index (AHI) was 4.6 per hour. There were 13 total apneas, including 4 obstructive, 9 central and 0 mixed apneas. There were 14 hypopneas and 44 RERAs.  The AHI during Stage REM sleep was N/A per hour.  AHI while supine was N/A per hour.  The mean oxygen saturation was 93.5%. The minimum SpO2 during sleep was 86.0%.  moderate snoring was noted during this study.  CARDIAC DATA The 2 lead EKG demonstrated sinus rhythm. The mean heart rate was 73.7 beats per minute. Other EKG findings include: None.  LEG MOVEMENT DATA The total PLMS were 7 with a resulting PLMS index of 1.2. Associated arousal with leg movement index was 0.3.  IMPRESSIONS No significant obstructive sleep apnea occurred during this study. No significant central sleep apnea occurred during this study.   Argie Ramming, MD Diplomate, American Board of Sleep Medicine. ELECTRONICALLY SIGNED ON:  05/20/2020, 8:53 AM [facility] PH: [phone]   FX: [phone] ACCREDITED BY THE AMERICAN ACADEMY OF SLEEP MEDICINE

## 2020-06-17 DIAGNOSIS — R296 Repeated falls: Secondary | ICD-10-CM | POA: Diagnosis not present

## 2020-06-17 DIAGNOSIS — G894 Chronic pain syndrome: Secondary | ICD-10-CM | POA: Diagnosis not present

## 2020-06-17 DIAGNOSIS — M542 Cervicalgia: Secondary | ICD-10-CM | POA: Diagnosis not present

## 2020-06-17 DIAGNOSIS — M545 Low back pain: Secondary | ICD-10-CM | POA: Diagnosis not present

## 2020-06-17 DIAGNOSIS — Z79891 Long term (current) use of opiate analgesic: Secondary | ICD-10-CM | POA: Diagnosis not present

## 2020-06-17 DIAGNOSIS — I951 Orthostatic hypotension: Secondary | ICD-10-CM | POA: Diagnosis not present

## 2020-06-18 DIAGNOSIS — I10 Essential (primary) hypertension: Secondary | ICD-10-CM | POA: Diagnosis not present

## 2020-06-18 DIAGNOSIS — R5383 Other fatigue: Secondary | ICD-10-CM | POA: Diagnosis not present

## 2020-06-18 DIAGNOSIS — D509 Iron deficiency anemia, unspecified: Secondary | ICD-10-CM | POA: Diagnosis not present

## 2020-06-18 DIAGNOSIS — K219 Gastro-esophageal reflux disease without esophagitis: Secondary | ICD-10-CM | POA: Diagnosis not present

## 2020-06-18 DIAGNOSIS — Z13220 Encounter for screening for lipoid disorders: Secondary | ICD-10-CM | POA: Diagnosis not present

## 2020-06-18 DIAGNOSIS — E611 Iron deficiency: Secondary | ICD-10-CM | POA: Diagnosis not present

## 2020-06-18 DIAGNOSIS — E1165 Type 2 diabetes mellitus with hyperglycemia: Secondary | ICD-10-CM | POA: Diagnosis not present

## 2020-06-19 DIAGNOSIS — R5381 Other malaise: Secondary | ICD-10-CM | POA: Diagnosis not present

## 2020-06-19 DIAGNOSIS — W19XXXA Unspecified fall, initial encounter: Secondary | ICD-10-CM | POA: Diagnosis not present

## 2020-06-23 DIAGNOSIS — Z23 Encounter for immunization: Secondary | ICD-10-CM | POA: Diagnosis not present

## 2020-06-23 DIAGNOSIS — E1165 Type 2 diabetes mellitus with hyperglycemia: Secondary | ICD-10-CM | POA: Diagnosis not present

## 2020-06-23 DIAGNOSIS — I10 Essential (primary) hypertension: Secondary | ICD-10-CM | POA: Diagnosis not present

## 2020-06-23 DIAGNOSIS — K219 Gastro-esophageal reflux disease without esophagitis: Secondary | ICD-10-CM | POA: Diagnosis not present

## 2020-07-14 DIAGNOSIS — G894 Chronic pain syndrome: Secondary | ICD-10-CM | POA: Diagnosis not present

## 2020-07-14 DIAGNOSIS — I951 Orthostatic hypotension: Secondary | ICD-10-CM | POA: Diagnosis not present

## 2020-07-14 DIAGNOSIS — M542 Cervicalgia: Secondary | ICD-10-CM | POA: Diagnosis not present

## 2020-07-14 DIAGNOSIS — M545 Low back pain, unspecified: Secondary | ICD-10-CM | POA: Diagnosis not present

## 2020-07-14 DIAGNOSIS — R296 Repeated falls: Secondary | ICD-10-CM | POA: Diagnosis not present

## 2020-07-28 IMAGING — CT CT CERVICAL SPINE W/O CM
3 of 4 series · 13 of 35 positions shown, 16 images · non-contrast
Comparison: 04/04/2018

CLINICAL DATA: Fell 2 weeks ago and hit forehead on bedside table.
Denies loss of consciousness. LEFT-sided head and neck pain.

EXAM:
CT HEAD WITHOUT CONTRAST
CT CERVICAL SPINE WITHOUT CONTRAST
TECHNIQUE: Multidetector CT imaging of the head and cervical spine was
performed following the standard protocol without intravenous
contrast. Multiplanar CT image reconstructions of the cervical spine
were also generated.

[Series 3: sagittal bone · sagittal · 0.33mm/px · 5 of 61 slices shown, 6 images]
[im 21/61  bone]
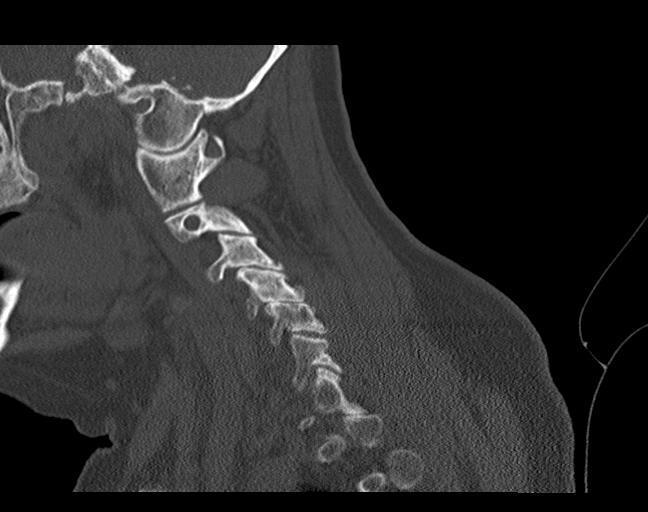
[im 26/61  bone]
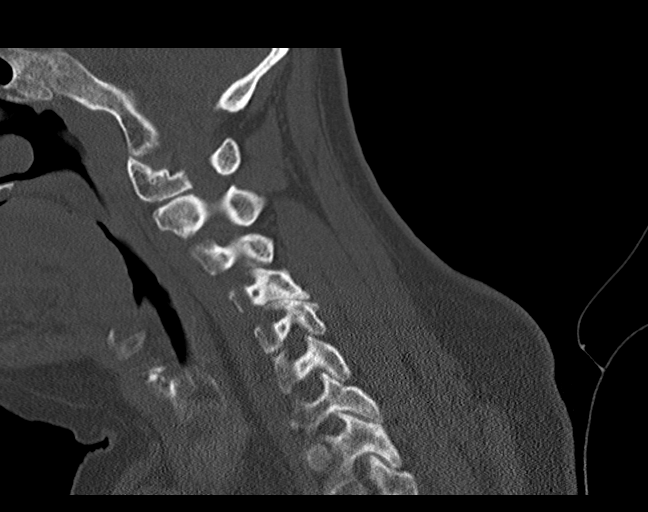
[im 31/61  soft-tissue]
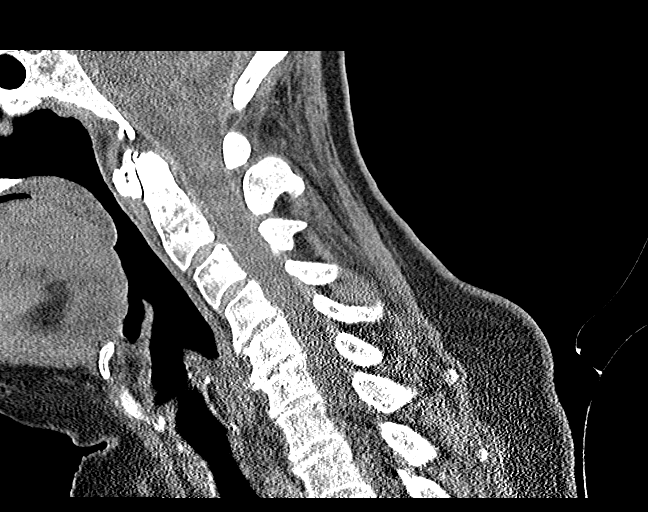
[im 31/61  bone]
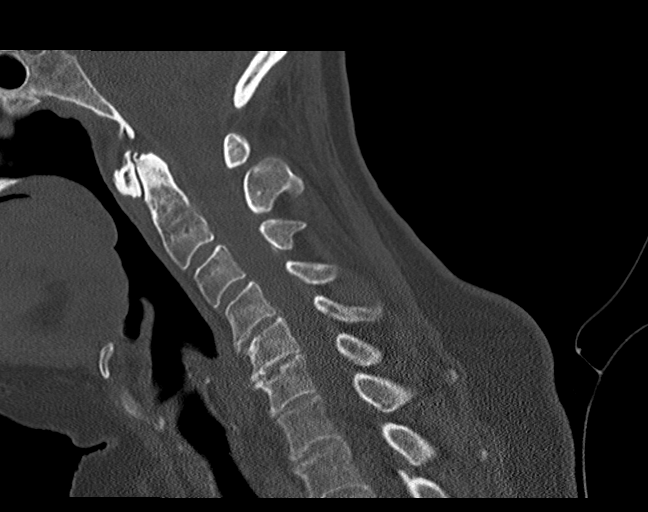
[im 36/61  bone]
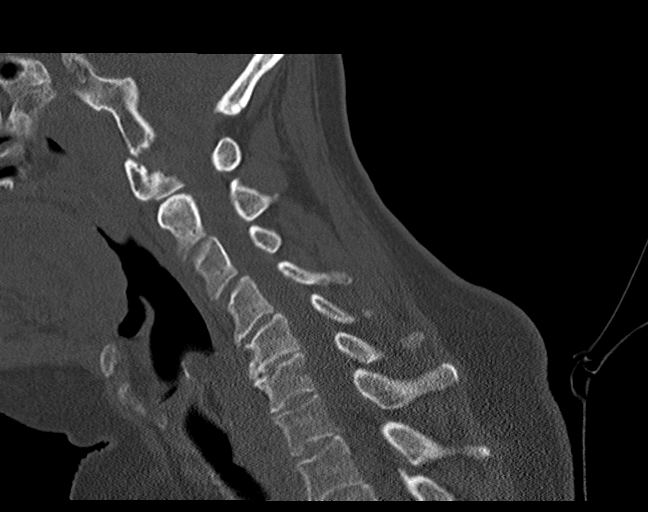
[im 41/61  bone]
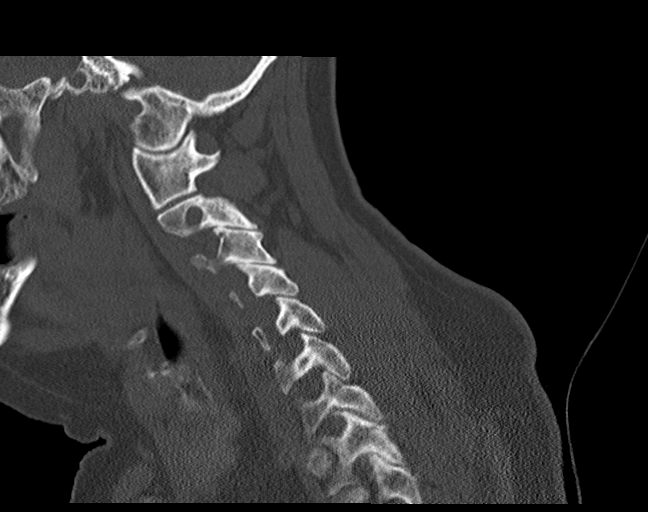

[Series 6: coronal bone · coronal · 0.23mm/px · 3 of 61 slices shown]
[im 14/61  bone]
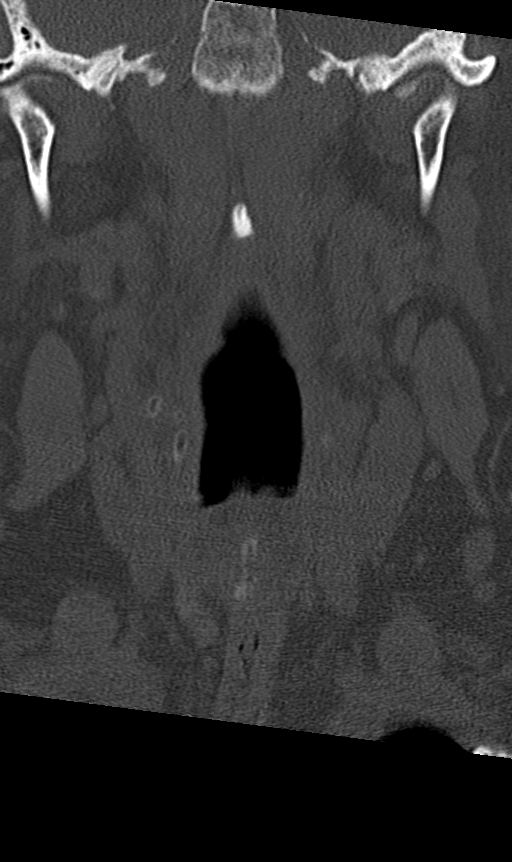
[im 25/61  bone]
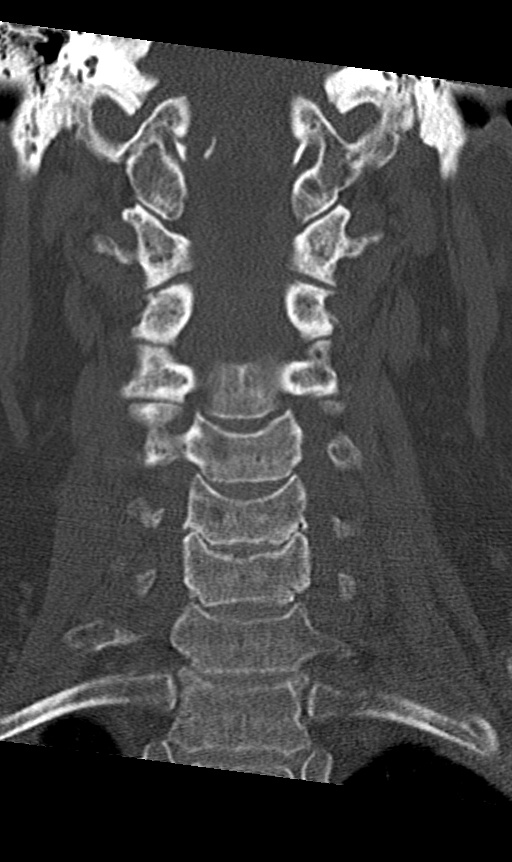
[im 36/61  bone]
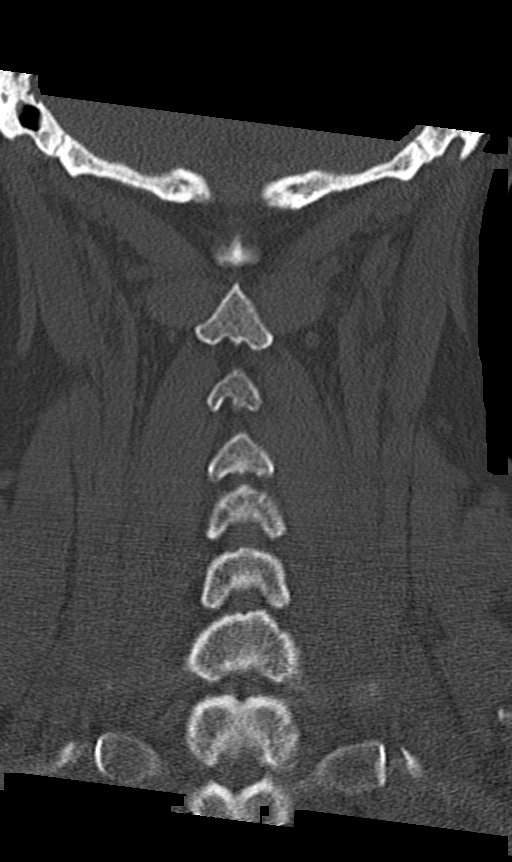

[Series 7: orthogonal axials · axial · 0.21mm/px · z∈[-72,+9]mm · 5 of 76 slices shown, 7 images]
[im 13/76  soft-tissue]
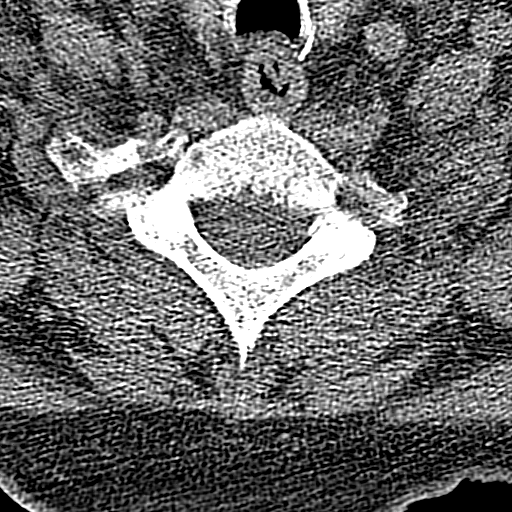
[im 13/76  bone]
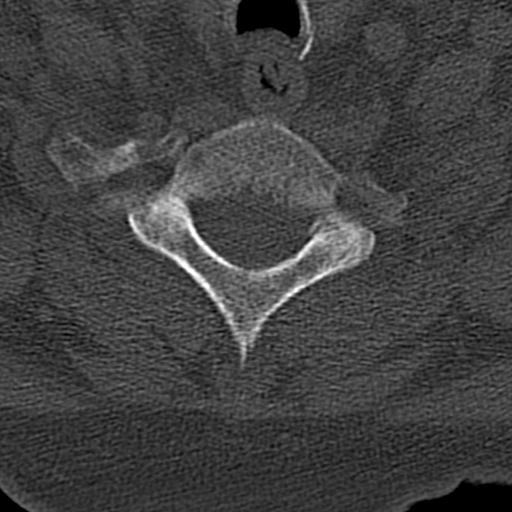
[im 26/76  bone]
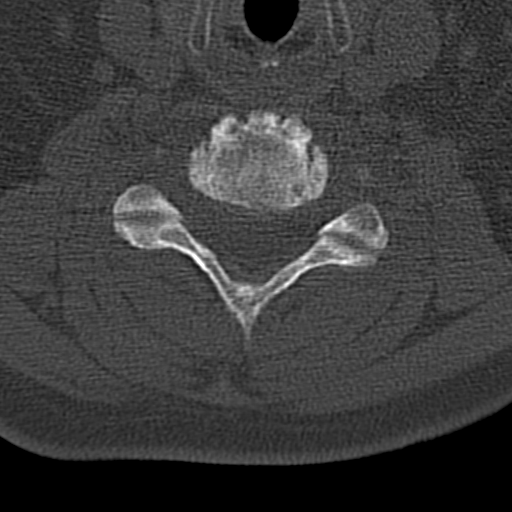
[im 38/76  bone]
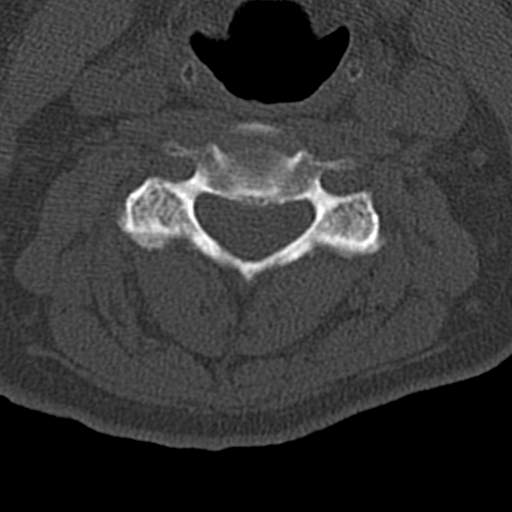
[im 51/76  bone]
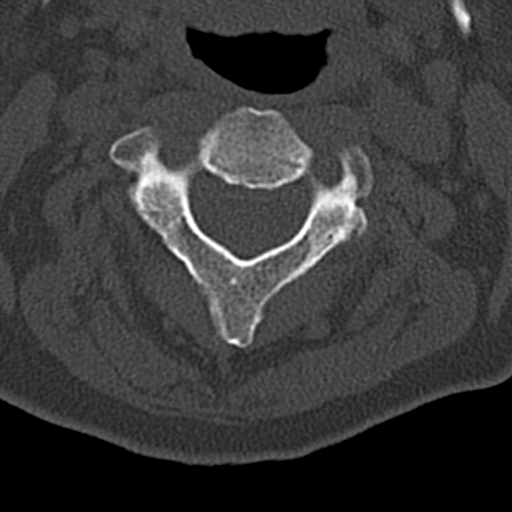
[im 63/76  soft-tissue]
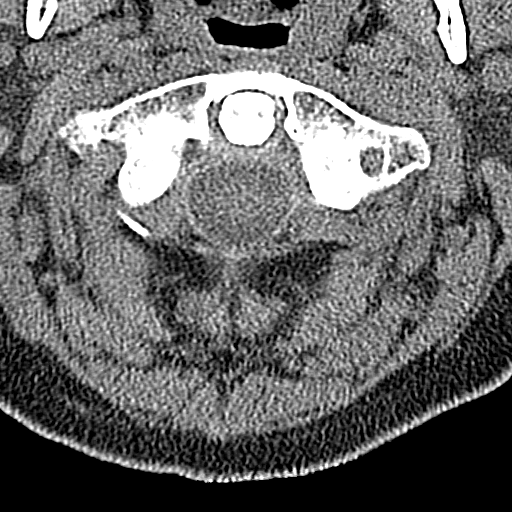
[im 63/76  bone]
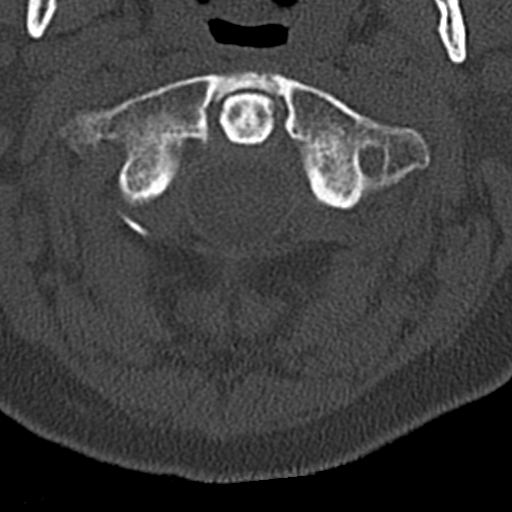

[13 of 35 positions shown; findings below may reference images not displayed]

FINDINGS: CT HEAD FINDINGS

Brain: No evidence of acute infarction, hemorrhage, hydrocephalus,
extra-axial collection or mass lesion/mass effect.

Vascular: There is minimal atherosclerotic calcification of the
internal carotid arteries. No hyperdense vessels.

Skull: Normal. Negative for fracture or focal lesion.

Sinuses/Orbits: No acute finding.

Other: None.

CT CERVICAL SPINE FINDINGS

Alignment: There is reversal of cervical lordosis, centered at C5
and consistent with degenerative changes. Otherwise alignment is
normal.

Skull base and vertebrae: No acute fracture. No primary bone lesion
or focal pathologic process.

Soft tissues and spinal canal: No prevertebral fluid or swelling. No
visible canal hematoma.

Disc levels:  Significant disc height loss at C4-5 and C5-6.

Upper chest: Negative.

Other: None
IMPRESSION: 1. No evidence for acute intracranial abnormality.
2. No evidence for acute cervical spine abnormality.
3. Degenerative changes in the mid cervical spine.

## 2020-08-12 DIAGNOSIS — R3129 Other microscopic hematuria: Secondary | ICD-10-CM | POA: Diagnosis not present

## 2020-08-12 DIAGNOSIS — R300 Dysuria: Secondary | ICD-10-CM | POA: Diagnosis not present

## 2020-09-08 DIAGNOSIS — M545 Low back pain, unspecified: Secondary | ICD-10-CM | POA: Diagnosis not present

## 2020-09-08 DIAGNOSIS — M542 Cervicalgia: Secondary | ICD-10-CM | POA: Diagnosis not present

## 2020-09-08 DIAGNOSIS — G43701 Chronic migraine without aura, not intractable, with status migrainosus: Secondary | ICD-10-CM | POA: Diagnosis not present

## 2020-09-08 DIAGNOSIS — I951 Orthostatic hypotension: Secondary | ICD-10-CM | POA: Diagnosis not present

## 2020-09-29 DIAGNOSIS — Z96651 Presence of right artificial knee joint: Secondary | ICD-10-CM | POA: Diagnosis not present

## 2020-09-29 DIAGNOSIS — E1165 Type 2 diabetes mellitus with hyperglycemia: Secondary | ICD-10-CM | POA: Diagnosis not present

## 2020-09-29 DIAGNOSIS — R109 Unspecified abdominal pain: Secondary | ICD-10-CM | POA: Diagnosis not present

## 2020-09-29 DIAGNOSIS — M25561 Pain in right knee: Secondary | ICD-10-CM | POA: Diagnosis not present

## 2020-09-29 DIAGNOSIS — G8911 Acute pain due to trauma: Secondary | ICD-10-CM | POA: Diagnosis not present

## 2020-10-07 DIAGNOSIS — W19XXXA Unspecified fall, initial encounter: Secondary | ICD-10-CM | POA: Diagnosis not present

## 2020-10-07 DIAGNOSIS — J069 Acute upper respiratory infection, unspecified: Secondary | ICD-10-CM | POA: Diagnosis not present

## 2020-10-07 DIAGNOSIS — Z96651 Presence of right artificial knee joint: Secondary | ICD-10-CM | POA: Diagnosis not present

## 2020-10-07 DIAGNOSIS — M25561 Pain in right knee: Secondary | ICD-10-CM | POA: Diagnosis not present

## 2020-10-19 DIAGNOSIS — W19XXXD Unspecified fall, subsequent encounter: Secondary | ICD-10-CM | POA: Diagnosis not present

## 2020-10-19 DIAGNOSIS — M25561 Pain in right knee: Secondary | ICD-10-CM | POA: Diagnosis not present

## 2020-10-19 DIAGNOSIS — Z96651 Presence of right artificial knee joint: Secondary | ICD-10-CM | POA: Diagnosis not present

## 2020-10-27 DIAGNOSIS — Z13220 Encounter for screening for lipoid disorders: Secondary | ICD-10-CM | POA: Diagnosis not present

## 2020-10-27 DIAGNOSIS — R5383 Other fatigue: Secondary | ICD-10-CM | POA: Diagnosis not present

## 2020-10-27 DIAGNOSIS — I10 Essential (primary) hypertension: Secondary | ICD-10-CM | POA: Diagnosis not present

## 2020-10-27 DIAGNOSIS — E1165 Type 2 diabetes mellitus with hyperglycemia: Secondary | ICD-10-CM | POA: Diagnosis not present

## 2020-10-27 DIAGNOSIS — K219 Gastro-esophageal reflux disease without esophagitis: Secondary | ICD-10-CM | POA: Diagnosis not present

## 2020-11-02 DIAGNOSIS — E785 Hyperlipidemia, unspecified: Secondary | ICD-10-CM | POA: Diagnosis not present

## 2020-11-02 DIAGNOSIS — E1165 Type 2 diabetes mellitus with hyperglycemia: Secondary | ICD-10-CM | POA: Diagnosis not present

## 2020-11-02 DIAGNOSIS — M79641 Pain in right hand: Secondary | ICD-10-CM | POA: Diagnosis not present

## 2020-11-02 DIAGNOSIS — D649 Anemia, unspecified: Secondary | ICD-10-CM | POA: Diagnosis not present

## 2020-11-10 DIAGNOSIS — M25561 Pain in right knee: Secondary | ICD-10-CM | POA: Diagnosis not present

## 2020-11-10 DIAGNOSIS — Z96651 Presence of right artificial knee joint: Secondary | ICD-10-CM | POA: Diagnosis not present

## 2020-11-10 DIAGNOSIS — Z471 Aftercare following joint replacement surgery: Secondary | ICD-10-CM | POA: Diagnosis not present

## 2020-11-16 DIAGNOSIS — W19XXXD Unspecified fall, subsequent encounter: Secondary | ICD-10-CM | POA: Diagnosis not present

## 2020-11-16 DIAGNOSIS — Z96651 Presence of right artificial knee joint: Secondary | ICD-10-CM | POA: Diagnosis not present

## 2020-11-16 DIAGNOSIS — M25561 Pain in right knee: Secondary | ICD-10-CM | POA: Diagnosis not present

## 2020-11-18 DIAGNOSIS — M25561 Pain in right knee: Secondary | ICD-10-CM | POA: Diagnosis not present

## 2020-11-18 DIAGNOSIS — R2681 Unsteadiness on feet: Secondary | ICD-10-CM | POA: Diagnosis not present

## 2020-11-18 DIAGNOSIS — Z96651 Presence of right artificial knee joint: Secondary | ICD-10-CM | POA: Diagnosis not present

## 2020-11-20 DIAGNOSIS — M25561 Pain in right knee: Secondary | ICD-10-CM | POA: Diagnosis not present

## 2020-11-20 DIAGNOSIS — Z96651 Presence of right artificial knee joint: Secondary | ICD-10-CM | POA: Diagnosis not present

## 2020-11-20 DIAGNOSIS — R2681 Unsteadiness on feet: Secondary | ICD-10-CM | POA: Diagnosis not present

## 2020-11-23 DIAGNOSIS — R2681 Unsteadiness on feet: Secondary | ICD-10-CM | POA: Diagnosis not present

## 2020-11-23 DIAGNOSIS — M25561 Pain in right knee: Secondary | ICD-10-CM | POA: Diagnosis not present

## 2020-11-23 DIAGNOSIS — Z96651 Presence of right artificial knee joint: Secondary | ICD-10-CM | POA: Diagnosis not present

## 2021-02-25 ENCOUNTER — Encounter: Payer: Self-pay | Admitting: *Deleted

## 2021-02-26 ENCOUNTER — Telehealth: Payer: Self-pay | Admitting: Family Medicine

## 2021-02-26 ENCOUNTER — Other Ambulatory Visit: Payer: Self-pay

## 2021-02-26 ENCOUNTER — Other Ambulatory Visit: Payer: Self-pay | Admitting: Family Medicine

## 2021-02-26 ENCOUNTER — Ambulatory Visit (INDEPENDENT_AMBULATORY_CARE_PROVIDER_SITE_OTHER): Payer: 59

## 2021-02-26 ENCOUNTER — Ambulatory Visit (INDEPENDENT_AMBULATORY_CARE_PROVIDER_SITE_OTHER): Payer: 59 | Admitting: Family Medicine

## 2021-02-26 ENCOUNTER — Encounter: Payer: Self-pay | Admitting: *Deleted

## 2021-02-26 ENCOUNTER — Encounter: Payer: Self-pay | Admitting: Family Medicine

## 2021-02-26 VITALS — BP 132/62 | HR 93 | Ht 66.0 in | Wt 205.0 lb

## 2021-02-26 DIAGNOSIS — G4733 Obstructive sleep apnea (adult) (pediatric): Secondary | ICD-10-CM | POA: Diagnosis not present

## 2021-02-26 DIAGNOSIS — R002 Palpitations: Secondary | ICD-10-CM

## 2021-02-26 DIAGNOSIS — R42 Dizziness and giddiness: Secondary | ICD-10-CM

## 2021-02-26 DIAGNOSIS — R748 Abnormal levels of other serum enzymes: Secondary | ICD-10-CM

## 2021-02-26 DIAGNOSIS — R109 Unspecified abdominal pain: Secondary | ICD-10-CM

## 2021-02-26 DIAGNOSIS — E782 Mixed hyperlipidemia: Secondary | ICD-10-CM | POA: Diagnosis not present

## 2021-02-26 DIAGNOSIS — R079 Chest pain, unspecified: Secondary | ICD-10-CM

## 2021-02-26 DIAGNOSIS — R0602 Shortness of breath: Secondary | ICD-10-CM

## 2021-02-26 NOTE — Patient Instructions (Addendum)
Medication Instructions:   Your physician recommends that you continue on your current medications as directed. Please refer to the Current Medication list given to you today.  Labwork:  none  Testing/Procedures: Your physician has requested that you have an echocardiogram. Echocardiography is a painless test that uses sound waves to create images of your heart. It provides your doctor with information about the size and shape of your heart and how well your heart's chambers and valves are working. This procedure takes approximately one hour. There are no restrictions for this procedure. Your physician has requested that you have a lexiscan myoview. For further information please visit [email or web address] Please follow instruction sheet, as given. ZIO- Long Term Monitor Instructions   Your physician has requested you wear your ZIO patch monitor 14 days.   This is a single patch monitor.  [employer] supplies one patch monitor per enrollment.  Additional stickers are not available.   Please do not apply patch if you will be having a Nuclear Stress Test, Echocardiogram, Cardiac CT, MRI, or Chest Xray during the time frame you would be wearing the monitor. The patch cannot be worn during these tests.  You cannot remove and re-apply the ZIO XT patch monitor.     Once you have received you monitor, please review enclosed instructions.  Your monitor has already been registered assigning a specific monitor serial # to you.   Applying the monitor   Shave hair from upper left chest.   Hold abrader disc by orange tab.  Rub abrader in 40 strokes over left upper chest as indicated in your monitor instructions.   Clean area with 4 enclosed alcohol pads .  Use all pads to assure are is cleaned thoroughly.  Let dry.   Apply patch as indicated in monitor instructions.  Patch will be place under collarbone on left side of chest with arrow pointing upward.   Rub patch adhesive wings for 2 minutes.Remove  white label marked "1".  Remove white label marked "2".  Rub patch adhesive wings for 2 additional minutes.   While looking in a mirror, press and release button in center of patch.  A small green light will flash 3-4 times .  This will be your only indicator the monitor has been turned on.     Do not shower for the first 24 hours.  You may shower after the first 24 hours.   Press button if you feel a symptom. You will hear a small click.  Record Date, Time and Symptom in the Patient Log Book.   When you are ready to remove patch, follow instructions on last 2 pages of Patient Log Book.  Stick patch monitor onto last page of Patient Log Book.   Place Patient Log Book in [place] box.  Use locking tab on box and tape box closed securely.  The [employer] has [employer] on it.  Please place in mailbox as soon as possible.  Your physician should have your test results approximately 7 days after the monitor has been mailed back to [facility].   Call [facility] Customer Care at [phone] if you have questions regarding your ZIO XT patch monitor.  Call them immediately if you see an orange light blinking on your monitor.    If your monitor falls off in less than 4 days contact our Monitor department at [phone].  If your monitor becomes loose or falls off after 4 days call [employer] at [phone] for suggestions on securing your  monitor.  Follow-Up:  Your physician recommends that you schedule a follow-up appointment in: 3 months.  Any Other Special Instructions Will Be Listed Below (If Applicable).  You have been referred to a Gastroenterologist  If you need a refill on your cardiac medications before your next appointment, please call your pharmacy.

## 2021-02-26 NOTE — Telephone Encounter (Signed)
PERCERT  14 Day ZIO XT/  [facility] 03/16/2021

## 2021-02-26 NOTE — Telephone Encounter (Signed)
Error

## 2021-02-26 NOTE — Progress Notes (Signed)
Cardiology Office Note  Date: 02/26/2021   ID: Cindy Norris, DOB Dec 04, 1955, MRN [id number]  PCP:  [facility]  Cardiologist:  None Electrophysiologist:  None   Chief Complaint: Chest pain  History of Present Illness: Cindy Norris is a 65 y.o. female with a history of hyperlipidemia, obesity, seizure disorder, sleep apnea, GERD, chronic pain syndrome, palpitations.  She was last seen by Dr. Purvis Sheffield 01/21/2019 for palpitations.  She had her previous office visit and March 2020 with complaint of dizziness and problems with balance.  Blood pressure was 96/58 while sitting.  Her palpitations had improved.  She denied any chest pain.  She continued with chronic exertional dyspnea and fatigue.  She had a diagnosis of sleep apnea but was no longer using CPAP machine because it had stopped working.  Since palpitations and dizziness had subsided she was instructed to call the office should symptoms recur and would possibly explore cardiac testing.  He stated CPAP machine was broken and had not been using it.  She had been feeling fatigued to CPAP had stopped.  Recent presentation to [facility] on 02/06/2020 he was complaining of chest pain.  Pain began while at rest sitting on her porch.  Troponins were 6, 7, and 1, CBC was unremarkable proBNP was 41, magnesium 1.8, lipase 138, potassium 3.4, alkaline phosphatase 139.  Chest x-ray: Small granuloma in the right lower lobe, lungs elsewhere were clear, heart size and pulmonary vascularity were normal.  She is here for follow-up status post recent hospital stay.  She continues to complain of abdominal pain/epigastric pain/chest pain.  She states the pain comes and goes rated at 7 out of 10 when it occurs states it is short lasting without radiation to neck, arm, back or jaw.  States she does have some occasional nausea usually when it occurs.  Also complaining of some increased DOE recently.  States she has been having some  palpitations recently and and becomes dizzy when this occurs.  She states she has IBS on the continuum associated with diarrhea mostly.  She states she is never seen a gastroenterologist.  Blood pressure is well controlled at 132/62.  States she no longer uses CPAP.  Past Medical History:  Diagnosis Date  . Chronic pain syndrome   . Depression   . Diabetes mellitus   . GERD (gastroesophageal reflux disease)   . Hyperlipidemia, mixed   . Migraines   . Narcolepsy   . Osteoarthritis   . Overweight(278.02)    Obesity  . Seizures (HCC)   . Sleep apnea   . Unspecified essential hypertension     Past Surgical History:  Procedure Laterality Date  . KNEE ARTHROSCOPY    . MASTECTOMY     Right  . PILONIDAL CYST EXCISION    . TOTAL KNEE ARTHROPLASTY      Current Outpatient Medications  Medication Sig Dispense Refill  . albuterol (VENTOLIN HFA) 108 (90 Base) MCG/ACT inhaler Inhale 1-2 puffs into the lungs as needed for wheezing or shortness of breath.    Marland Kitchen atorvastatin (LIPITOR) 80 MG tablet Take 80 mg by mouth daily.    . busPIRone (BUSPAR) 10 MG tablet Take 10 mg by mouth 2 (two) times daily.    . diclofenac sodium (VOLTAREN) 1 % GEL Apply 2 g topically 4 (four) times daily as needed (pain).    . Dulaglutide 0.75 MG/0.5ML SOPN Inject into the skin. 0.75mg  every Wednesday.    . gabapentin (NEURONTIN) 800 MG tablet  Take 800 mg by mouth 2 (two) times daily.     Marland Kitchen HYDROcodone-acetaminophen (NORCO/VICODIN) 5-325 MG tablet Take 1 tablet by mouth daily as needed for moderate pain.    . hydroxypropyl methylcellulose / hypromellose (ISOPTO TEARS / GONIOVISC) 2.5 % ophthalmic solution Place 1 drop into both eyes 3 (three) times daily as needed for dry eyes.    . hydrOXYzine (ATARAX/VISTARIL) 25 MG tablet Take 25 mg by mouth 3 (three) times daily as needed for anxiety.    Marland Kitchen ibuprofen (ADVIL,MOTRIN) 800 MG tablet Take 800 mg by mouth every 8 (eight) hours as needed for moderate pain.    Marland Kitchen  levETIRAcetam (KEPPRA) 1000 MG tablet Take 1,000 mg by mouth 2 (two) times daily.    . metFORMIN (GLUCOPHAGE-XR) 500 MG 24 hr tablet Take 500 mg by mouth daily.     . modafinil (PROVIGIL) 200 MG tablet Take 400 mg by mouth every morning.     . nortriptyline (PAMELOR) 25 MG capsule Take 25 mg by mouth at bedtime.    Marland Kitchen omeprazole (PRILOSEC) 20 MG capsule Take 20 mg by mouth daily.    Marland Kitchen oxybutynin (DITROPAN) 5 MG tablet Take 5 mg by mouth 3 (three) times daily.    . SUMAtriptan (IMITREX) 50 MG tablet Take 50-100 mg by mouth every 2 (two) hours as needed for migraine. May repeat in 2 hours if headache persists or recurs.    . topiramate (TOPAMAX) 100 MG tablet Take 100 mg by mouth 2 (two) times daily.    Marland Kitchen venlafaxine XR (EFFEXOR-XR) 150 MG 24 hr capsule Take 150 mg by mouth 2 (two) times daily.     No current facility-administered medications for this visit.   Allergies:  Azithromycin, Other, Tape, Erythromycin, Hydromorphone hcl, Lyrica [pregabalin], Penicillins, Reglan [metoclopramide], and Sulfonamide derivatives   Social History: The patient  reports that she quit smoking about 32 years ago. She has never used smokeless tobacco. She reports that she does not drink alcohol and does not use drugs.   Family History: The patient's family history includes Coronary artery disease in an other family member.   ROS:  Please see the history of present illness. Otherwise, complete review of systems is positive for none.  All other systems are reviewed and negative.   Physical Exam: VS:  BP 132/62   Pulse 93   Ht 5\' 6"  (1.676 m)   Wt 205 lb (93 kg)   SpO2 95%   BMI 33.09 kg/m , BMI Body mass index is 33.09 kg/m.  Wt Readings from Last 3 Encounters:  02/26/21 205 lb (93 kg)  01/29/20 200 lb (90.7 kg)  01/21/19 174 lb (78.9 kg)    General: Patient appears comfortable at rest. Neck: Supple, no elevated JVP or carotid bruits, no thyromegaly. Lungs: Clear to auscultation, nonlabored breathing at  rest. Cardiac: Regular rate and rhythm, no S3 or significant systolic murmur, no pericardial rub. Extremities: No pitting edema, distal pulses 2+. Skin: Warm and dry. Musculoskeletal: No kyphosis. Neuropsychiatric: Alert and oriented x3, affect grossly appropriate.  ECG:  An ECG dated 02/26/2021 was personally reviewed today and demonstrated:  Normal sinus rhythm rate of 93 nonspecific T wave abnormality.  Recent Labwork: No results found for requested labs within last 8760 hours.  No results found for: CHOL, TRIG, HDL, CHOLHDL, VLDL, LDLCALC, LDLDIRECT  Other Studies Reviewed Today:   Assessment and Plan:  1. Palpitations   2. Dizziness   3. OSA (obstructive sleep apnea)   4. Mixed hyperlipidemia  5. SOB (shortness of breath)   6. Chest pain, unspecified type   7. Abdominal pain, unspecified abdominal location   8. Elevated lipase    1. Palpitations Complaining of increasing palpitations with associated dizziness recently.  Please get a 14-day ZIO monitor.  2. Dizziness Complaining of recent episodes of dizziness associated with palpitations.  Denies any near syncopal or syncopal episodes.  3. OSA (obstructive sleep apnea) Patient states she stopped using her CPAP.  She complains of chronic fatigue.   4. Mixed hyperlipidemia  Continue atorvastatin 80 mg daily.  5.  Abdominal pain/elevated lipase Complaining of abdominal pain/epigastric pain with recent hospital visit and elevated lipase.  She states she has a history of IBS with diarrhea.  She states she has never seen a gastroenterologist.  Please refer to [facility] Dr. Elder Love  6.  Dyspnea Complaining of increasing dyspnea over the last few months.  Increasing exertional fatigue with dyspnea.  Please get an echocardiogram to assess LV function, diastolic function and valvular function.  7.  Chest pain Recent admission for chest pain/abdominal pain with negative enzymes.  She has  multiple risk factors for heart disease including diabetes, hyperlipidemia, age.  Please schedule Lexiscan Myoview stress  Medication Adjustments/Labs and Tests Ordered: Current medicines are reviewed at length with the patient today.  Concerns regarding medicines are outlined above.   Disposition: Follow-up with Dr. Wyline Mood or APP 6 to 8 weeks  Signed, Rennis Harding, NP 02/26/2021 4:23 PM    [facility] [address] [place], [place], [place] [address] Phone: [phone]; Fax: [phone]

## 2021-03-01 ENCOUNTER — Encounter: Payer: Self-pay | Admitting: *Deleted

## 2021-03-03 ENCOUNTER — Other Ambulatory Visit: Payer: 59

## 2021-03-05 ENCOUNTER — Telehealth: Payer: Self-pay | Admitting: Family Medicine

## 2021-03-05 ENCOUNTER — Telehealth: Payer: Self-pay | Admitting: Cardiology

## 2021-03-05 NOTE — Telephone Encounter (Signed)
Pt wanting to mow her grass.  She does use riding [employer] but has 3 acres to [employer]. She still has chest pain.  I explained it may make the chest pain worse.  That it would be best to have someone else mow until we evaluated her chest pain.  Then if normal she could take back over.  She does have someone that could mow it for her.

## 2021-03-05 NOTE — Telephone Encounter (Signed)
Patient called wanting to know if she can mow yard on her riding [employer]. She can be reached at [phone] or [phone]

## 2021-03-05 NOTE — Telephone Encounter (Signed)
Patient has upcoming test scheduled -   Echo - 04/08/2021 Stress test - 03/16/2021 14 day zio monitor   I know she probably should avoid mowing while she is wearing the monitor.  Any other contraindications?

## 2021-03-05 NOTE — Telephone Encounter (Signed)
I guess everything else will be okay which she was complaining of chest pain.  If she is push mowing it could bring on chest pain.  I agree with not mowing the yard with the monitor on.  If she is not having significant shortness of breath or chest pain I guess it would depend on whether she riding on a lawn more or pushing a lawnmower as to which one will be contraindicated.  If she is using a riding [employer] then that would not be a contraindication for echo or stress.

## 2021-03-05 NOTE — Telephone Encounter (Signed)
Attempted to reach patient - vm not accepting calls.

## 2021-03-10 ENCOUNTER — Other Ambulatory Visit: Payer: Self-pay | Admitting: Family Medicine

## 2021-03-10 DIAGNOSIS — R0602 Shortness of breath: Secondary | ICD-10-CM

## 2021-03-10 DIAGNOSIS — R079 Chest pain, unspecified: Secondary | ICD-10-CM

## 2021-03-11 NOTE — Telephone Encounter (Signed)
Looks like this was already addressed on 03/05/2021.  Leone Brand, NP      6:51 PM Note Pt wanting to mow her grass.  She does use riding [employer] but has 3 acres to [employer]. She still has chest pain.  I explained it may make the chest pain worse.  That it would be best to have someone else mow until we evaluated her chest pain.  Then if normal she could take back over.  She does have someone that could mow it for her.

## 2021-03-12 ENCOUNTER — Encounter (HOSPITAL_COMMUNITY): Payer: 59

## 2021-03-16 ENCOUNTER — Encounter (HOSPITAL_COMMUNITY)
Admission: RE | Admit: 2021-03-16 | Discharge: 2021-03-16 | Disposition: A | Discharge status: Home / Self Care | Payer: 59 | Source: Ambulatory Visit | Attending: Family Medicine | Admitting: Family Medicine

## 2021-03-16 ENCOUNTER — Encounter (HOSPITAL_COMMUNITY): Payer: Self-pay

## 2021-03-16 ENCOUNTER — Ambulatory Visit (HOSPITAL_COMMUNITY)
Admission: RE | Admit: 2021-03-16 | Discharge: 2021-03-16 | Disposition: A | Discharge status: Home / Self Care | Payer: 59 | Source: Ambulatory Visit | Attending: Family Medicine | Admitting: Family Medicine

## 2021-03-16 DIAGNOSIS — R079 Chest pain, unspecified: Secondary | ICD-10-CM | POA: Insufficient documentation

## 2021-03-16 HISTORY — DX: Unspecified asthma, uncomplicated: J45.909

## 2021-03-16 LAB — NM MYOCAR MULTI W/SPECT W/WALL MOTION / EF
LV dias vol: 110 mL (ref 46–106)
LV sys vol: 57 mL
Peak HR: 98 {beats}/min
RATE: 0.39
Rest HR: 73 {beats}/min
SDS: 1
SRS: 1
SSS: 2
TID: 1.06

## 2021-03-16 MED ORDER — TECHNETIUM TC 99M TETROFOSMIN IV KIT
10.0000 | PACK | Freq: Once | INTRAVENOUS | Status: AC | PRN
  Administered 2021-03-16: 10.5 via INTRAVENOUS

## 2021-03-16 MED ORDER — TECHNETIUM TC 99M TETROFOSMIN IV KIT
30.0000 | PACK | Freq: Once | INTRAVENOUS | Status: AC | PRN
  Administered 2021-03-16: 32 via INTRAVENOUS

## 2021-03-16 MED ORDER — SODIUM CHLORIDE FLUSH 0.9 % IV SOLN
INTRAVENOUS | Status: AC
  Administered 2021-03-16: 10 mL via INTRAVENOUS
  Filled 2021-03-16: qty 10

## 2021-03-16 MED ORDER — REGADENOSON 0.4 MG/5ML IV SOLN
INTRAVENOUS | Status: AC
  Administered 2021-03-16: 0.4 mg via INTRAVENOUS
  Filled 2021-03-16: qty 5

## 2021-03-18 ENCOUNTER — Telehealth: Payer: Self-pay

## 2021-03-18 NOTE — Telephone Encounter (Signed)
-----   Message from Creig Hines, NP sent at 03/16/2021  6:36 PM EDT ----- Normal stress test.  Very reassuring.

## 2021-03-18 NOTE — Telephone Encounter (Signed)
Patient notified and voiced understanding.

## 2021-03-22 ENCOUNTER — Telehealth: Payer: Self-pay | Admitting: *Deleted

## 2021-03-22 NOTE — Telephone Encounter (Signed)
-----   Message from Eustace Moore, RN sent at 03/22/2021  7:18 AM EDT -----  ----- Message ----- From: Netta Neat., NP Sent: 03/21/2021   3:35 PM EDT To: Lesle Chris, LPN  Please call the patient and let them know the cardiac monitor showed she had heart rate ranging from a low of 58 up to 158. The average heart rate was 95. She had some rare premature beats. She has three short episodes of a rapid heartbeat. The longest lasting of those episodes was 12 seconds long. The rhythm was a benign rhythm. No sustained arrhythmias were noted. Thank you.   Netta Neat, NP  03/21/2021 3:29 PM

## 2021-03-22 NOTE — Telephone Encounter (Signed)
Left message for patient to call office.  

## 2021-03-26 NOTE — Telephone Encounter (Signed)
Patient informed. Copy sent to PCP °

## 2021-04-01 ENCOUNTER — Ambulatory Visit: Payer: 59 | Admitting: Internal Medicine

## 2021-04-02 ENCOUNTER — Encounter: Payer: Self-pay | Admitting: Internal Medicine

## 2021-04-08 ENCOUNTER — Other Ambulatory Visit: Payer: 59

## 2021-04-09 DIAGNOSIS — E7800 Pure hypercholesterolemia, unspecified: Secondary | ICD-10-CM | POA: Diagnosis not present

## 2021-04-09 DIAGNOSIS — I498 Other specified cardiac arrhythmias: Secondary | ICD-10-CM | POA: Diagnosis not present

## 2021-04-09 DIAGNOSIS — Z8241 Family history of sudden cardiac death: Secondary | ICD-10-CM | POA: Diagnosis not present

## 2021-04-09 DIAGNOSIS — R079 Chest pain, unspecified: Secondary | ICD-10-CM | POA: Diagnosis not present

## 2021-04-09 DIAGNOSIS — R002 Palpitations: Secondary | ICD-10-CM | POA: Diagnosis not present

## 2021-04-21 ENCOUNTER — Other Ambulatory Visit: Payer: Self-pay

## 2021-04-21 ENCOUNTER — Ambulatory Visit (HOSPITAL_COMMUNITY)
Admission: RE | Admit: 2021-04-21 | Discharge: 2021-04-21 | Disposition: A | Discharge status: Home / Self Care | Payer: Medicare Other | Source: Ambulatory Visit | Attending: Family Medicine | Admitting: Family Medicine

## 2021-04-21 DIAGNOSIS — R079 Chest pain, unspecified: Secondary | ICD-10-CM | POA: Diagnosis not present

## 2021-04-21 DIAGNOSIS — R0602 Shortness of breath: Secondary | ICD-10-CM

## 2021-04-21 LAB — ECHOCARDIOGRAM COMPLETE
AR max vel: 2.37 cm2
AV Area VTI: 2.74 cm2
AV Area mean vel: 2.25 cm2
AV Mean grad: 4.7 mmHg
AV Peak grad: 8.5 mmHg
Ao pk vel: 1.46 m/s
Area-P 1/2: 2.67 cm2
S' Lateral: 3.7 cm

## 2021-04-21 NOTE — Progress Notes (Signed)
*  PRELIMINARY RESULTS* Echocardiogram 2D Echocardiogram has been performed.  Stacey Drain 04/21/2021, 1:41 PM

## 2021-04-26 ENCOUNTER — Telehealth: Payer: Self-pay | Admitting: *Deleted

## 2021-04-26 NOTE — Telephone Encounter (Signed)
-----   Message from Eustace Moore, RN sent at 04/26/2021  7:37 AM EDT -----  ----- Message ----- From: Netta Neat., NP Sent: 04/25/2021   7:25 PM EDT To: Lesle Chris, LPN  Please call the patient and let her know the echocardiogram showed she has good pumping function of her heart.  The main pumping chamber is a little stiff and has problems relaxing.  No leaking valves noted.   Netta Neat, NP  04/25/2021 7:25 PM

## 2021-05-03 ENCOUNTER — Emergency Department (HOSPITAL_COMMUNITY): Payer: Medicare Other

## 2021-05-03 ENCOUNTER — Ambulatory Visit
Admission: EM | Admit: 2021-05-03 | Discharge: 2021-05-03 | Disposition: A | Discharge status: Home / Self Care | Payer: Medicare Other | Attending: Family Medicine | Admitting: Family Medicine

## 2021-05-03 ENCOUNTER — Emergency Department (HOSPITAL_COMMUNITY)
Admission: EM | Admit: 2021-05-03 | Discharge: 2021-05-03 | Disposition: A | Discharge status: Home / Self Care | Payer: Medicare Other | Attending: Emergency Medicine | Admitting: Emergency Medicine

## 2021-05-03 ENCOUNTER — Encounter (HOSPITAL_COMMUNITY): Payer: Self-pay

## 2021-05-03 ENCOUNTER — Encounter: Payer: Self-pay | Admitting: Emergency Medicine

## 2021-05-03 ENCOUNTER — Other Ambulatory Visit: Payer: Self-pay

## 2021-05-03 DIAGNOSIS — Z743 Need for continuous supervision: Secondary | ICD-10-CM | POA: Diagnosis not present

## 2021-05-03 DIAGNOSIS — R0789 Other chest pain: Secondary | ICD-10-CM | POA: Diagnosis not present

## 2021-05-03 DIAGNOSIS — L089 Local infection of the skin and subcutaneous tissue, unspecified: Secondary | ICD-10-CM

## 2021-05-03 DIAGNOSIS — R000 Tachycardia, unspecified: Secondary | ICD-10-CM | POA: Insufficient documentation

## 2021-05-03 DIAGNOSIS — Z20822 Contact with and (suspected) exposure to covid-19: Secondary | ICD-10-CM

## 2021-05-03 DIAGNOSIS — I10 Essential (primary) hypertension: Secondary | ICD-10-CM | POA: Diagnosis not present

## 2021-05-03 DIAGNOSIS — Z96659 Presence of unspecified artificial knee joint: Secondary | ICD-10-CM | POA: Diagnosis not present

## 2021-05-03 DIAGNOSIS — J069 Acute upper respiratory infection, unspecified: Secondary | ICD-10-CM

## 2021-05-03 DIAGNOSIS — R9431 Abnormal electrocardiogram [ECG] [EKG]: Secondary | ICD-10-CM

## 2021-05-03 DIAGNOSIS — Z87891 Personal history of nicotine dependence: Secondary | ICD-10-CM | POA: Diagnosis not present

## 2021-05-03 DIAGNOSIS — Z7689 Persons encountering health services in other specified circumstances: Secondary | ICD-10-CM | POA: Diagnosis not present

## 2021-05-03 DIAGNOSIS — E860 Dehydration: Secondary | ICD-10-CM | POA: Diagnosis not present

## 2021-05-03 DIAGNOSIS — T148XXA Other injury of unspecified body region, initial encounter: Secondary | ICD-10-CM

## 2021-05-03 DIAGNOSIS — J45909 Unspecified asthma, uncomplicated: Secondary | ICD-10-CM | POA: Diagnosis not present

## 2021-05-03 DIAGNOSIS — R079 Chest pain, unspecified: Secondary | ICD-10-CM | POA: Diagnosis not present

## 2021-05-03 DIAGNOSIS — R059 Cough, unspecified: Secondary | ICD-10-CM | POA: Diagnosis not present

## 2021-05-03 DIAGNOSIS — I499 Cardiac arrhythmia, unspecified: Secondary | ICD-10-CM | POA: Diagnosis not present

## 2021-05-03 LAB — CBC
HCT: 39.4 % (ref 36.0–46.0)
Hemoglobin: 12.3 g/dL (ref 12.0–15.0)
MCH: 29.1 pg (ref 26.0–34.0)
MCHC: 31.2 g/dL (ref 30.0–36.0)
MCV: 93.1 fL (ref 80.0–100.0)
Platelets: 241 10*3/uL (ref 150–400)
RBC: 4.23 MIL/uL (ref 3.87–5.11)
RDW: 11.9 % (ref 11.5–15.5)
WBC: 6.6 10*3/uL (ref 4.0–10.5)
nRBC: 0 % (ref 0.0–0.2)

## 2021-05-03 LAB — POCT FASTING CBG KUC MANUAL ENTRY: POCT Glucose (KUC): 170 mg/dL — AB (ref 70–99)

## 2021-05-03 LAB — BASIC METABOLIC PANEL
Anion gap: 7 (ref 5–15)
BUN: 16 mg/dL (ref 8–23)
CO2: 25 mmol/L (ref 22–32)
Calcium: 8.9 mg/dL (ref 8.9–10.3)
Chloride: 109 mmol/L (ref 98–111)
Creatinine, Ser: 0.74 mg/dL (ref 0.44–1.00)
GFR, Estimated: >60 mL/min (ref >60)
Glucose, Bld: 119 mg/dL — ABNORMAL HIGH (ref 70–99)
Potassium: 3.4 mmol/L — ABNORMAL LOW (ref 3.5–5.1)
Sodium: 141 mmol/L (ref 135–145)

## 2021-05-03 LAB — RESP PANEL BY RT-PCR (FLU A&B, COVID) ARPGX2
Influenza A by PCR: NEGATIVE
Influenza B by PCR: NEGATIVE
SARS Coronavirus 2 by RT PCR: NEGATIVE

## 2021-05-03 LAB — TROPONIN I (HIGH SENSITIVITY)
Troponin I (High Sensitivity): 2 ng/L (ref <18)
Troponin I (High Sensitivity): 2 ng/L (ref <18)

## 2021-05-03 LAB — CBG MONITORING, ED: Glucose-Capillary: 95 mg/dL (ref 70–99)

## 2021-05-03 LAB — D-DIMER, QUANTITATIVE: D-Dimer, Quant: 0.27 ug/mL-FEU (ref 0.00–0.50)

## 2021-05-03 MED ORDER — DOXYCYCLINE HYCLATE 100 MG PO CAPS: 100.0000 mg | ORAL_CAPSULE | Freq: Two times a day (BID) | ORAL | 0 refills | Status: DC

## 2021-05-03 MED ORDER — MUCINEX MAXIMUM STRENGTH 1200 MG PO TB12: 1.0000 | ORAL_TABLET | Freq: Two times a day (BID) | ORAL | 1 refills | Status: DC | PRN

## 2021-05-03 MED ORDER — BENZONATATE 100 MG PO CAPS: 100.0000 mg | ORAL_CAPSULE | Freq: Three times a day (TID) | ORAL | 0 refills | Status: DC

## 2021-05-03 MED ORDER — ASPIRIN 81 MG PO CHEW
324.0000 mg | CHEWABLE_TABLET | Freq: Once | ORAL | Status: AC
  Administered 2021-05-03: 324 mg via ORAL
  Filled 2021-05-03: qty 4

## 2021-05-03 NOTE — ED Provider Notes (Signed)
[facility] Provider Note   CSN: [id number] Arrival date & time: 05/03/21  1840     History Chief Complaint  Patient presents with   Abnormal EKG    Docia ChuckCarol G Schermerhorn is a 65 y.o. female.  HPI  Patient presented to the ED for evaluation of an abnormal EKG.  Patient states she has been having issues with cough, congestion, headaches and loose stools.  She was concerned about the possibility of COVID.  She went to the urgent care for evaluation.  While she was there she was noted to be tachycardic.  Patient also mentioned that she been having intermittent episodes of chest discomfort may be lasting a minute or so over the last several days.  Nothing seemed to acutely trigger it.  She was not having diaphoresis or nausea.  No radiation of the pain.  No exertional component.  Patient had an EKG that appeared abnormal.  She was sent to the ED for further evaluation.  She is not having any chest pain right now  Past Medical History:  Diagnosis Date   Asthma    Chronic pain syndrome    Depression    Diabetes mellitus    GERD (gastroesophageal reflux disease)    Hyperlipidemia, mixed    Migraines    Narcolepsy    Osteoarthritis    Overweight(278.02)    Obesity   Seizures (HCC)    Sleep apnea    Unspecified essential hypertension     Patient Active Problem List   Diagnosis Date Noted   HYPERLIPIDEMIA-MIXED 06/02/2009   OVERWEIGHT/OBESITY 06/02/2009   HYPERTENSION, UNSPECIFIED 06/02/2009    Past Surgical History:  Procedure Laterality Date   KNEE ARTHROSCOPY     MASTECTOMY     Right   PILONIDAL CYST EXCISION     TOTAL KNEE ARTHROPLASTY       OB History   No obstetric history on file.     Family History  Problem Relation Age of Onset   Coronary artery disease Other     Social History   Tobacco Use   Smoking status: Former    Types: Cigarettes    Quit date: 09/26/1988    Years since quitting: 32.6   Smokeless tobacco: Never  Substance Use  Topics   Alcohol use: No   Drug use: Never    Home Medications Prior to Admission medications   Medication Sig Start Date End Date Taking? Authorizing Provider  albuterol (VENTOLIN HFA) 108 (90 Base) MCG/ACT inhaler Inhale 1-2 puffs into the lungs as needed for wheezing or shortness of breath.   Yes [provider]  atorvastatin (LIPITOR) 80 MG tablet Take 80 mg by mouth daily.   Yes [provider]  benzonatate (TESSALON) 100 MG capsule Take 1 capsule (100 mg total) by mouth every 8 (eight) hours. 05/03/21  Yes Linwood DibblesKnapp, Masiya Claassen, MD  busPIRone (BUSPAR) 10 MG tablet Take 10 mg by mouth 2 (two) times daily.   Yes [provider]  cyclobenzaprine (FLEXERIL) 10 MG tablet Take 10 mg by mouth 3 (three) times daily. 04/19/21  Yes [provider]  diclofenac sodium (VOLTAREN) 1 % GEL Apply 2 g topically 4 (four) times daily as needed (pain).   Yes [provider]  Dulaglutide 0.75 MG/0.5ML SOPN Inject into the skin. 0.75mg  every Wednesday.   Yes [provider]  gabapentin (NEURONTIN) 800 MG tablet Take 800 mg by mouth 2 (two) times daily.    Yes [provider]  HYDROcodone-acetaminophen (NORCO/VICODIN) 5-325  MG tablet Take 1 tablet by mouth daily as needed for moderate pain.   Yes [provider]  hydroxypropyl methylcellulose / hypromellose (ISOPTO TEARS / GONIOVISC) 2.5 % ophthalmic solution Place 1 drop into both eyes 3 (three) times daily as needed for dry eyes.   Yes [provider]  hydrOXYzine (ATARAX/VISTARIL) 25 MG tablet Take 25 mg by mouth 3 (three) times daily as needed for anxiety.   Yes [provider]  ibuprofen (ADVIL,MOTRIN) 800 MG tablet Take 800 mg by mouth every 8 (eight) hours as needed for moderate pain.   Yes [provider]  levETIRAcetam (KEPPRA) 1000 MG tablet Take 1,000 mg by mouth 2 (two) times daily.   Yes [provider]  metFORMIN (GLUCOPHAGE-XR) 500 MG 24 hr tablet Take  500 mg by mouth daily.    Yes [provider]  midodrine (PROAMATINE) 10 MG tablet Take 10 mg by mouth 3 (three) times daily. 03/03/21  Yes [provider]  modafinil (PROVIGIL) 200 MG tablet Take 400 mg by mouth every morning.    Yes [provider]  nabumetone (RELAFEN) 750 MG tablet Take 750 mg by mouth 2 (two) times daily. 04/12/21  Yes [provider]  nortriptyline (PAMELOR) 25 MG capsule Take 25 mg by mouth at bedtime.   Yes [provider]  omeprazole (PRILOSEC) 40 MG capsule Take 40 mg by mouth daily. 04/07/21  Yes [provider]  oxybutynin (DITROPAN) 5 MG tablet Take 5 mg by mouth 3 (three) times daily.   Yes [provider]  SUMAtriptan (IMITREX) 50 MG tablet Take 50-100 mg by mouth every 2 (two) hours as needed for migraine. May repeat in 2 hours if headache persists or recurs.   Yes [provider]  topiramate (TOPAMAX) 100 MG tablet Take 100 mg by mouth 2 (two) times daily.   Yes [provider]  venlafaxine XR (EFFEXOR-XR) 150 MG 24 hr capsule Take 300 mg by mouth every evening.   Yes [provider]  zolpidem (AMBIEN) 5 MG tablet Take 5 mg by mouth at bedtime as needed for sleep. 04/19/21  Yes [provider]  doxycycline (VIBRAMYCIN) 100 MG capsule Take 1 capsule (100 mg total) by mouth 2 (two) times daily. Patient not taking: No sig reported 05/03/21   Bing Neighbors, FNP  Guaifenesin [facility] MAXIMUM STRENGTH) 1200 MG TB12 Take 1 tablet (1,200 mg total) by mouth every 12 (twelve) hours as needed. Patient not taking: Reported on 05/03/2021 05/03/21   Bing Neighbors, FNP  omeprazole (PRILOSEC) 20 MG capsule Take 20 mg by mouth daily. Patient not taking: Reported on 05/03/2021    [provider]    Allergies    Azithromycin, Other, Tape, Erythromycin, Hydromorphone hcl, Lyrica [pregabalin], Penicillins, Reglan [metoclopramide], and Sulfonamide derivatives  Review of Systems    Review of Systems  All other systems reviewed and are negative.  Physical Exam Updated Vital Signs BP (!) 151/77   Pulse 87   Temp 98.3 F (36.8 C) (Oral)   Resp (!) 25   Ht 1.676 m (5\' 6" )   Wt 92.5 kg   SpO2 100%   BMI 32.93 kg/m   Physical Exam Vitals and nursing note reviewed.  Constitutional:      General: She is not in acute distress.    Appearance: She is well-developed.  HENT:     Head: Normocephalic and atraumatic.     Right Ear: External ear normal.     Left Ear: External  ear normal.  Eyes:     General: No scleral icterus.       Right eye: No discharge.        Left eye: No discharge.     Conjunctiva/sclera: Conjunctivae normal.  Neck:     Trachea: No tracheal deviation.  Cardiovascular:     Rate and Rhythm: Normal rate and regular rhythm.  Pulmonary:     Effort: Pulmonary effort is normal. No respiratory distress.     Breath sounds: Normal breath sounds. No stridor. No wheezing or rales.  Abdominal:     General: Bowel sounds are normal. There is no distension.     Palpations: Abdomen is soft.     Tenderness: There is no abdominal tenderness. There is no guarding or rebound.  Musculoskeletal:        General: No tenderness or deformity.     Cervical back: Neck supple.  Skin:    General: Skin is warm and dry.     Findings: No rash.  Neurological:     General: No focal deficit present.     Mental Status: She is alert.     Cranial Nerves: No cranial nerve deficit (no facial droop, extraocular movements intact, no slurred speech).     Sensory: No sensory deficit.     Motor: No abnormal muscle tone or seizure activity.     Coordination: Coordination normal.  Psychiatric:        Mood and Affect: Mood normal.    ED Results / Procedures / Treatments   Labs (all labs ordered are listed, but only abnormal results are displayed) Labs Reviewed  BASIC METABOLIC PANEL - Abnormal; Notable for the following components:      Result Value   Potassium 3.4 (*)     Glucose, Bld 119 (*)    All other components within normal limits  RESP PANEL BY RT-PCR (FLU A&B, COVID) ARPGX2  CBC  D-DIMER, QUANTITATIVE  CBG MONITORING, ED  TROPONIN I (HIGH SENSITIVITY)  TROPONIN I (HIGH SENSITIVITY)    EKG None  Radiology DG Chest Portable 1 View  Result Date: 05/03/2021 CLINICAL DATA:  Chest pain and cough EXAM: PORTABLE CHEST 1 VIEW COMPARISON:  02/05/2021 FINDINGS: The heart size and mediastinal contours are within normal limits. Both lungs are clear. The visualized skeletal structures are unremarkable. IMPRESSION: No active disease. Electronically Signed   By: Alcide Clever M.D.   On: 05/03/2021 20:05    Procedures Procedures   Medications Ordered in ED Medications  aspirin chewable tablet 324 mg (324 mg Oral Given 05/03/21 1948)    ED Course  I have reviewed the triage vital signs and the nursing notes.  Pertinent labs & imaging results that were available during my care of the patient were reviewed by me and considered in my medical decision making (see chart for details).  Clinical Course as of 05/03/21 2259  Mon May 03, 2021  2147 Initial troponin normal.  D-dimer normal [JK]  2147 Chest x-ray without acute findings [JK]  2242 Serial troponins are normal [JK]    Clinical Course User Index [JK] Linwood Dibbles, MD   MDM Rules/Calculators/A&P                           Patient presented to the emergency room for evaluation of abnormal EKG.  Patient has had URI type symptoms.  However she has had some intermittent chest discomfort.  Symptoms are not suggestive of cardiac etiology.  EKG  is not showing signs of acute ischemia.  EKG is not normal but her serial troponins are normal and I doubt that her symptoms are related to a cardiac etiology.  COVID and flu are negative.  Most likely viral illness.  Recommend outpatient follow-up with PCP and possibly cardiology. Final Clinical Impression(s) / ED Diagnoses Final diagnoses:  Upper respiratory tract  infection, unspecified type  Abnormal EKG    Rx / DC Orders ED Discharge Orders          Ordered    benzonatate (TESSALON) 100 MG capsule  Every 8 hours        05/03/21 2254             Linwood Dibbles, MD 05/03/21 2301

## 2021-05-03 NOTE — ED Provider Notes (Signed)
[facility]    CSN: [id number] Arrival date & time: 05/03/21  1619      History   Chief Complaint No chief complaint on file.   HPI Cindy Norris is a 65 y.o. female.   HPI Patient with a history of diabetes, asthma and GERD presents today for evaluation of cough and congestion times a few days.  On arrival patient's heart rate is elevated  at 119.  At rest recheck heart rate is still tachycardic at 116 and 117. Patient denies any chest pain or worsening shortness of breath.  She has shortness of breath at baseline. She endorses not drinking large volumes of fluids.  She also endorses that she had a recent exposure to COVID. Although is uncertain if this was source of her current symptoms.  Endorses some fatigue.  Tolerating food and drink normally.  Incidental leg patient has multiple open wounds on her arms.  She reports these wounds have been present from prior injuries and just have not healed.  She is a diabetic denies any visual changes.  Provider returned to room following ECG, patient later admits to experiencing intermittent chest pain left lower chest and mid -sternal chest occurring intermittent over several day. She is followed by cardiology. Recent echo cardiogram negative for work-up.  Past Medical History:  Diagnosis Date   Asthma    Chronic pain syndrome    Depression    Diabetes mellitus    GERD (gastroesophageal reflux disease)    Hyperlipidemia, mixed    Migraines    Narcolepsy    Osteoarthritis    Overweight(278.02)    Obesity   Seizures (HCC)    Sleep apnea    Unspecified essential hypertension     Patient Active Problem List   Diagnosis Date Noted   HYPERLIPIDEMIA-MIXED 06/02/2009   OVERWEIGHT/OBESITY 06/02/2009   HYPERTENSION, UNSPECIFIED 06/02/2009    Past Surgical History:  Procedure Laterality Date   KNEE ARTHROSCOPY     MASTECTOMY     Right   PILONIDAL CYST EXCISION     TOTAL KNEE ARTHROPLASTY      OB History   No  obstetric history on file.      Home Medications    Prior to Admission medications   Medication Sig Start Date End Date Taking? Authorizing Provider  doxycycline (VIBRAMYCIN) 100 MG capsule Take 1 capsule (100 mg total) by mouth 2 (two) times daily. 05/03/21  Yes Bing Neighbors, FNP  Guaifenesin [facility] MAXIMUM STRENGTH) 1200 MG TB12 Take 1 tablet (1,200 mg total) by mouth every 12 (twelve) hours as needed. 05/03/21  Yes Bing Neighbors, FNP  albuterol (VENTOLIN HFA) 108 (90 Base) MCG/ACT inhaler Inhale 1-2 puffs into the lungs as needed for wheezing or shortness of breath.    [provider]  atorvastatin (LIPITOR) 80 MG tablet Take 80 mg by mouth daily.    [provider]  busPIRone (BUSPAR) 10 MG tablet Take 10 mg by mouth 2 (two) times daily.    [provider]  diclofenac sodium (VOLTAREN) 1 % GEL Apply 2 g topically 4 (four) times daily as needed (pain).    [provider]  Dulaglutide 0.75 MG/0.5ML SOPN Inject into the skin. 0.75mg  every Wednesday.    [provider]  gabapentin (NEURONTIN) 800 MG tablet Take 800 mg by mouth 2 (two) times daily.     [provider]  HYDROcodone-acetaminophen (NORCO/VICODIN) 5-325 MG tablet Take 1 tablet by mouth daily as needed for moderate pain.  [provider]  hydroxypropyl methylcellulose / hypromellose (ISOPTO TEARS / GONIOVISC) 2.5 % ophthalmic solution Place 1 drop into both eyes 3 (three) times daily as needed for dry eyes.    [provider]  hydrOXYzine (ATARAX/VISTARIL) 25 MG tablet Take 25 mg by mouth 3 (three) times daily as needed for anxiety.    [provider]  ibuprofen (ADVIL,MOTRIN) 800 MG tablet Take 800 mg by mouth every 8 (eight) hours as needed for moderate pain.    [provider]  levETIRAcetam (KEPPRA) 1000 MG tablet Take 1,000 mg by mouth 2 (two) times daily.    [provider]  metFORMIN (GLUCOPHAGE-XR) 500 MG 24 hr tablet  Take 500 mg by mouth daily.     [provider]  modafinil (PROVIGIL) 200 MG tablet Take 400 mg by mouth every morning.     [provider]  nortriptyline (PAMELOR) 25 MG capsule Take 25 mg by mouth at bedtime.    [provider]  omeprazole (PRILOSEC) 20 MG capsule Take 20 mg by mouth daily.    [provider]  oxybutynin (DITROPAN) 5 MG tablet Take 5 mg by mouth 3 (three) times daily.    [provider]  SUMAtriptan (IMITREX) 50 MG tablet Take 50-100 mg by mouth every 2 (two) hours as needed for migraine. May repeat in 2 hours if headache persists or recurs.    [provider]  topiramate (TOPAMAX) 100 MG tablet Take 100 mg by mouth 2 (two) times daily.    [provider]  venlafaxine XR (EFFEXOR-XR) 150 MG 24 hr capsule Take 150 mg by mouth 2 (two) times daily.    [provider]    Family History Family History  Problem Relation Age of Onset   Coronary artery disease Other     Social History Social History   Tobacco Use   Smoking status: Former    Types: Cigarettes    Quit date: 09/26/1988    Years since quitting: 32.6   Smokeless tobacco: Never  Substance Use Topics   Alcohol use: No   Drug use: Never     Allergies   Azithromycin, Other, Tape, Erythromycin, Hydromorphone hcl, Lyrica [pregabalin], Penicillins, Reglan [metoclopramide], and Sulfonamide derivatives   Review of Systems Review of Systems Pertinent negatives listed in HPI   Physical Exam Triage Vital Signs ED Triage Vitals  Enc Vitals Group     BP 05/03/21 1701 124/80     Pulse Rate 05/03/21 1701 (!) 119     Resp 05/03/21 1701 16     Temp 05/03/21 1701 98.1 F (36.7 C)     Temp Source 05/03/21 1701 Oral     SpO2 05/03/21 1701 96 %     Weight --      Height --      Head Circumference --      Peak Flow --      Pain Score 05/03/21 1702 0     Pain Loc --      Pain Edu? --      Excl. in GC? --    No data found.  Updated Vital  Signs BP 124/80 (BP Location: Right Arm)   Pulse (!) 119   Temp 98.1 F (36.7 C) (Oral)   Resp 16   SpO2 96%   Visual Acuity Right Eye Distance:   Left Eye Distance:   Bilateral Distance:    Right Eye Near:   Left Eye Near:    Bilateral Near:  Physical Exam Constitutional:      Appearance: She is ill-appearing.  HENT:     Head: Normocephalic.     Nose: Congestion and rhinorrhea present.  Eyes:     Pupils: Pupils are equal, round, and reactive to light.  Cardiovascular:     Rate and Rhythm: Tachycardia present. Rhythm irregular.  Pulmonary:     Effort: Pulmonary effort is normal.     Breath sounds: Decreased breath sounds present. No wheezing or rhonchi.  Skin:    General: Skin is warm.     Capillary Refill: Capillary refill takes less than 2 seconds.  Neurological:     Mental Status: She is alert and oriented to person, place, and time.     GCS: GCS eye subscore is 4. GCS verbal subscore is 5. GCS motor subscore is 6.     Coordination: Coordination is intact.     UC Treatments / Results  Labs (all labs ordered are listed, but only abnormal results are displayed) Labs Reviewed  POCT FASTING CBG [facility] MANUAL ENTRY - Abnormal; Notable for the following components:      Result Value   POCT Glucose ([facility]) 170 (*)    All other components within normal limits  COVID-19, FLU A+B NAA    EKG   Radiology No results found.  Procedures Procedures (including critical care time)  Medications Ordered in UC Medications - No data to display  Initial Impression / Assessment and Plan / UC Course  I have reviewed the triage vital signs and the nursing notes.  Pertinent labs & imaging results that were available during my care of the patient were reviewed by me and considered in my medical decision making (see chart for details).    EKG abnormal showing acute ST changes and tachycardia. Given chest pain, changes of ECG compared to prior recent tracing patient is being  deferred to the ER for further work-up. Family refused to return to clinic to pick patient up after dropping her off, EMS called for transport.  Final Clinical Impressions(s) / UC Diagnoses   Final diagnoses:  Wound infection  Viral respiratory illness  Mild dehydration  Exposure to COVID-19 virus  Tachycardia  EKG abnormalities  Chest pain, unspecified type, several days      Discharge Instructions      EKG abnormal and with chest pain, go to [facility]       ED Prescriptions     Medication Sig Dispense Auth. Provider   doxycycline (VIBRAMYCIN) 100 MG capsule Take 1 capsule (100 mg total) by mouth 2 (two) times daily. 20 capsule Bing Neighbors, FNP   Guaifenesin [facility] MAXIMUM STRENGTH) 1200 MG TB12 Take 1 tablet (1,200 mg total) by mouth every 12 (twelve) hours as needed. 14 tablet Bing Neighbors, FNP      PDMP not reviewed this encounter.   Bing Neighbors, FNP 05/03/21 [phone]

## 2021-05-03 NOTE — ED Triage Notes (Signed)
Cough and nasal congestion started yesterday.  Also states she has been having diarrhea x 4 days.  States this may be due to her IBS

## 2021-05-03 NOTE — ED Notes (Signed)
Patient is being discharged from the Urgent Care and sent to the Emergency Department via private vechile . Per Jerrilyn Cairo, patient is in need of higher level of care due to chest pain and rapid heart rate. Patient is aware and verbalizes understanding of plan of care.  Vitals:   05/03/21 1701  BP: 124/80  Pulse: (!) 119  Resp: 16  Temp: 98.1 F (36.7 C)  SpO2: 96%

## 2021-05-03 NOTE — ED Notes (Signed)
Patient is resting comfortably. 

## 2021-05-03 NOTE — ED Notes (Signed)
Patient transported to X-ray 

## 2021-05-03 NOTE — ED Notes (Signed)
EMS here to transport patient

## 2021-05-03 NOTE — ED Triage Notes (Signed)
Pt arrives via [facility] from urgent care with c/o abnormal EKG changes. She went in to be seen for a cough, was tachycardic there and EKG preformed that showed T-wave abnormalities. Pt states she has had intermittent chest pain over the last 2-3 days, but none currently.

## 2021-05-03 NOTE — Discharge Instructions (Addendum)
EKG abnormal and with chest pain, go to [facility].

## 2021-05-03 NOTE — ED Notes (Signed)
Report given to Fransico Him, RN at [facility] ED.

## 2021-05-03 NOTE — Discharge Instructions (Addendum)
The test today were reassuring.  No signs of heart attack.  No signs of blood clots.  Your COVID and flu test were negative.  Consider following up with a cardiologist for further outpatient evaluation

## 2021-05-03 NOTE — ED Notes (Signed)
Unable to get transportation from family to the ED.  EMS called for transport.

## 2021-05-04 ENCOUNTER — Encounter: Payer: Self-pay | Admitting: *Deleted

## 2021-05-04 LAB — COVID-19, FLU A+B NAA
Influenza A, NAA: NOT DETECTED
Influenza B, NAA: NOT DETECTED
SARS-CoV-2, NAA: NOT DETECTED

## 2021-05-04 NOTE — Telephone Encounter (Signed)
Letter mailed with results Copy sent to PCP

## 2021-05-14 ENCOUNTER — Ambulatory Visit: Payer: Medicare Other | Admitting: Cardiovascular Disease

## 2021-05-17 NOTE — Progress Notes (Addendum)
Cardiology Office Note  Date: 05/18/2021   ID: Cindy, Norris [birth year], MRN [id number]  PCP:  [facility]  Cardiologist:  None Electrophysiologist:  None   Chief Complaint: Follow-up cardiac monitor, stress test, echocardiogram, ED follow-up.  History of Present Illness: Cindy Norris is a 65 y.o. female with a history of hyperlipidemia, obesity, seizure disorder, sleep apnea, GERD, chronic pain syndrome, palpitations.  She was last seen by Dr. Purvis Sheffield 01/21/2019 for palpitations.  She had her previous office visit and March 2020 with complaint of dizziness and problems with balance.  Blood pressure was 96/58 while sitting.  Her palpitations had improved.  She denied any chest pain.  She continued with chronic exertional dyspnea and fatigue.  She had a diagnosis of sleep apnea but was no longer using CPAP machine because it had stopped working.  Since palpitations and dizziness had subsided she was instructed to call the office should symptoms recur and would possibly explore cardiac testing.  He stated CPAP machine was broken and had not been using it.  She had been feeling fatigued to CPAP had stopped.  Recent presentation to [facility] on 02/06/2020 he was complaining of chest pain.  Pain began while at rest sitting on her porch.  Troponins were 6, 7, and 1, CBC was unremarkable proBNP was 41, magnesium 1.8, lipase 138, potassium 3.4, alkaline phosphatase 139.  Chest x-ray: Small granuloma in the right lower lobe, lungs elsewhere were clear, heart size and pulmonary vascularity were normal.  She is here for follow-up status post recent hospital stay.  She continues to complain of abdominal pain/epigastric pain/chest pain.  She states the pain comes and goes rated at 7 out of 10 when it occurs states it is short lasting without radiation to neck, arm, back or jaw.  States she does have some occasional nausea usually when it occurs.  Also complaining of some  increased DOE recently.  States she has been having some palpitations recently and and becomes dizzy when this occurs.  She states she has IBS on the continuum associated with diarrhea mostly.  She states she is never seen a gastroenterologist.  Blood pressure is well controlled at 132/62.  States she no longer uses CPAP.  She was recently seen at [facility] emergency department for evaluation of abnormal EKG.  She she had been having issues with cough and congestion with headaches and loose stool.  She went to urgent care for evaluation.  She was noted to be tachycardiac and she has been having intermittent episodes of chest discomfort.  She was sent to ED for further evaluation.  She was not having any chest pain at time of exam.  Initial troponin was normal and D-dimer was normal.  Chest x-ray was negative.  Serial troponins were normal..  No acute changes in EKG.  COVID test was negative.  ED recommended outpatient follow-up with PCP and possibly cardiology.  She presents today for follow-up of recent ZIO monitor and stress test results as well as follow-up from recent ED visit for abnormal EKG.  She had tachycardia on her EKG it urgent care and the emergency room with some ST and T wave abnormalities.  She was ruled out for ACS in the emergency room.  EKG today shows normal sinus rhythm with a rate of 77 and nonspecific T wave abnormality.  She currently denies any palpitations or arrhythmias.  Recent stress test was negative for ischemia and considered low risk.  Echocardiogram demonstrated  EF of 55 to 60%.  No WMA's.  G1 DD,.  14-day ZIO monitor showed no significant arrhythmias.  She did have rare PACs and rare PVCs.  She had 3 episodes of SVT.  The longest lasted for approximately 12 seconds with average heart rate of 148.  No pauses.  She states she still has some left inframammary pain which has no particular pattern and is not necessarily associated with activity.  States that it can occur randomly.   Denies any radiation or associated symptoms.  Denies any current DOE or SOB.  Denies any current palpitations or arrhythmias.  Denies any CVA or TIA-like symptoms, orthostatic symptoms, PND, orthopnea, bleeding, DVT or PE-like symptoms, or lower extremity edema.  States she does have GERD and takes PPI omeprazole.  . Past Medical History:  Diagnosis Date   Asthma    Chronic pain syndrome    Depression    Diabetes mellitus    GERD (gastroesophageal reflux disease)    Hyperlipidemia, mixed    Migraines    Narcolepsy    Osteoarthritis    Overweight(278.02)    Obesity   Seizures (HCC)    Sleep apnea    Unspecified essential hypertension     Past Surgical History:  Procedure Laterality Date   KNEE ARTHROSCOPY     MASTECTOMY     Right   PILONIDAL CYST EXCISION     TOTAL KNEE ARTHROPLASTY      Current Outpatient Medications  Medication Sig Dispense Refill   albuterol (VENTOLIN HFA) 108 (90 Base) MCG/ACT inhaler Inhale 1-2 puffs into the lungs as needed for wheezing or shortness of breath.     atorvastatin (LIPITOR) 80 MG tablet Take 80 mg by mouth daily.     benzonatate (TESSALON) 100 MG capsule Take 1 capsule (100 mg total) by mouth every 8 (eight) hours. 21 capsule 0   busPIRone (BUSPAR) 10 MG tablet Take 10 mg by mouth 2 (two) times daily.     cyclobenzaprine (FLEXERIL) 10 MG tablet Take 10 mg by mouth 3 (three) times daily.     diclofenac sodium (VOLTAREN) 1 % GEL Apply 2 g topically 4 (four) times daily as needed (pain).     Dulaglutide 0.75 MG/0.5ML SOPN Inject into the skin. 0.75mg  every Wednesday.     gabapentin (NEURONTIN) 800 MG tablet Take 800 mg by mouth 2 (two) times daily.      HYDROcodone-acetaminophen (NORCO/VICODIN) 5-325 MG tablet Take 1 tablet by mouth daily as needed for moderate pain.     hydroxypropyl methylcellulose / hypromellose (ISOPTO TEARS / GONIOVISC) 2.5 % ophthalmic solution Place 1 drop into both eyes 3 (three) times daily as needed for dry eyes.      hydrOXYzine (ATARAX/VISTARIL) 25 MG tablet Take 25 mg by mouth 3 (three) times daily as needed for anxiety.     ibuprofen (ADVIL,MOTRIN) 800 MG tablet Take 800 mg by mouth every 8 (eight) hours as needed for moderate pain.     levETIRAcetam (KEPPRA) 1000 MG tablet Take 1,000 mg by mouth 2 (two) times daily.     metFORMIN (GLUCOPHAGE-XR) 500 MG 24 hr tablet Take 500 mg by mouth daily.      midodrine (PROAMATINE) 10 MG tablet Take 10 mg by mouth 3 (three) times daily.     modafinil (PROVIGIL) 200 MG tablet Take 400 mg by mouth every morning.      nabumetone (RELAFEN) 750 MG tablet Take 750 mg by mouth 2 (two) times daily.     nortriptyline (PAMELOR)  25 MG capsule Take 25 mg by mouth at bedtime.     omeprazole (PRILOSEC) 40 MG capsule Take 40 mg by mouth daily.     oxybutynin (DITROPAN) 5 MG tablet Take 5 mg by mouth 3 (three) times daily.     SUMAtriptan (IMITREX) 50 MG tablet Take 50-100 mg by mouth every 2 (two) hours as needed for migraine. May repeat in 2 hours if headache persists or recurs.     topiramate (TOPAMAX) 100 MG tablet Take 100 mg by mouth 2 (two) times daily.     venlafaxine XR (EFFEXOR-XR) 150 MG 24 hr capsule Take 300 mg by mouth every evening.     zolpidem (AMBIEN) 5 MG tablet Take 5 mg by mouth at bedtime as needed for sleep.     No current facility-administered medications for this visit.   Allergies:  Azithromycin, Other, Tape, Erythromycin, Hydromorphone hcl, Lyrica [pregabalin], Penicillins, Reglan [metoclopramide], and Sulfonamide derivatives   Social History: The patient  reports that she quit smoking about 32 years ago. Her smoking use included cigarettes. She has never used smokeless tobacco. She reports that she does not drink alcohol and does not use drugs.   Family History: The patient's family history includes Coronary artery disease in an other family member.   ROS:  Please see the history of present illness. Otherwise, complete review of systems is positive for  none.  All other systems are reviewed and negative.   Physical Exam: VS:  BP 136/74   Pulse 83   Ht 5\' 6"  (1.676 m)   Wt 209 lb 6.4 oz (95 kg)   SpO2 96%   BMI 33.80 kg/m , BMI Body mass index is 33.8 kg/m.  Wt Readings from Last 3 Encounters:  05/18/21 209 lb 6.4 oz (95 kg)  05/03/21 204 lb (92.5 kg)  02/26/21 205 lb (93 kg)    General: Patient appears comfortable at rest. Neck: Supple, no elevated JVP or carotid bruits, no thyromegaly. Lungs: Clear to auscultation, nonlabored breathing at rest. Cardiac: Regular rate and rhythm, no S3 or significant systolic murmur, no pericardial rub. Extremities: No pitting edema, distal pulses 2+. Skin: Warm and dry. Musculoskeletal: No kyphosis. Neuropsychiatric: Alert and oriented x3, affect grossly appropriate.  ECG: 05/18/2021 EKG normal sinus rhythm rate of 77, nonspecific T wave abnormality.  Recent Labwork: 05/03/2021: BUN 16; Creatinine, Ser 0.74; Hemoglobin 12.3; Platelets 241; Potassium 3.4; Sodium 141  No results found for: CHOL, TRIG, HDL, CHOLHDL, VLDL, LDLCALC, LDLDIRECT  Other Studies Reviewed Today:  14 Zio Monitor 02/26/2021 Study Highlights  ZIO XT reviewed.  13 days, 17 hours analyzed.  Predominant rhythm is sinus with heart rate ranging from 58 bpm up to 152 bpm and average heart rate 95 bpm.  There were rare PACs including couplets and triplets representing less than 1% total beats.  There were rare PVCs including couplets representing less than 1% total beats.  Three episodes of SVT were noted, the longest of which lasted for approximately 12 seconds with average heart rate 148 bpm.  No pauses.    Echocardiogram 04/21/2021  1. Left ventricular ejection fraction, by estimation, is 55 to 60%. The left ventricle has normal function. The left ventricle has no regional wall motion abnormalities. Left ventricular diastolic parameters are consistent with Grade I diastolic dysfunction (impaired relaxation). Elevated left  atrial pressure. 2. Right ventricular systolic function is normal. The right ventricular size is normal. Tricuspid regurgitation signal is inadequate for assessing PA pressure. 3. The mitral valve is normal  in structure. No evidence of mitral valve regurgitation. No evidence of mitral stenosis. 4. The aortic valve is tricuspid. Aortic valve regurgitation is not visualized. No aortic stenosis is present. 5. The inferior vena cava is normal in size with greater than 50% respiratory variability, suggesting right atrial pressure of 3 mmHg.    Nuclear stress test 03/16/2021 Study Result  Narrative & Impression  There was no ST segment deviation noted during stress. The study is normal. There are no perfusion defects consistent with prior infarct or current ischemia. This is a low risk study. The left ventricular ejection fraction is low normal (50%    Assessment and Plan:  1. Palpitations   2. Dizziness   3. OSA (obstructive sleep apnea)   4. Mixed hyperlipidemia   5. Abdominal pain, unspecified abdominal location   6. SOB (shortness of breath)   7. Chest pain, unspecified type     1. Palpitations At last visit she complained of increasing palpitations with associated dizziness recently.  Recent ZIO monitor showed rare PACs and PVCs.  She had 3 episodes of SVT longest lasted 12 seconds.  Otherwise no significant arrhythmias.  Currently denies any palpitations.   2. Dizziness At last visit complained of recent episodes of dizziness associated with palpitations.  Denies any near syncopal or syncopal episodes.  No recent dizziness.  3. OSA (obstructive sleep apnea) Patient states she stopped using her CPAP.  She complains of chronic fatigue.   4. Mixed hyperlipidemia  Continue atorvastatin 80 mg daily.  5.  Abdominal pain/elevated lipase Last visit complained of abdominal pain/epigastric pain with recent hospital visit and elevated lipase.  She states she has a history of IBS  with diarrhea.  She states she has never seen a gastroenterologist.  At last visit she was referred to [facility].  6.  Dyspnea Complaining of increasing dyspnea over the last few months.  Increasing exertional fatigue with dyspnea.  Recent echocardiogram  demonstrated EF of 55 to 60%.  No WMA's.  G1 DD.  Denies any current dyspnea.  7.  Chest pain Recent admission for chest pain/abdominal pain with negative enzymes.  She has multiple risk factors for heart disease including diabetes, hyperlipidemia, age.  Recent Lexiscan stress was low risk.     Medication Adjustments/Labs and Tests Ordered: Current medicines are reviewed at length with the patient today.  Concerns regarding medicines are outlined above.   Disposition: Follow-up with Dr. Wyline Mood or APP 6 months  Signed, Rennis Harding, NP 05/18/2021 11:15 AM    [facility] at [facility] [address] [place], [place], [place] [address] Phone: [phone]; Fax: [phone]

## 2021-05-18 ENCOUNTER — Encounter: Payer: Self-pay | Admitting: Family Medicine

## 2021-05-18 ENCOUNTER — Ambulatory Visit (INDEPENDENT_AMBULATORY_CARE_PROVIDER_SITE_OTHER): Payer: Medicare Other | Admitting: Family Medicine

## 2021-05-18 VITALS — BP 136/74 | HR 83 | Ht 66.0 in | Wt 209.4 lb

## 2021-05-18 DIAGNOSIS — R109 Unspecified abdominal pain: Secondary | ICD-10-CM | POA: Diagnosis not present

## 2021-05-18 DIAGNOSIS — R079 Chest pain, unspecified: Secondary | ICD-10-CM

## 2021-05-18 DIAGNOSIS — R002 Palpitations: Secondary | ICD-10-CM | POA: Diagnosis not present

## 2021-05-18 DIAGNOSIS — G4733 Obstructive sleep apnea (adult) (pediatric): Secondary | ICD-10-CM | POA: Diagnosis not present

## 2021-05-18 DIAGNOSIS — R0602 Shortness of breath: Secondary | ICD-10-CM | POA: Diagnosis not present

## 2021-05-18 DIAGNOSIS — R42 Dizziness and giddiness: Secondary | ICD-10-CM | POA: Diagnosis not present

## 2021-05-18 DIAGNOSIS — E782 Mixed hyperlipidemia: Secondary | ICD-10-CM | POA: Diagnosis not present

## 2021-05-18 NOTE — Addendum Note (Signed)
Addended by: Lesle Chris on: 05/18/2021 04:48 PM   Modules accepted: Orders

## 2021-05-18 NOTE — Patient Instructions (Signed)
Medication Instructions:  Continue all current medications.   Labwork: none  Testing/Procedures: none  Follow-Up: 6 months   Any Other Special Instructions Will Be Listed Below (If Applicable).   If you need a refill on your cardiac medications before your next appointment, please call your pharmacy.  

## 2021-06-02 DIAGNOSIS — G47419 Narcolepsy without cataplexy: Secondary | ICD-10-CM | POA: Diagnosis not present

## 2021-06-02 DIAGNOSIS — M542 Cervicalgia: Secondary | ICD-10-CM | POA: Diagnosis not present

## 2021-06-02 DIAGNOSIS — I951 Orthostatic hypotension: Secondary | ICD-10-CM | POA: Diagnosis not present

## 2021-06-02 DIAGNOSIS — G43701 Chronic migraine without aura, not intractable, with status migrainosus: Secondary | ICD-10-CM | POA: Diagnosis not present

## 2021-06-02 DIAGNOSIS — Z79891 Long term (current) use of opiate analgesic: Secondary | ICD-10-CM | POA: Diagnosis not present

## 2021-06-02 DIAGNOSIS — K219 Gastro-esophageal reflux disease without esophagitis: Secondary | ICD-10-CM | POA: Diagnosis not present

## 2021-06-02 DIAGNOSIS — G894 Chronic pain syndrome: Secondary | ICD-10-CM | POA: Diagnosis not present

## 2021-06-02 DIAGNOSIS — M545 Low back pain, unspecified: Secondary | ICD-10-CM | POA: Diagnosis not present

## 2021-06-02 DIAGNOSIS — R296 Repeated falls: Secondary | ICD-10-CM | POA: Diagnosis not present

## 2021-06-03 ENCOUNTER — Ambulatory Visit: Payer: 59 | Admitting: Family Medicine

## 2021-06-14 DIAGNOSIS — H25813 Combined forms of age-related cataract, bilateral: Secondary | ICD-10-CM | POA: Diagnosis not present

## 2021-06-14 DIAGNOSIS — H01002 Unspecified blepharitis right lower eyelid: Secondary | ICD-10-CM | POA: Diagnosis not present

## 2021-06-14 DIAGNOSIS — H01001 Unspecified blepharitis right upper eyelid: Secondary | ICD-10-CM | POA: Diagnosis not present

## 2021-06-14 DIAGNOSIS — H01004 Unspecified blepharitis left upper eyelid: Secondary | ICD-10-CM | POA: Diagnosis not present

## 2021-06-30 NOTE — H&P (Signed)
Surgical History & Physical  Patient Name: Cindy Norris DOB: Feb 21, 1956  Surgery: Cataract extraction with intraocular lens implant phacoemulsification; Left Eye  Surgeon: Fabio Pierce MD Surgery Date:  07-05-2021 Pre-Op Date:  06-14-2021  HPI: A 48 Yr. old female patient is referred by Dr Daphine Deutscher for cataract eval. 1. 1. The patient complains of difficulty when reading fine print, books, newspaper, instructions etc., which began many years ago. Both eyes are affected. The episode is gradual. The condition's severity increased since last visit. Symptoms occur when the patient is inside and outside. This is negatively affecting the patient's quality of life. HPI Completed by Dr. Fabio Pierce  Medical History: Dry Eyes Cataracts gland dysfunction Anxiety disorder, Bilateral Peripheral neuropathy ... Arthritis Diabetes  Review of Systems Negative Allergic/Immunologic Negative Cardiovascular Negative Constitutional Negative Ear, Nose, Mouth & Throat Negative Endocrine Negative Eyes Negative Gastrointestinal Negative Genitourinary Negative Hemotologic/Lymphatic Negative Integumentary Negative Musculoskeletal Negative Neurological Negative Psychiatry Negative Respiratory  Social   Never smoked   Medication Lipitor, Lisinopril, Metformin, Gabapentin, Keppra,   Sx/Procedures Bilateral replacement of knee joints, Carpal Tunnel, Surgical repair, Lumpectomy of left breast, Cyst removal from back, Rt/ lt elbow, Rt/lt shoulder,   Drug Allergies  Lyrica, Sulfa, Adhesive tape, Dawn detergent with oil of olay,   History & Physical: Heent: Cataract, Left Eye NECK: supple without bruits LUNGS: lungs clear to auscultation CV: regular rate and rhythm Abdomen: soft and non-tender  Impression & Plan: Assessment: 1.  COMBINED FORMS AGE RELATED CATARACT; Both Eyes (H25.813) 2.  ASTIGMATISM, REGULAR; Both Eyes (H52.223) 3.  BLEPHARITIS; Right Upper Lid, Right Lower Lid, Left Upper  Lid, Left Lower Lid (H01.001, H01.002,H01.004,H01.005) 4.  DERMATOCHALASIS; Right Upper Lid, Left Upper Lid (H02.831, M38.466)  Plan: 1.  Cataract accounts for the patient's decreased vision. This visual impairment is not correctable with a tolerable change in glasses or contact lenses. Cataract surgery with an implantation of a new lens should significantly improve the visual and functional status of the patient. Discussed all risks, benefits, alternatives, and potential complications. Discussed the procedures and recovery. Patient desires to have surgery. A-scan ordered and performed today for intra-ocular lens calculations. The surgery will be performed in order to improve vision for driving, reading, and for eye examinations. Recommend phacoemulsification with intra-ocular lens. Recommend Dextenza for post-operative pain and inflammation. Left Eye - worse Dilates poorly - shugacaine by protocol. Malyugin Ring. Omidira. Discussed toric IOL OS only.  2.  Much worse OS. Recommend Toric IOL OS only.  3.  Recommend regular lid cleaning.  4.  Asymptomatic, recommend observation for now. Findings, prognosis and treatment options reviewed.

## 2021-07-01 ENCOUNTER — Encounter (HOSPITAL_COMMUNITY): Payer: Self-pay

## 2021-07-01 ENCOUNTER — Encounter (HOSPITAL_COMMUNITY)
Admission: RE | Admit: 2021-07-01 | Discharge: 2021-07-01 | Disposition: A | Discharge status: Home / Self Care | Payer: Medicare Other | Source: Ambulatory Visit | Attending: Ophthalmology | Admitting: Ophthalmology

## 2021-07-01 ENCOUNTER — Other Ambulatory Visit: Payer: Self-pay

## 2021-07-01 DIAGNOSIS — J0101 Acute recurrent maxillary sinusitis: Secondary | ICD-10-CM | POA: Diagnosis not present

## 2021-07-03 ENCOUNTER — Other Ambulatory Visit: Payer: Self-pay

## 2021-07-03 ENCOUNTER — Emergency Department (HOSPITAL_COMMUNITY)
Admission: EM | Admit: 2021-07-03 | Discharge: 2021-07-03 | Disposition: A | Discharge status: Home / Self Care | Payer: Medicare Other | Attending: Emergency Medicine | Admitting: Emergency Medicine

## 2021-07-03 ENCOUNTER — Emergency Department (HOSPITAL_COMMUNITY): Payer: Medicare Other

## 2021-07-03 ENCOUNTER — Encounter (HOSPITAL_COMMUNITY): Payer: Self-pay | Admitting: *Deleted

## 2021-07-03 DIAGNOSIS — S0990XA Unspecified injury of head, initial encounter: Secondary | ICD-10-CM

## 2021-07-03 DIAGNOSIS — S0001XA Abrasion of scalp, initial encounter: Secondary | ICD-10-CM | POA: Diagnosis not present

## 2021-07-03 DIAGNOSIS — Z96653 Presence of artificial knee joint, bilateral: Secondary | ICD-10-CM | POA: Diagnosis not present

## 2021-07-03 DIAGNOSIS — Z7984 Long term (current) use of oral hypoglycemic drugs: Secondary | ICD-10-CM | POA: Insufficient documentation

## 2021-07-03 DIAGNOSIS — Z87891 Personal history of nicotine dependence: Secondary | ICD-10-CM | POA: Insufficient documentation

## 2021-07-03 DIAGNOSIS — E119 Type 2 diabetes mellitus without complications: Secondary | ICD-10-CM | POA: Diagnosis not present

## 2021-07-03 DIAGNOSIS — S0003XA Contusion of scalp, initial encounter: Secondary | ICD-10-CM | POA: Diagnosis not present

## 2021-07-03 DIAGNOSIS — J45909 Unspecified asthma, uncomplicated: Secondary | ICD-10-CM | POA: Insufficient documentation

## 2021-07-03 DIAGNOSIS — Z743 Need for continuous supervision: Secondary | ICD-10-CM | POA: Diagnosis not present

## 2021-07-03 DIAGNOSIS — R52 Pain, unspecified: Secondary | ICD-10-CM | POA: Diagnosis not present

## 2021-07-03 MED ORDER — FENTANYL CITRATE PF 50 MCG/ML IJ SOSY
50.0000 ug | PREFILLED_SYRINGE | Freq: Once | INTRAMUSCULAR | Status: AC
  Administered 2021-07-03: 50 ug via INTRAMUSCULAR
  Filled 2021-07-03: qty 1

## 2021-07-03 MED ORDER — ONDANSETRON HCL 4 MG PO TABS: 4.0000 mg | ORAL_TABLET | Freq: Four times a day (QID) | ORAL | 0 refills | Status: DC

## 2021-07-03 MED ORDER — MECLIZINE HCL 12.5 MG PO TABS
12.5000 mg | ORAL_TABLET | Freq: Once | ORAL | Status: AC
  Administered 2021-07-03: 12.5 mg via ORAL
  Filled 2021-07-03: qty 1

## 2021-07-03 MED ORDER — ONDANSETRON 4 MG PO TBDP
4.0000 mg | ORAL_TABLET | Freq: Once | ORAL | Status: AC
  Administered 2021-07-03: 4 mg via ORAL
  Filled 2021-07-03: qty 1

## 2021-07-03 MED ORDER — MECLIZINE HCL 25 MG PO TABS: 25.0000 mg | ORAL_TABLET | Freq: Three times a day (TID) | ORAL | 0 refills | Status: DC | PRN

## 2021-07-03 MED ORDER — MECLIZINE HCL 12.5 MG PO TABS: 12.5000 mg | ORAL_TABLET | Freq: Three times a day (TID) | ORAL | 0 refills | Status: AC | PRN
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
## 2021-07-04 ENCOUNTER — Encounter (HOSPITAL_COMMUNITY): Payer: Self-pay | Admitting: Anesthesiology

## 2021-07-05 ENCOUNTER — Encounter (HOSPITAL_COMMUNITY): Admission: RE | Payer: Self-pay | Source: Ambulatory Visit

## 2021-07-05 ENCOUNTER — Ambulatory Visit (HOSPITAL_COMMUNITY): Admission: RE | Admit: 2021-07-05 | Payer: Medicare Other | Source: Ambulatory Visit | Admitting: Ophthalmology

## 2021-07-05 SURGERY — PHACOEMULSIFICATION, CATARACT, WITH IOL INSERTION
Anesthesia: Monitor Anesthesia Care | Laterality: Left

## 2021-07-09 ENCOUNTER — Encounter (HOSPITAL_COMMUNITY): Admission: RE | Admit: 2021-07-09 | Payer: Medicare Other | Source: Ambulatory Visit

## 2021-07-09 DIAGNOSIS — S0093XA Contusion of unspecified part of head, initial encounter: Secondary | ICD-10-CM | POA: Diagnosis not present

## 2021-07-09 DIAGNOSIS — S060XAA Concussion with loss of consciousness status unknown, initial encounter: Secondary | ICD-10-CM | POA: Diagnosis not present

## 2021-07-16 ENCOUNTER — Ambulatory Visit: Admit: 2021-07-16 | Payer: Medicare Other | Admitting: Ophthalmology

## 2021-07-16 SURGERY — PHACOEMULSIFICATION, CATARACT, WITH IOL INSERTION
Anesthesia: Monitor Anesthesia Care | Laterality: Right

## 2021-07-29 DIAGNOSIS — R42 Dizziness and giddiness: Secondary | ICD-10-CM | POA: Diagnosis not present

## 2021-07-29 DIAGNOSIS — S060XAA Concussion with loss of consciousness status unknown, initial encounter: Secondary | ICD-10-CM | POA: Diagnosis not present

## 2021-08-03 DIAGNOSIS — S0093XA Contusion of unspecified part of head, initial encounter: Secondary | ICD-10-CM | POA: Diagnosis not present

## 2021-08-03 DIAGNOSIS — S060XAA Concussion with loss of consciousness status unknown, initial encounter: Secondary | ICD-10-CM | POA: Diagnosis not present

## 2021-08-25 DIAGNOSIS — G43701 Chronic migraine without aura, not intractable, with status migrainosus: Secondary | ICD-10-CM | POA: Diagnosis not present

## 2021-08-25 DIAGNOSIS — G47419 Narcolepsy without cataplexy: Secondary | ICD-10-CM | POA: Diagnosis not present

## 2021-08-25 DIAGNOSIS — I951 Orthostatic hypotension: Secondary | ICD-10-CM | POA: Diagnosis not present

## 2021-08-25 DIAGNOSIS — G894 Chronic pain syndrome: Secondary | ICD-10-CM | POA: Diagnosis not present

## 2021-08-25 DIAGNOSIS — M542 Cervicalgia: Secondary | ICD-10-CM | POA: Diagnosis not present

## 2021-08-25 DIAGNOSIS — M545 Low back pain, unspecified: Secondary | ICD-10-CM | POA: Diagnosis not present

## 2021-08-25 DIAGNOSIS — Z79891 Long term (current) use of opiate analgesic: Secondary | ICD-10-CM | POA: Diagnosis not present

## 2021-08-25 DIAGNOSIS — K219 Gastro-esophageal reflux disease without esophagitis: Secondary | ICD-10-CM | POA: Diagnosis not present

## 2021-08-25 DIAGNOSIS — R296 Repeated falls: Secondary | ICD-10-CM | POA: Diagnosis not present

## 2021-10-31 IMAGING — DX DG CHEST 1V PORT
1 series · 1 of 1 positions shown · non-contrast
Comparison: 02/05/2021

CLINICAL DATA: Chest pain and cough

EXAM:
PORTABLE CHEST 1 VIEW

[chest pa]
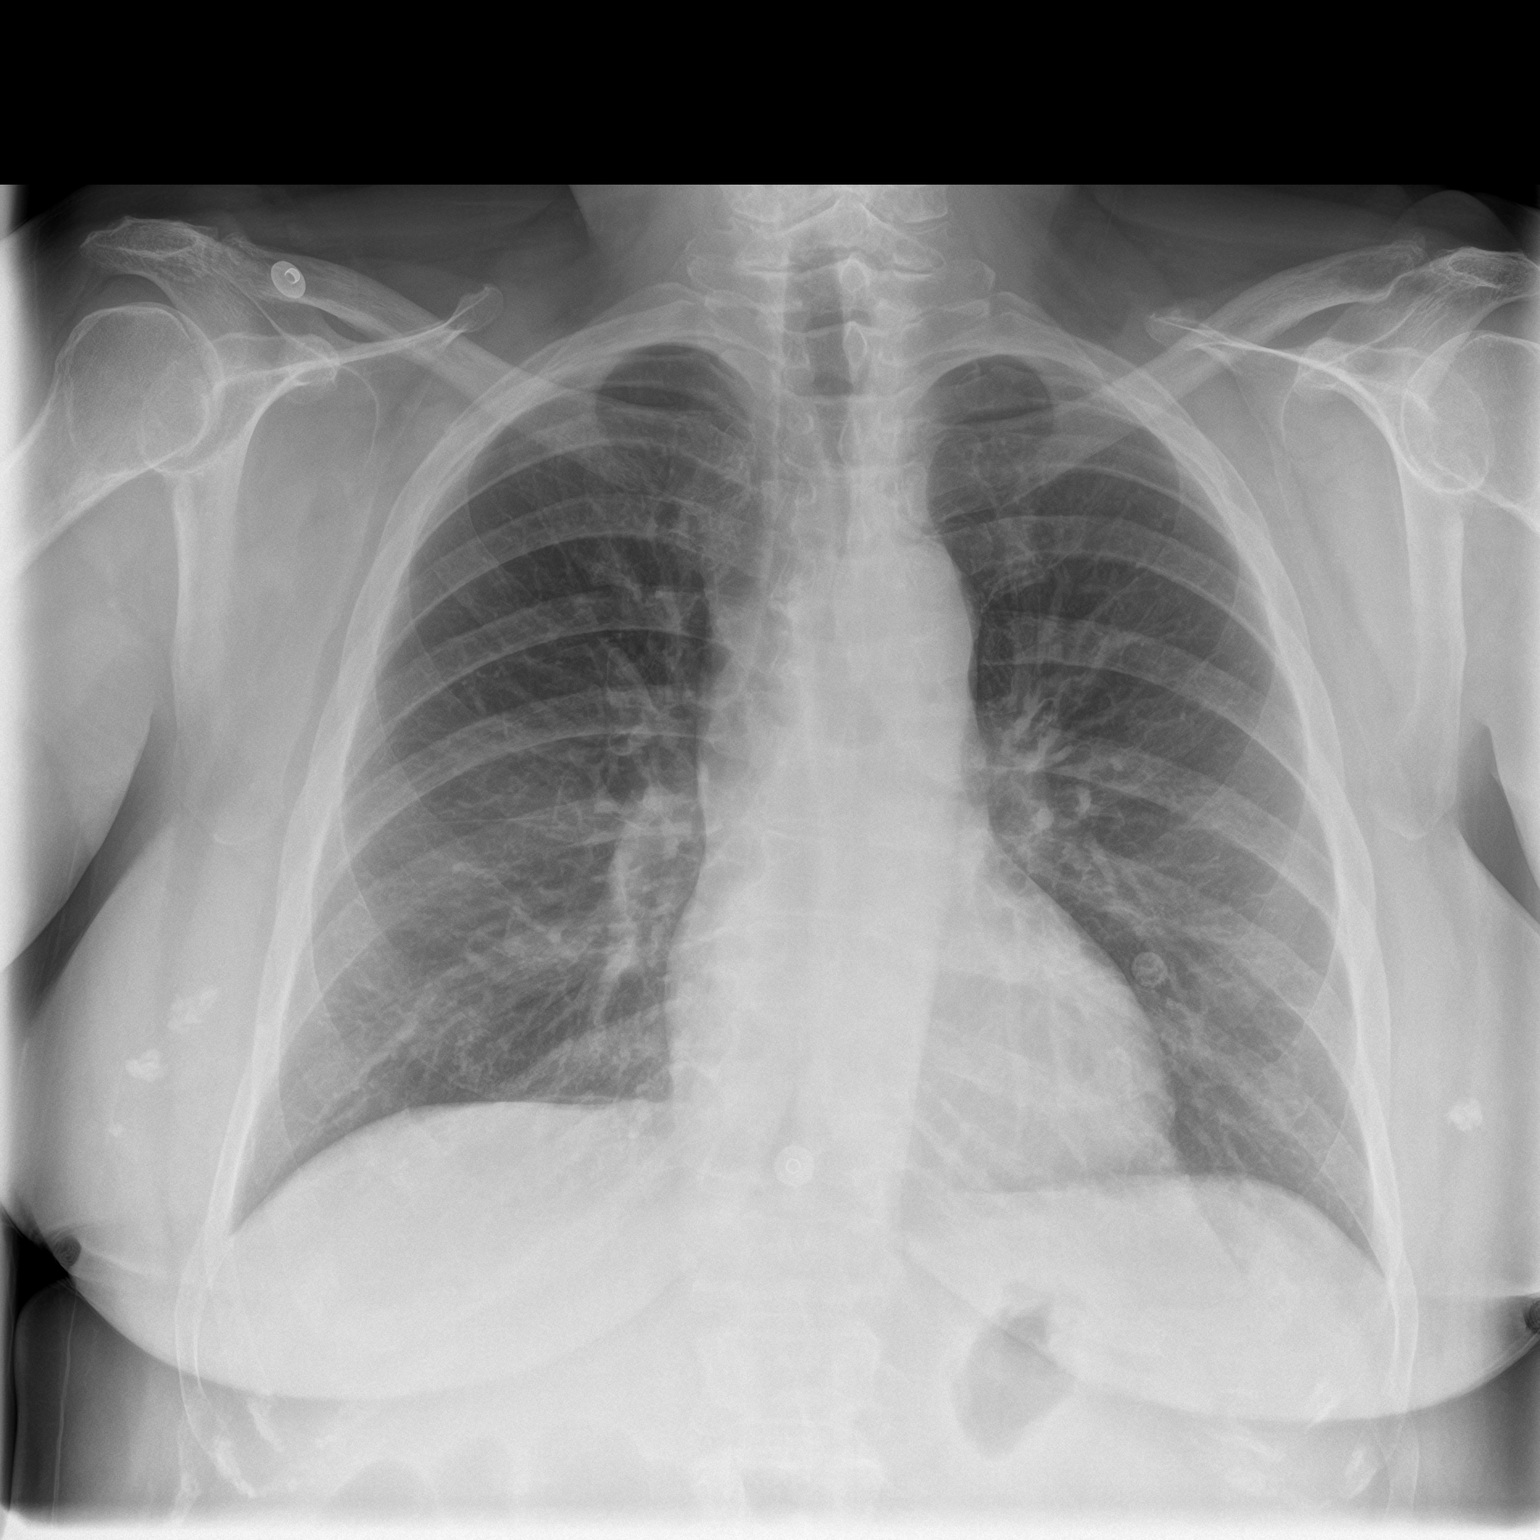

[1 of 1 positions shown; findings below may reference images not displayed]

FINDINGS: The heart size and mediastinal contours are within normal limits.
Both lungs are clear. The visualized skeletal structures are
unremarkable.
IMPRESSION: No active disease.

## 2021-12-30 ENCOUNTER — Ambulatory Visit (INDEPENDENT_AMBULATORY_CARE_PROVIDER_SITE_OTHER): Payer: Medicare Other | Admitting: Cardiology

## 2021-12-30 ENCOUNTER — Encounter: Payer: Self-pay | Admitting: Cardiology

## 2021-12-30 VITALS — BP 120/70 | HR 95 | Ht 66.0 in | Wt 193.2 lb

## 2021-12-30 DIAGNOSIS — G4733 Obstructive sleep apnea (adult) (pediatric): Secondary | ICD-10-CM | POA: Diagnosis not present

## 2021-12-30 DIAGNOSIS — R079 Chest pain, unspecified: Secondary | ICD-10-CM

## 2021-12-30 DIAGNOSIS — E782 Mixed hyperlipidemia: Secondary | ICD-10-CM

## 2021-12-30 DIAGNOSIS — R42 Dizziness and giddiness: Secondary | ICD-10-CM

## 2021-12-30 DIAGNOSIS — R0602 Shortness of breath: Secondary | ICD-10-CM

## 2021-12-30 DIAGNOSIS — R002 Palpitations: Secondary | ICD-10-CM | POA: Diagnosis not present

## 2021-12-30 NOTE — Progress Notes (Signed)
? ? ?Cardiology Office Note ? ?Date: 12/30/2021  ? ?ID: Cindy Norris, DOB Jun 05, 1956, MRN [id number] ? ?PCP:  [facility]  ?Cardiologist:  Fransico Him, MD ?Electrophysiologist:  None  ? ?Chief Complaint: Follow-up cardiac monitor, stress test, echocardiogram, ED follow-up. ? ?History of Present Illness: ?Cindy Norris is a 66 y.o. female with a history of hyperlipidemia, obesity, seizure disorder, sleep apnea, GERD, chronic pain syndrome, palpitations.  She had a stress test done in 2022 that showed no ischemia and 2D echo showed normal LV function with EF 55 to 60%.  14-day ZIO monitor showed rare PAC and PVC with 3 episodes of SVT lasting about 12 seconds on average with a heart rate of 140 bpm. ? ?She is here today for followup and is doing well.  She continues to have atypical CP similar to when she had her stress test done and was felt to be noncardiac.  It occurs mainly with extreme exertion like working in the yard.  At other times it occurs when watching TV.  It usually lasts a few seconds to min and then resolves.  It is a sharp pain and hurts to take a deep breath in when she gets the pain. She has chronic DOE which she thinks might be worse but feels more like a sensation that she has to take a deep breath. She has asthma and inhalers help with her SOB. She denies any  PND, orthopnea, LE edema, dizziness or syncope. She still has palpitations from time to time but thinks they are less often now. She is compliant with her meds and is tolerating meds with no SE.    ?. ?Past Medical History:  ?Diagnosis Date  ? Asthma   ? Chronic pain syndrome   ? Depression   ? Diabetes mellitus   ? GERD (gastroesophageal reflux disease)   ? Hyperlipidemia, mixed   ? Migraines   ? Narcolepsy   ? Osteoarthritis   ? Overweight(278.02)   ? Obesity  ? Seizures ([place])   ? unknown etiology-on meds-last seizure was several years ago  ? Unspecified essential hypertension   ? ? ?Past Surgical History:  ?Procedure  Laterality Date  ? BREAST MASS EXCISION Right   ? KNEE ARTHROSCOPY    ? PILONIDAL CYST EXCISION    ? TOTAL KNEE ARTHROPLASTY Bilateral   ? ? ?Current Outpatient Medications  ?Medication Sig Dispense Refill  ? albuterol (VENTOLIN HFA) 108 (90 Base) MCG/ACT inhaler Inhale 1-2 puffs into the lungs as needed for wheezing or shortness of breath.    ? atorvastatin (LIPITOR) 80 MG tablet Take 80 mg by mouth at bedtime.    ? busPIRone (BUSPAR) 10 MG tablet Take 10 mg by mouth 2 (two) times daily.    ? cyclobenzaprine (FLEXERIL) 10 MG tablet Take 10 mg by mouth in the morning and at bedtime.    ? Dulaglutide 0.75 MG/0.5ML SOPN Inject 0.75 mg into the skin every Wednesday.    ? gabapentin (NEURONTIN) 800 MG tablet Take 800 mg by mouth 2 (two) times daily.     ? Homeopathic Products (EARACHE DROPS OT) Place 2 drops in ear(s) daily as needed (ear pain).    ? HYDROcodone-acetaminophen (NORCO) 7.5-325 MG tablet Take 1 tablet by mouth 2 (two) times daily as needed for moderate pain.    ? hydrOXYzine (ATARAX/VISTARIL) 25 MG tablet Take 25 mg by mouth 2 (two) times daily.    ? ibuprofen (ADVIL,MOTRIN) 800 MG tablet Take 800 mg by mouth every  8 (eight) hours as needed for moderate pain.    ? levETIRAcetam (KEPPRA) 1000 MG tablet Take 1,000 mg by mouth 2 (two) times daily.    ? metFORMIN (GLUCOPHAGE-XR) 500 MG 24 hr tablet Take 500 mg by mouth 2 (two) times daily.    ? midodrine (PROAMATINE) 10 MG tablet Take 10 mg by mouth 2 (two) times daily.    ? modafinil (PROVIGIL) 200 MG tablet Take 400 mg by mouth every morning.     ? Multiple Vitamin (MULTIVITAMIN WITH MINERALS) TABS tablet Take 1 tablet by mouth daily.    ? nabumetone (RELAFEN) 750 MG tablet Take 750 mg by mouth 2 (two) times daily.    ? omeprazole (PRILOSEC) 40 MG capsule Take 80 mg by mouth at bedtime.    ? oxybutynin (DITROPAN) 5 MG tablet Take 5 mg by mouth 2 (two) times daily.    ? Polyethyl Glycol-Propyl Glycol (SYSTANE OP) Place 1 drop into both eyes 2 (two) times  daily.    ? SUMAtriptan (IMITREX) 50 MG tablet Take 50-100 mg by mouth every 2 (two) hours as needed for migraine. May repeat in 2 hours if headache persists or recurs.    ? topiramate (TOPAMAX) 100 MG tablet Take 100 mg by mouth 2 (two) times daily.    ? venlafaxine XR (EFFEXOR-XR) 150 MG 24 hr capsule Take 300 mg by mouth daily.    ? zolpidem (AMBIEN) 5 MG tablet Take 5 mg by mouth at bedtime as needed for sleep.    ? benzonatate (TESSALON) 100 MG capsule Take 1 capsule (100 mg total) by mouth every 8 (eight) hours. (Patient not taking: Reported on 07/02/2021) 21 capsule 0  ? ondansetron (ZOFRAN) 4 MG tablet Take 1 tablet (4 mg total) by mouth every 6 (six) hours. (Patient not taking: Reported on 12/30/2021) 12 tablet 0  ? ?No current facility-administered medications for this visit.  ? ?Allergies:  Azithromycin, Other, Tape, Erythromycin, Hydromorphone hcl, Lyrica [pregabalin], Penicillins, Reglan [metoclopramide], and Sulfonamide derivatives  ? ?Social History: The patient  reports that she quit smoking about 33 years ago. Her smoking use included cigarettes. She has a 2.00 pack-year smoking history. She has never used smokeless tobacco. She reports that she does not drink alcohol and does not use drugs.  ? ?Family History: The patient's family history includes Coronary artery disease in an other family member.  ? ?ROS:  Please see the history of present illness. Otherwise, complete review of systems is positive for none.  All other systems are reviewed and negative.  ? ?Physical Exam: ?VS:  BP 120/70   Pulse 95   Ht 5\' 6"  (1.676 m)   Wt 193 lb 3.2 oz (87.6 kg)   SpO2 97%   BMI 31.18 kg/m? , BMI Body mass index is 31.18 kg/m?. ? ?Wt Readings from Last 3 Encounters:  ?12/30/21 193 lb 3.2 oz (87.6 kg)  ?07/03/21 205 lb (93 kg)  ?05/18/21 209 lb 6.4 oz (95 kg)  ?  ?GEN: Well nourished, well developed in no acute distress ?HEENT: Normal ?NECK: No JVD; No carotid bruits ?LYMPHATICS: No lymphadenopathy ?CARDIAC:RRR,  no murmurs, rubs, gallops ?RESPIRATORY:  Clear to auscultation without rales, wheezing or rhonchi  ?ABDOMEN: Soft, non-tender, non-distended ?MUSCULOSKELETAL:  No edema; No deformity  ?SKIN: Warm and dry ?NEUROLOGIC:  Alert and oriented x 3 ?PSYCHIATRIC:  Normal affect   ? ?ECG: Is not performed today ? ?Recent Labwork: ?05/03/2021: BUN 16; Creatinine, Ser 0.74; Hemoglobin 12.3; Platelets 241; Potassium 3.4; Sodium 141  ?  No results found for: CHOL, TRIG, HDL, CHOLHDL, VLDL, LDLCALC, LDLDIRECT ? ?Other Studies Reviewed Today: ? ?37 Zio Monitor 02/26/2021 ?Study Highlights ? ?ZIO XT reviewed.  13 days, 17 hours analyzed.  Predominant rhythm is sinus with heart rate ranging from 58 bpm up to 152 bpm and average heart rate 95 bpm.  There were rare PACs including couplets and triplets representing less than 1% total beats.  There were rare PVCs including couplets representing less than 1% total beats.  Three episodes of SVT were noted, the longest of which lasted for approximately 12 seconds with average heart rate 148 bpm.  No pauses. ? ? ? ?Echocardiogram 04/21/2021  ?1. Left ventricular ejection fraction, by estimation, is 55 to 60%. The left ventricle has normal ?function. The left ventricle has no regional wall motion abnormalities. Left ventricular ?diastolic parameters are consistent with Grade I diastolic dysfunction (impaired relaxation). ?Elevated left atrial pressure. ?2. Right ventricular systolic function is normal. The right ventricular size is normal. Tricuspid ?regurgitation signal is inadequate for assessing PA pressure. ?3. The mitral valve is normal in structure. No evidence of mitral valve regurgitation. No ?evidence of mitral stenosis. ?4. The aortic valve is tricuspid. Aortic valve regurgitation is not visualized. No aortic stenosis is ?present. ?5. The inferior vena cava is normal in size with greater than 50% respiratory variability, ?suggesting right atrial pressure of 3 mmHg. ? ? ? ?Nuclear stress  test 03/16/2021 ?Study Result ? ?Narrative & Impression  ?There was no ST segment deviation noted during stress. ?The study is normal. There are no perfusion defects consistent with prior infarct or current ischemia.

## 2021-12-30 NOTE — Patient Instructions (Signed)
Medication Instructions:  Your physician recommends that you continue on your current medications as directed. Please refer to the Current Medication list given to you today.  *If you need a refill on your cardiac medications before your next appointment, please call your pharmacy*  Follow-Up: At [facility], you and your health needs are our priority.  As part of our continuing mission to provide you with exceptional heart care, we have created designated Provider Care Teams.  These Care Teams include your primary Cardiologist (physician) and Advanced Practice Providers (APPs -  Physician Assistants and Nurse Practitioners) who all work together to provide you with the care you need, when you need it.   Your next appointment:   1 year(s)  The format for your next appointment:   In Person  Provider:   You may see Traci Turner, MD or one of the following Advanced Practice Providers on your designated Care Team:   Brittany Strader, PA-C  Michele Lenze, PA-C   

## 2022-04-29 ENCOUNTER — Encounter (HOSPITAL_COMMUNITY)
Admission: RE | Admit: 2022-04-29 | Discharge: 2022-04-29 | Disposition: A | Discharge status: Home / Self Care | Payer: Medicare Other | Source: Ambulatory Visit | Attending: Ophthalmology | Admitting: Ophthalmology

## 2022-04-29 NOTE — Pre-Procedure Instructions (Signed)
Attempted pre-op phone call. Left VM on [phone] for her to call back.

## 2022-05-02 ENCOUNTER — Encounter (HOSPITAL_COMMUNITY): Payer: Self-pay

## 2022-05-03 NOTE — H&P (Signed)
Surgical History & Physical  Patient Name: Malayzia Laforte DOB: 1956/05/29  Surgery: Cataract extraction with intraocular lens implant phacoemulsification; Left Eye  Surgeon: Fabio Pierce MD Surgery Date:  05-09-22 Pre-Op Date:  05-02-22  HPI: A 51 Yr. old female patient The patient is returning for a repeat cataract evaluation follow-up of both eyes. Since the last visit, the affected area is worsening. The patient's vision is blurry. The complaint is associated with difficulty reading store signs, experiencing glare on bright sunny days, and recognizing people at a distance. This is negatively affecting the patient's quality of life and the patient is unable to function adequately in life with the current level of vision. HPI was performed by Fabio Pierce .  Medical History: Dry Eyes Cataracts gland dysfunction Anxiety disorder, Bilateral Peripheral neuropathy ... Arthritis Diabetes - DM Type 2  Review of Systems Negative Allergic/Immunologic Negative Cardiovascular Negative Constitutional Negative Ear, Nose, Mouth & Throat Negative Endocrine Negative Eyes Negative Gastrointestinal Negative Genitourinary Negative Hemotologic/Lymphatic Negative Integumentary Negative Musculoskeletal Negative Neurological Negative Psychiatry Negative Respiratory  Social   Never smoked   Medication Lipitor, Lisinopril, Metformin, Gabapentin, Keppra,   Sx/Procedures Bilateral replacement of knee joints, Carpal Tunnel, Surgical repair, Lumpectomy of left breast, Cyst removal from back, Rt/ lt elbow, Rt/lt shoulder,   Drug Allergies  Lyrica, Sulfa, Adhesive tape, Dawn detergent with oil of olay,   History & Physical: Heent: Cataract, left eye NECK: supple without bruits LUNGS: lungs clear to auscultation CV: regular rate and rhythm Abdomen: soft and non-tender Impression & Plan: Assessment: 1.  COMBINED FORMS AGE RELATED CATARACT; Both Eyes (H25.813) 2.  ASTIGMATISM, REGULAR; Both  Eyes (H52.223) 3.  BLEPHARITIS; Right Upper Lid, Right Lower Lid, Left Upper Lid, Left Lower Lid (H01.001, H01.002,H01.004,H01.005) 4.  DERMATOCHALASIS; Right Upper Lid, Left Upper Lid (H02.831, H02.834) 5.  Diabetes Type 2 No retinopathy (E11.9) 6.  DERMATOCHALASIS, no surgery; Right Upper Lid, Left Upper Lid (H02.831, H02.834) 7.  Trichiasis; Left Lower Lid (H02.015)  Plan: 1.  Cataract accounts for the patient's decreased vision. This visual impairment is not correctable with a tolerable change in glasses or contact lenses. Cataract surgery with an implantation of a new lens should significantly improve the visual and functional status of the patient. Discussed all risks, benefits, alternatives, and potential complications. Discussed the procedures and recovery. Patient desires to have surgery. A-scan ordered and performed today for intra-ocular lens calculations. The surgery will be performed in order to improve vision for driving, reading, and for eye examinations. Recommend phacoemulsification with intra-ocular lens. Recommend Dextenza for post-operative pain and inflammation. Left Eye - worse Dilates poorly - shugacaine by protocol. Malyugin Ring. Omidira. Discussed toric IOL OS only.  2.  Much worse OS. Recommend Toric IOL OS only.  3.  Recommend regular lid cleaning.  4.  Asymptomatic, recommend observation for now. Findings, prognosis and treatment options reviewed.  5.  Stressed importance of blood sugar and blood pressure control, and also yearly eye examinations. Discussed the need for ongoing proactive ocular exams and treatment, hopefully before visual symptoms develop.  6.  Recommend regular lid cleaning  7.  Lashes removed at slit lamp today.

## 2022-05-09 ENCOUNTER — Encounter (HOSPITAL_COMMUNITY)
Admission: RE | Disposition: A | Discharge status: Home / Self Care | Payer: Self-pay | Source: Ambulatory Visit | Attending: Ophthalmology

## 2022-05-09 ENCOUNTER — Encounter (HOSPITAL_COMMUNITY): Payer: Self-pay | Admitting: Ophthalmology

## 2022-05-09 ENCOUNTER — Ambulatory Visit (HOSPITAL_COMMUNITY)
Admission: RE | Admit: 2022-05-09 | Discharge: 2022-05-09 | Disposition: A | Discharge status: Home / Self Care | Payer: Medicare Other | Source: Ambulatory Visit | Attending: Ophthalmology | Admitting: Ophthalmology

## 2022-05-09 ENCOUNTER — Ambulatory Visit (HOSPITAL_COMMUNITY): Payer: Medicare Other | Admitting: Anesthesiology

## 2022-05-09 ENCOUNTER — Ambulatory Visit (HOSPITAL_BASED_OUTPATIENT_CLINIC_OR_DEPARTMENT_OTHER): Payer: Medicare Other | Admitting: Anesthesiology

## 2022-05-09 DIAGNOSIS — K219 Gastro-esophageal reflux disease without esophagitis: Secondary | ICD-10-CM | POA: Diagnosis not present

## 2022-05-09 DIAGNOSIS — H02831 Dermatochalasis of right upper eyelid: Secondary | ICD-10-CM | POA: Insufficient documentation

## 2022-05-09 DIAGNOSIS — Z87891 Personal history of nicotine dependence: Secondary | ICD-10-CM

## 2022-05-09 DIAGNOSIS — Z7984 Long term (current) use of oral hypoglycemic drugs: Secondary | ICD-10-CM | POA: Insufficient documentation

## 2022-05-09 DIAGNOSIS — I10 Essential (primary) hypertension: Secondary | ICD-10-CM

## 2022-05-09 DIAGNOSIS — H01001 Unspecified blepharitis right upper eyelid: Secondary | ICD-10-CM | POA: Diagnosis not present

## 2022-05-09 DIAGNOSIS — G4730 Sleep apnea, unspecified: Secondary | ICD-10-CM | POA: Insufficient documentation

## 2022-05-09 DIAGNOSIS — G40909 Epilepsy, unspecified, not intractable, without status epilepticus: Secondary | ICD-10-CM | POA: Diagnosis not present

## 2022-05-09 DIAGNOSIS — H02834 Dermatochalasis of left upper eyelid: Secondary | ICD-10-CM | POA: Insufficient documentation

## 2022-05-09 DIAGNOSIS — E119 Type 2 diabetes mellitus without complications: Secondary | ICD-10-CM

## 2022-05-09 DIAGNOSIS — H02015 Cicatricial entropion of left lower eyelid: Secondary | ICD-10-CM | POA: Insufficient documentation

## 2022-05-09 DIAGNOSIS — H25812 Combined forms of age-related cataract, left eye: Secondary | ICD-10-CM | POA: Insufficient documentation

## 2022-05-09 DIAGNOSIS — H01005 Unspecified blepharitis left lower eyelid: Secondary | ICD-10-CM | POA: Insufficient documentation

## 2022-05-09 DIAGNOSIS — E1136 Type 2 diabetes mellitus with diabetic cataract: Secondary | ICD-10-CM | POA: Insufficient documentation

## 2022-05-09 DIAGNOSIS — H52223 Regular astigmatism, bilateral: Secondary | ICD-10-CM | POA: Insufficient documentation

## 2022-05-09 DIAGNOSIS — E1142 Type 2 diabetes mellitus with diabetic polyneuropathy: Secondary | ICD-10-CM | POA: Insufficient documentation

## 2022-05-09 DIAGNOSIS — H269 Unspecified cataract: Secondary | ICD-10-CM

## 2022-05-09 DIAGNOSIS — H01004 Unspecified blepharitis left upper eyelid: Secondary | ICD-10-CM | POA: Insufficient documentation

## 2022-05-09 DIAGNOSIS — H01002 Unspecified blepharitis right lower eyelid: Secondary | ICD-10-CM | POA: Insufficient documentation

## 2022-05-09 HISTORY — PX: CATARACT EXTRACTION W/PHACO: SHX586

## 2022-05-09 LAB — GLUCOSE, CAPILLARY: Glucose-Capillary: 133 mg/dL — ABNORMAL HIGH (ref 70–99)

## 2022-05-09 SURGERY — PHACOEMULSIFICATION, CATARACT, WITH IOL INSERTION
Anesthesia: Monitor Anesthesia Care | Site: Eye | Laterality: Left

## 2022-05-09 MED ORDER — BSS IO SOLN
INTRAOCULAR | Status: DC | PRN
  Administered 2022-05-09: 15 mL via INTRAOCULAR

## 2022-05-09 MED ORDER — POVIDONE-IODINE 5 % OP SOLN
OPHTHALMIC | Status: DC | PRN
  Administered 2022-05-09: 1 via OPHTHALMIC

## 2022-05-09 MED ORDER — LIDOCAINE HCL 3.5 % OP GEL
1.0000 | Freq: Once | OPHTHALMIC | Status: AC
Start: 2022-05-09 — End: 2022-05-09
  Administered 2022-05-09: 1 via OPHTHALMIC

## 2022-05-09 MED ORDER — TETRACAINE HCL 0.5 % OP SOLN
1.0000 [drp] | OPHTHALMIC | Status: AC | PRN
  Administered 2022-05-09 (×3): 1 [drp] via OPHTHALMIC

## 2022-05-09 MED ORDER — EPINEPHRINE PF 1 MG/ML IJ SOLN
INTRAMUSCULAR | Status: AC
  Filled 2022-05-09: qty 1

## 2022-05-09 MED ORDER — LIDOCAINE HCL (PF) 1 % IJ SOLN
INTRAOCULAR | Status: DC | PRN
  Administered 2022-05-09: 1 mL via OPHTHALMIC

## 2022-05-09 MED ORDER — PHENYLEPHRINE HCL 2.5 % OP SOLN
1.0000 [drp] | OPHTHALMIC | Status: AC | PRN
  Administered 2022-05-09 (×3): 1 [drp] via OPHTHALMIC

## 2022-05-09 MED ORDER — SODIUM HYALURONATE 23MG/ML IO SOSY
PREFILLED_SYRINGE | INTRAOCULAR | Status: DC | PRN
  Administered 2022-05-09: 0.6 mL via INTRAOCULAR

## 2022-05-09 MED ORDER — STERILE WATER FOR IRRIGATION IR SOLN
Status: DC | PRN
  Administered 2022-05-09: 250 mL

## 2022-05-09 MED ORDER — TROPICAMIDE 1 % OP SOLN
1.0000 [drp] | OPHTHALMIC | Status: AC | PRN
  Administered 2022-05-09 (×3): 1 [drp] via OPHTHALMIC

## 2022-05-09 MED ORDER — PHENYLEPHRINE-KETOROLAC 1-0.3 % IO SOLN
INTRAOCULAR | Status: DC | PRN
  Administered 2022-05-09: 500 mL via OPHTHALMIC

## 2022-05-09 MED ORDER — SODIUM HYALURONATE 10 MG/ML IO SOLUTION
PREFILLED_SYRINGE | INTRAOCULAR | Status: DC | PRN
  Administered 2022-05-09: 0.85 mL via INTRAOCULAR

## 2022-05-09 MED ORDER — PHENYLEPHRINE-KETOROLAC 1-0.3 % IO SOLN
INTRAOCULAR | Status: AC
  Filled 2022-05-09: qty 4

## 2022-05-09 SURGICAL SUPPLY — 12 items
CATARACT SUITE SIGHTPATH (MISCELLANEOUS) ×2 IMPLANT
CLOTH BEACON ORANGE TIMEOUT ST (SAFETY) ×2 IMPLANT
EYE SHIELD UNIVERSAL CLEAR (GAUZE/BANDAGES/DRESSINGS) ×1 IMPLANT
FEE CATARACT SUITE SIGHTPATH (MISCELLANEOUS) ×1 IMPLANT
GLOVE BIOGEL PI IND STRL 7.0 (GLOVE) ×2 IMPLANT
GLOVE BIOGEL PI INDICATOR 7.0 (GLOVE) ×2
LENS IOL RAYNER 19.5 (Intraocular Lens) ×2 IMPLANT
LENS IOL RAYONE EMV 19.5 (Intraocular Lens) IMPLANT
PAD ARMBOARD 7.5X6 YLW CONV (MISCELLANEOUS) ×2 IMPLANT
SYR TB 1ML LL NO SAFETY (SYRINGE) ×2 IMPLANT
TAPE PAPER 1X10 WHT MICROPORE (GAUZE/BANDAGES/DRESSINGS) ×1 IMPLANT
WATER STERILE IRR 250ML POUR (IV SOLUTION) ×2 IMPLANT

## 2022-05-09 NOTE — Anesthesia Preprocedure Evaluation (Signed)
Anesthesia Evaluation  Patient identified by MRN, date of birth, ID band Patient awake    Reviewed: Allergy & Precautions, NPO status , Patient's Chart, lab work & pertinent test results  Airway Mallampati: II  TM Distance: >3 FB Neck ROM: Full    Dental  (+) Edentulous Upper, Edentulous Lower   Pulmonary asthma , sleep apnea (moderate to severe ) , former smoker,    Pulmonary exam normal breath sounds clear to auscultation       Cardiovascular Exercise Tolerance: Poor hypertension, Pt. on medications Normal cardiovascular exam Rhythm:Regular Rate:Normal  Verta Ellen., NP  04/25/2021 7:25 PM EDT   Please call the patient and let her know the echocardiogram showed she has good pumping function of her heart. The main pumping chamber is a little stiff and has problems relaxing. No leaking valves noted.   Verta Ellen, NP  04/25/2021 7:25 PM      Neuro/Psych  Headaches, Seizures -, Well Controlled,  PSYCHIATRIC DISORDERS Depression    GI/Hepatic Neg liver ROS, GERD  Medicated and Controlled,  Endo/Other  diabetes, Well Controlled, Type 2, Oral Hypoglycemic AgentsMorbid obesity  Renal/GU negative Renal ROS  negative genitourinary   Musculoskeletal  (+) Arthritis , Osteoarthritis,    Abdominal   Peds negative pediatric ROS (+)  Hematology negative hematology ROS (+)   Anesthesia Other Findings   Reproductive/Obstetrics negative OB ROS                            Anesthesia Physical Anesthesia Plan  ASA: 3  Anesthesia Plan: MAC   Post-op Pain Management: Minimal or no pain anticipated   Induction: Intravenous  PONV Risk Score and Plan:   Airway Management Planned:   Additional Equipment:   Intra-op Plan:   Post-operative Plan:   Informed Consent: I have reviewed the patients History and Physical, chart, labs and discussed the procedure including the risks, benefits  and alternatives for the proposed anesthesia with the patient or authorized representative who has indicated his/her understanding and acceptance.       Plan Discussed with: CRNA and Surgeon  Anesthesia Plan Comments:        Anesthesia Quick Evaluation

## 2022-05-09 NOTE — Anesthesia Postprocedure Evaluation (Signed)
Anesthesia Post Note  Patient: Cindy Norris  Procedure(s) Performed: CATARACT EXTRACTION PHACO AND INTRAOCULAR LENS PLACEMENT (IOC) (Left: Eye)  Patient location during evaluation: Short Stay Anesthesia Type: MAC Level of consciousness: awake and alert Pain management: pain level controlled Vital Signs Assessment: post-procedure vital signs reviewed and stable Respiratory status: spontaneous breathing Cardiovascular status: blood pressure returned to baseline and stable Postop Assessment: no apparent nausea or vomiting Anesthetic complications: no   No notable events documented.   Last Vitals:  Vitals:   05/09/22 1049 05/09/22 1124  BP: 127/78 132/63  Pulse: 79 78  Resp: 14 15  Temp: 36.9 C 36.9 C  SpO2: 98% 100%    Last Pain:  Vitals:   05/09/22 1124  TempSrc: Oral  PainSc: 0-No pain                 Adalia Pettis

## 2022-05-09 NOTE — Op Note (Signed)
Date of procedure: 05/09/22  Pre-operative diagnosis: Visually significant age-related combined cataract, Left Eye (H25.812)  Post-operative diagnosis: Visually significant age-related combined cataract, Left Eye (H25.812)  Procedure: Removal of cataract via phacoemulsification and insertion of intra-ocular lens Rayner [id number] +19.5D into the capsular bag of the Left Eye  Attending surgeon: Gerda Diss. Corina Stacy, MD, MA  Anesthesia: MAC, Topical Akten  Complications: None  Estimated Blood Loss: <37m (minimal)  Specimens: None  Implants: As above  Indications:  Visually significant age-related cataract, Left Eye  Procedure:  The patient was seen and identified in the pre-operative area. The operative eye was identified and dilated.  The operative eye was marked.  Topical anesthesia was administered to the operative eye.     The patient was then to the operative suite and placed in the supine position.  A timeout was performed confirming the patient, procedure to be performed, and all other relevant information.   The patient's face was prepped and draped in the usual fashion for intra-ocular surgery.  A lid speculum was placed into the operative eye and the surgical microscope moved into place and focused.  An inferotemporal paracentesis was created using a 20 gauge paracentesis blade.  Shugarcaine was injected into the anterior chamber.  Viscoelastic was injected into the anterior chamber.  A temporal clear-corneal main wound incision was created using a 2.482mmicrokeratome.  A continuous curvilinear capsulorrhexis was initiated using an irrigating cystitome and completed using capsulorrhexis forceps.  Hydrodissection and hydrodeliniation were performed.  Viscoelastic was injected into the anterior chamber.  A phacoemulsification handpiece and a chopper as a second instrument were used to remove the nucleus and epinucleus. The irrigation/aspiration handpiece was used to remove any remaining  cortical material.   The capsular bag was reinflated with viscoelastic, checked, and found to be intact.  The intraocular lens was inserted into the capsular bag.  The irrigation/aspiration handpiece was used to remove any remaining viscoelastic.  The clear corneal wound and paracentesis wounds were then hydrated and checked with Weck-Cels to be watertight. The lid-speculum was removed.  The drape was removed.  The patient's face was cleaned with a wet and dry 4x4.    A clear shield was taped over the eye. The patient was taken to the post-operative care unit in good condition, having tolerated the procedure well.  [facility]-Op Instructions: [facility] and will receive all other orders and instructions.

## 2022-05-09 NOTE — Transfer of Care (Signed)
Immediate Anesthesia Transfer of Care Note  Patient: Cindy Norris  Procedure(s) Performed: CATARACT EXTRACTION PHACO AND INTRAOCULAR LENS PLACEMENT (IOC) (Left: Eye)  Patient Location: Short Stay  Anesthesia Type:MAC  Level of Consciousness: awake  Airway & Oxygen Therapy: Patient Spontanous Breathing  Post-op Assessment: Report given to RN  Post vital signs: Reviewed and stable  Last Vitals:  Vitals Value Taken Time  BP    Temp 36.9 C 05/09/22 1124  Pulse 78 05/09/22 1124  Resp 15 05/09/22 1124  SpO2 100 % 05/09/22 1124    Last Pain:  Vitals:   05/09/22 1124  TempSrc: Oral  PainSc: 0-No pain      Patients Stated Pain Goal: 8 (98/10/25 4862)  Complications: No notable events documented.

## 2022-05-09 NOTE — Discharge Instructions (Signed)
Please discharge patient when stable, will follow up today with Dr. Ariel Dimitri at the [facility] [place] office immediately following discharge.  Leave shield in place until visit.  All paperwork with discharge instructions will be given at the office.  [facility] Address:  [address]  [place], [place] [address]  

## 2022-05-09 NOTE — Interval H&P Note (Signed)
History and Physical Interval Note:  05/09/2022 10:57 AM  Cindy Norris  has presented today for surgery, with the diagnosis of combined forms age related cataract; left.  The various methods of treatment have been discussed with the patient and family. After consideration of risks, benefits and other options for treatment, the patient has consented to  Procedure(s) with comments: CATARACT EXTRACTION PHACO AND INTRAOCULAR LENS PLACEMENT (IOC) (Left) - CDE: as a surgical intervention.  The patient's history has been reviewed, patient examined, no change in status, stable for surgery.  I have reviewed the patient's chart and labs.  Questions were answered to the patient's satisfaction.     Fabio Pierce

## 2022-05-10 ENCOUNTER — Encounter (HOSPITAL_COMMUNITY): Payer: Self-pay | Admitting: Ophthalmology

## 2022-05-16 ENCOUNTER — Encounter (HOSPITAL_COMMUNITY)
Admission: RE | Admit: 2022-05-16 | Discharge: 2022-05-16 | Disposition: A | Discharge status: Home / Self Care | Payer: Medicare Other | Source: Ambulatory Visit | Attending: Ophthalmology | Admitting: Ophthalmology

## 2022-05-17 ENCOUNTER — Other Ambulatory Visit: Payer: Self-pay

## 2022-05-17 ENCOUNTER — Encounter (HOSPITAL_COMMUNITY): Payer: Self-pay

## 2022-05-20 NOTE — H&P (Signed)
Surgical History & Physical  Patient Name: Cindy Norris DOB: 11/17/1955  Surgery: Cataract extraction with intraocular lens implant phacoemulsification; Right Eye  Surgeon: Fabio Pierce MD Surgery Date:  05-23-22 Pre-Op Date:  05-16-22  HPI: A 76 Yr. old female patient 1. The patient is returning after cataract surgery. The left eye is affected. Status post cataract surgery, which began 1 weeks ago: Since the last visit, the affected area is doing well. The patient's vision is improved. Patient is following medication instructions. 2. 2. The patient is returning for a cataract follow-up of the right eye. Since the last visit, the affected area is worsening. The patient's vision is blurry. The complaint is associated with difficulty driving at night due to halo's/glare, recognizing people at a distance, and reading small print on medicine bottles/labels. This is negatively affecting the patient's quality of life and the patient is unable to function adequately in life with the current level of vision. HPI Completed by Dr. Fabio Pierce HPI was performed by Fabio Pierce .  Medical History: Dry Eyes Cataracts gland dysfunction Anxiety disorder, Bilateral Peripheral neuropathy ... Arthritis Diabetes - DM Type 2  Review of Systems Negative Allergic/Immunologic Negative Cardiovascular Negative Constitutional Negative Ear, Nose, Mouth & Throat Negative Endocrine Negative Eyes Negative Gastrointestinal Negative Genitourinary Negative Hemotologic/Lymphatic Negative Integumentary Negative Musculoskeletal Negative Neurological Negative Psychiatry Negative Respiratory  Social   Never smoked   Medication Moxifloxacin, Ilevro,  Lipitor, Lisinopril, Metformin, Gabapentin, Keppra,   Sx/Procedures Phaco c IOL OS,  Bilateral replacement of knee joints, Carpal Tunnel, Surgical repair, Lumpectomy of left breast, Cyst removal from back, Rt/ lt elbow, Rt/lt shoulder,   Drug  Allergies  Lyrica, Sulfa, Adhesive tape, Dawn detergent with oil of olay,   History & Physical: Heent: Cataract, right eye NECK: supple without bruits LUNGS: lungs clear to auscultation CV: regular rate and rhythm Abdomen: soft and non-tender Impression & Plan: Assessment: 1.  CATARACT EXTRACTION STATUS; Left Eye (Z98.42) 2.  COMBINED FORMS AGE RELATED CATARACT; Both Eyes (H25.813) 3.  Trichiasis; Left Lower Lid (H02.015)  Plan: 1.  1 week after cataract surgery. Doing well with improved vision and normal eye pressure. Call with any problems or concerns. Stop Vigamox. Continue Ilevro 1 drop 1x/day for 3 more weeks. Continue Pred Acetate 1 drop 2x/day for 3 more weeks.  2.  Cataract accounts for the patient's decreased vision. This visual impairment is not correctable with a tolerable change in glasses or contact lenses. Cataract surgery with an implantation of a new lens should significantly improve the visual and functional status of the patient. Discussed all risks, benefits, alternatives, and potential complications. Discussed the procedures and recovery. Patient desires to have surgery. A-scan ordered and performed today for intra-ocular lens calculations. The surgery will be performed in order to improve vision for driving, reading, and for eye examinations. Recommend phacoemulsification with intra-ocular lens. Recommend Dextenza for post-operative pain and inflammation. Right Eye. Surgery required to correct imbalance of vision. Dilates well - shugarcaine by protocol.  3.  Lashes removed at slit lamp today.

## 2022-05-23 ENCOUNTER — Ambulatory Visit (HOSPITAL_BASED_OUTPATIENT_CLINIC_OR_DEPARTMENT_OTHER): Payer: Medicare Other | Admitting: Anesthesiology

## 2022-05-23 ENCOUNTER — Ambulatory Visit (HOSPITAL_COMMUNITY)
Admission: RE | Admit: 2022-05-23 | Discharge: 2022-05-23 | Disposition: A | Discharge status: Home / Self Care | Payer: Medicare Other | Source: Ambulatory Visit | Attending: Ophthalmology | Admitting: Ophthalmology

## 2022-05-23 ENCOUNTER — Encounter (HOSPITAL_COMMUNITY): Payer: Self-pay | Admitting: Ophthalmology

## 2022-05-23 ENCOUNTER — Ambulatory Visit (HOSPITAL_COMMUNITY): Payer: Medicare Other | Admitting: Anesthesiology

## 2022-05-23 ENCOUNTER — Encounter (HOSPITAL_COMMUNITY)
Admission: RE | Disposition: A | Discharge status: Home / Self Care | Payer: Self-pay | Source: Ambulatory Visit | Attending: Ophthalmology

## 2022-05-23 DIAGNOSIS — J45909 Unspecified asthma, uncomplicated: Secondary | ICD-10-CM

## 2022-05-23 DIAGNOSIS — H25811 Combined forms of age-related cataract, right eye: Secondary | ICD-10-CM | POA: Insufficient documentation

## 2022-05-23 DIAGNOSIS — M1990 Unspecified osteoarthritis, unspecified site: Secondary | ICD-10-CM | POA: Insufficient documentation

## 2022-05-23 DIAGNOSIS — I10 Essential (primary) hypertension: Secondary | ICD-10-CM

## 2022-05-23 DIAGNOSIS — R569 Unspecified convulsions: Secondary | ICD-10-CM | POA: Insufficient documentation

## 2022-05-23 DIAGNOSIS — E1136 Type 2 diabetes mellitus with diabetic cataract: Secondary | ICD-10-CM | POA: Insufficient documentation

## 2022-05-23 DIAGNOSIS — Z7984 Long term (current) use of oral hypoglycemic drugs: Secondary | ICD-10-CM | POA: Insufficient documentation

## 2022-05-23 DIAGNOSIS — Z87891 Personal history of nicotine dependence: Secondary | ICD-10-CM

## 2022-05-23 DIAGNOSIS — E1142 Type 2 diabetes mellitus with diabetic polyneuropathy: Secondary | ICD-10-CM | POA: Insufficient documentation

## 2022-05-23 DIAGNOSIS — Z79899 Other long term (current) drug therapy: Secondary | ICD-10-CM | POA: Diagnosis not present

## 2022-05-23 HISTORY — PX: CATARACT EXTRACTION W/PHACO: SHX586

## 2022-05-23 LAB — GLUCOSE, CAPILLARY: Glucose-Capillary: 108 mg/dL — ABNORMAL HIGH (ref 70–99)

## 2022-05-23 SURGERY — PHACOEMULSIFICATION, CATARACT, WITH IOL INSERTION
Anesthesia: Monitor Anesthesia Care | Site: Eye | Laterality: Right

## 2022-05-23 MED ORDER — SODIUM HYALURONATE 10 MG/ML IO SOLUTION
PREFILLED_SYRINGE | INTRAOCULAR | Status: DC | PRN
  Administered 2022-05-23: 0.85 mL via INTRAOCULAR

## 2022-05-23 MED ORDER — POVIDONE-IODINE 5 % OP SOLN
OPHTHALMIC | Status: DC | PRN
  Administered 2022-05-23: 1 via OPHTHALMIC

## 2022-05-23 MED ORDER — LIDOCAINE HCL (PF) 1 % IJ SOLN
INTRAOCULAR | Status: DC | PRN
  Administered 2022-05-23: 1 mL via OPHTHALMIC

## 2022-05-23 MED ORDER — PHENYLEPHRINE-KETOROLAC 1-0.3 % IO SOLN
INTRAOCULAR | Status: AC
  Filled 2022-05-23: qty 4

## 2022-05-23 MED ORDER — LIDOCAINE HCL 3.5 % OP GEL: 1.0000 | Freq: Once | OPHTHALMIC | Status: DC

## 2022-05-23 MED ORDER — SODIUM CHLORIDE 0.9% FLUSH
INTRAVENOUS | Status: DC | PRN
  Administered 2022-05-23: 5 mL via INTRAVENOUS

## 2022-05-23 MED ORDER — BSS IO SOLN
INTRAOCULAR | Status: DC | PRN
  Administered 2022-05-23: 15 mL via INTRAOCULAR

## 2022-05-23 MED ORDER — MIDAZOLAM HCL 5 MG/5ML IJ SOLN
INTRAMUSCULAR | Status: DC | PRN
  Administered 2022-05-23: 1 mg via INTRAVENOUS

## 2022-05-23 MED ORDER — SODIUM HYALURONATE 23MG/ML IO SOSY
PREFILLED_SYRINGE | INTRAOCULAR | Status: DC | PRN
  Administered 2022-05-23: 0.6 mL via INTRAOCULAR

## 2022-05-23 MED ORDER — MIDAZOLAM HCL 2 MG/2ML IJ SOLN
INTRAMUSCULAR | Status: AC
  Filled 2022-05-23: qty 2

## 2022-05-23 MED ORDER — PHENYLEPHRINE HCL 2.5 % OP SOLN
1.0000 [drp] | OPHTHALMIC | Status: AC | PRN
  Administered 2022-05-23 (×3): 1 [drp] via OPHTHALMIC

## 2022-05-23 MED ORDER — STERILE WATER FOR IRRIGATION IR SOLN
Status: DC | PRN
  Administered 2022-05-23: 250 mL

## 2022-05-23 MED ORDER — TROPICAMIDE 1 % OP SOLN
1.0000 [drp] | OPHTHALMIC | Status: AC | PRN
  Administered 2022-05-23 (×3): 1 [drp] via OPHTHALMIC

## 2022-05-23 MED ORDER — TETRACAINE HCL 0.5 % OP SOLN
1.0000 [drp] | OPHTHALMIC | Status: AC | PRN
  Administered 2022-05-23 (×3): 1 [drp] via OPHTHALMIC

## 2022-05-23 MED ORDER — EPINEPHRINE PF 1 MG/ML IJ SOLN
INTRAMUSCULAR | Status: AC
  Filled 2022-05-23: qty 1

## 2022-05-23 MED ORDER — EPINEPHRINE PF 1 MG/ML IJ SOLN
INTRAOCULAR | Status: DC | PRN
  Administered 2022-05-23: 500 mL

## 2022-05-23 SURGICAL SUPPLY — 17 items
CATARACT SUITE SIGHTPATH (MISCELLANEOUS) ×1 IMPLANT
CLOTH BEACON ORANGE TIMEOUT ST (SAFETY) ×1 IMPLANT
EYE SHIELD UNIVERSAL CLEAR (GAUZE/BANDAGES/DRESSINGS) IMPLANT
FEE CATARACT SUITE SIGHTPATH (MISCELLANEOUS) ×1 IMPLANT
GLOVE BIOGEL PI IND STRL 6.5 (GLOVE) IMPLANT
GLOVE BIOGEL PI IND STRL 7.0 (GLOVE) ×2 IMPLANT
GLOVE BIOGEL PI INDICATOR 6.5 (GLOVE) ×1
GLOVE BIOGEL PI INDICATOR 7.0 (GLOVE) ×1
GLOVE ECLIPSE 6.0 STRL STRAW (GLOVE) IMPLANT
LENS IOL RAYNER 20.0 (Intraocular Lens) ×1 IMPLANT
LENS IOL RAYONE EMV 20.0 (Intraocular Lens) IMPLANT
NDL HYPO 18GX1.5 BLUNT FILL (NEEDLE) IMPLANT
NEEDLE HYPO 18GX1.5 BLUNT FILL (NEEDLE) ×1 IMPLANT
PAD ARMBOARD 7.5X6 YLW CONV (MISCELLANEOUS) ×1 IMPLANT
SYR TB 1ML LL NO SAFETY (SYRINGE) ×1 IMPLANT
TAPE PAPER 1X10 WHT MICROPORE (GAUZE/BANDAGES/DRESSINGS) IMPLANT
WATER STERILE IRR 250ML POUR (IV SOLUTION) ×1 IMPLANT

## 2022-05-23 NOTE — Op Note (Signed)
Date of procedure: 05/23/22  Pre-operative diagnosis:  Visually significant combined form age-related cataract, Right Eye (H25.811)  Post-operative diagnosis:  Visually significant combined form age-related cataract, Right Eye (H25.811)  Procedure: Removal of cataract via phacoemulsification and insertion of intra-ocular lens Rayner RAO200E +20.0D into the capsular bag of the Right Eye  Attending surgeon: Gerda Diss. Latysha Thackston, MD, MA  Anesthesia: MAC, Topical Akten  Complications: None  Estimated Blood Loss: <101m (minimal)  Specimens: None  Implants: As above  Indications:  Visually significant age-related cataract, Right Eye  Procedure:  The patient was seen and identified in the pre-operative area. The operative eye was identified and dilated.  The operative eye was marked.  Topical anesthesia was administered to the operative eye.     The patient was then to the operative suite and placed in the supine position.  A timeout was performed confirming the patient, procedure to be performed, and all other relevant information.   The patient's face was prepped and draped in the usual fashion for intra-ocular surgery.  A lid speculum was placed into the operative eye and the surgical microscope moved into place and focused.  A superotemporal paracentesis was created using a 20 gauge paracentesis blade.  Shugarcaine was injected into the anterior chamber.  Viscoelastic was injected into the anterior chamber.  A temporal clear-corneal main wound incision was created using a 2.44mmicrokeratome.  A continuous curvilinear capsulorrhexis was initiated using an irrigating cystitome and completed using capsulorrhexis forceps.  Hydrodissection and hydrodeliniation were performed.  Viscoelastic was injected into the anterior chamber.  A phacoemulsification handpiece and a chopper as a second instrument were used to remove the nucleus and epinucleus. The irrigation/aspiration handpiece was used to remove any  remaining cortical material.   The capsular bag was reinflated with viscoelastic, checked, and found to be intact.  The intraocular lens was inserted into the capsular bag.  The irrigation/aspiration handpiece was used to remove any remaining viscoelastic.  The clear corneal wound and paracentesis wounds were then hydrated and checked with Weck-Cels to be watertight.  The lid-speculum was removed.  The drape was removed.  The patient's face was cleaned with a wet and dry 4x4.  A clear shield was taped over the eye. The patient was taken to the post-operative care unit in good condition, having tolerated the procedure well.  Post-Op Instructions: [facility] and will receive all other orders and instructions.

## 2022-05-23 NOTE — Interval H&P Note (Signed)
History and Physical Interval Note:  05/23/2022 8:57 AM  Cindy Norris  has presented today for surgery, with the diagnosis of combined forms age related cataract; right.  The various methods of treatment have been discussed with the patient and family. After consideration of risks, benefits and other options for treatment, the patient has consented to  Procedure(s) with comments: CATARACT EXTRACTION PHACO AND INTRAOCULAR LENS PLACEMENT (IOC) (Right) - right as a surgical intervention.  The patient's history has been reviewed, patient examined, no change in status, stable for surgery.  I have reviewed the patient's chart and labs.  Questions were answered to the patient's satisfaction.     Fabio Pierce

## 2022-05-23 NOTE — Anesthesia Preprocedure Evaluation (Signed)
Anesthesia Evaluation  Patient identified by MRN, date of birth, ID band Patient awake    Reviewed: Allergy & Precautions, NPO status , Patient's Chart, lab work & pertinent test results  Airway Mallampati: II  TM Distance: >3 FB Neck ROM: Full    Dental  (+) Edentulous Upper, Edentulous Lower   Pulmonary neg pulmonary ROS, asthma , former smoker,    Pulmonary exam normal breath sounds clear to auscultation       Cardiovascular Exercise Tolerance: Poor hypertension, Pt. on medications negative cardio ROS Normal cardiovascular exam Rhythm:Regular Rate:Normal  Verta Ellen., NP  04/25/2021 7:25 PM EDT   Please call the patient and let her know the echocardiogram showed she has good pumping function of her heart. The main pumping chamber is a little stiff and has problems relaxing. No leaking valves noted.   Verta Ellen, NP  04/25/2021 7:25 PM      Neuro/Psych Seizures -, Well Controlled,  Depression negative neurological ROS  negative psych ROS   GI/Hepatic negative GI ROS, Neg liver ROS, Medicated and Controlled,  Endo/Other  negative endocrine ROSdiabetes, Well Controlled, Type 2, Oral Hypoglycemic Agents  Renal/GU negative Renal ROS  negative genitourinary   Musculoskeletal negative musculoskeletal ROS (+) Osteoarthritis,    Abdominal   Peds negative pediatric ROS (+)  Hematology negative hematology ROS (+)   Anesthesia Other Findings   Reproductive/Obstetrics negative OB ROS                             Anesthesia Physical  Anesthesia Plan  ASA: 3  Anesthesia Plan: MAC   Post-op Pain Management: Minimal or no pain anticipated   Induction: Intravenous  PONV Risk Score and Plan:   Airway Management Planned:   Additional Equipment:   Intra-op Plan:   Post-operative Plan:   Informed Consent: I have reviewed the patients History and Physical, chart, labs  and discussed the procedure including the risks, benefits and alternatives for the proposed anesthesia with the patient or authorized representative who has indicated his/her understanding and acceptance.       Plan Discussed with: CRNA and Surgeon  Anesthesia Plan Comments:         Anesthesia Quick Evaluation

## 2022-05-23 NOTE — Transfer of Care (Signed)
Immediate Anesthesia Transfer of Care Note  Patient: Cindy Norris  Procedure(s) Performed: CATARACT EXTRACTION PHACO AND INTRAOCULAR LENS PLACEMENT (IOC) (Right: Eye)  Patient Location: Short Stay  Anesthesia Type:MAC  Level of Consciousness: awake  Airway & Oxygen Therapy: Patient Spontanous Breathing  Post-op Assessment: Report given to RN  Post vital signs: Reviewed and stable  Last Vitals:  Vitals Value Taken Time  BP 125/69 05/23/22 0922  Temp 36.5 C 05/23/22 0922  Pulse 73 05/23/22 0922  Resp 20 05/23/22 0922  SpO2 96 % 05/23/22 0922    Last Pain:  Vitals:   05/23/22 0922  TempSrc: Oral  PainSc: 0-No pain      Patients Stated Pain Goal: 8 (28/20/81 3887)  Complications: No notable events documented.

## 2022-05-23 NOTE — Discharge Instructions (Addendum)
Please discharge patient when stable, will follow up today with Dr. Ayano Douthitt at the [facility] [place] office immediately following discharge.  Leave shield in place until visit.  All paperwork with discharge instructions will be given at the office.  [facility] Address:  [address]  [place], [place] [address]  

## 2022-05-23 NOTE — Anesthesia Postprocedure Evaluation (Signed)
Anesthesia Post Note  Patient: Cindy Norris  Procedure(s) Performed: CATARACT EXTRACTION PHACO AND INTRAOCULAR LENS PLACEMENT (IOC) (Right: Eye)  Patient location during evaluation: Short Stay Anesthesia Type: MAC Level of consciousness: awake and alert Pain management: pain level controlled Vital Signs Assessment: post-procedure vital signs reviewed and stable Respiratory status: spontaneous breathing Cardiovascular status: blood pressure returned to baseline Postop Assessment: no apparent nausea or vomiting Anesthetic complications: no   No notable events documented.   Last Vitals:  Vitals:   05/23/22 0842 05/23/22 0922  BP: 138/74 125/69  Pulse: 70 73  Resp: 20 20  Temp: 36.6 C 36.5 C  SpO2: 99% 96%    Last Pain:  Vitals:   05/23/22 0922  TempSrc: Oral  PainSc: 0-No pain                 Marland Reine

## 2022-05-26 ENCOUNTER — Encounter (HOSPITAL_COMMUNITY): Payer: Self-pay | Admitting: Ophthalmology

## 2022-09-27 DIAGNOSIS — M25561 Pain in right knee: Secondary | ICD-10-CM | POA: Diagnosis not present

## 2022-09-27 DIAGNOSIS — R2681 Unsteadiness on feet: Secondary | ICD-10-CM | POA: Diagnosis not present

## 2022-09-27 DIAGNOSIS — Z96651 Presence of right artificial knee joint: Secondary | ICD-10-CM | POA: Diagnosis not present

## 2022-09-29 DIAGNOSIS — M25561 Pain in right knee: Secondary | ICD-10-CM | POA: Diagnosis not present

## 2022-09-29 DIAGNOSIS — R2681 Unsteadiness on feet: Secondary | ICD-10-CM | POA: Diagnosis not present

## 2022-09-29 DIAGNOSIS — Z96651 Presence of right artificial knee joint: Secondary | ICD-10-CM | POA: Diagnosis not present

## 2022-10-03 ENCOUNTER — Emergency Department (HOSPITAL_COMMUNITY)
Admission: EM | Admit: 2022-10-03 | Discharge: 2022-10-03 | Disposition: A | Discharge status: Home / Self Care | Payer: 59

## 2022-10-03 DIAGNOSIS — N61 Mastitis without abscess: Secondary | ICD-10-CM | POA: Diagnosis not present

## 2022-10-03 NOTE — ED Notes (Signed)
Pt left per registration 

## 2022-10-04 DIAGNOSIS — R109 Unspecified abdominal pain: Secondary | ICD-10-CM | POA: Diagnosis not present

## 2022-10-04 DIAGNOSIS — R319 Hematuria, unspecified: Secondary | ICD-10-CM | POA: Diagnosis not present

## 2022-10-12 DIAGNOSIS — M533 Sacrococcygeal disorders, not elsewhere classified: Secondary | ICD-10-CM | POA: Diagnosis not present

## 2022-10-12 DIAGNOSIS — G8929 Other chronic pain: Secondary | ICD-10-CM | POA: Diagnosis not present

## 2022-10-17 DIAGNOSIS — N1330 Unspecified hydronephrosis: Secondary | ICD-10-CM | POA: Diagnosis not present

## 2022-10-17 DIAGNOSIS — R319 Hematuria, unspecified: Secondary | ICD-10-CM | POA: Diagnosis not present

## 2022-10-17 DIAGNOSIS — I700 Atherosclerosis of aorta: Secondary | ICD-10-CM | POA: Diagnosis not present

## 2022-10-17 DIAGNOSIS — R109 Unspecified abdominal pain: Secondary | ICD-10-CM | POA: Diagnosis not present

## 2022-10-19 DIAGNOSIS — Z96651 Presence of right artificial knee joint: Secondary | ICD-10-CM | POA: Diagnosis not present

## 2022-10-19 DIAGNOSIS — M2351 Chronic instability of knee, right knee: Secondary | ICD-10-CM | POA: Diagnosis not present

## 2022-10-19 DIAGNOSIS — M533 Sacrococcygeal disorders, not elsewhere classified: Secondary | ICD-10-CM | POA: Diagnosis not present

## 2022-10-19 DIAGNOSIS — M25461 Effusion, right knee: Secondary | ICD-10-CM | POA: Diagnosis not present

## 2022-10-19 DIAGNOSIS — M5137 Other intervertebral disc degeneration, lumbosacral region: Secondary | ICD-10-CM | POA: Diagnosis not present

## 2022-10-19 DIAGNOSIS — G894 Chronic pain syndrome: Secondary | ICD-10-CM | POA: Diagnosis not present

## 2022-10-28 DIAGNOSIS — Z1329 Encounter for screening for other suspected endocrine disorder: Secondary | ICD-10-CM | POA: Diagnosis not present

## 2022-10-28 DIAGNOSIS — E7849 Other hyperlipidemia: Secondary | ICD-10-CM | POA: Diagnosis not present

## 2022-10-28 DIAGNOSIS — I10 Essential (primary) hypertension: Secondary | ICD-10-CM | POA: Diagnosis not present

## 2022-10-28 DIAGNOSIS — E1165 Type 2 diabetes mellitus with hyperglycemia: Secondary | ICD-10-CM | POA: Diagnosis not present

## 2022-10-28 DIAGNOSIS — K219 Gastro-esophageal reflux disease without esophagitis: Secondary | ICD-10-CM | POA: Diagnosis not present

## 2022-11-02 DIAGNOSIS — H5213 Myopia, bilateral: Secondary | ICD-10-CM | POA: Diagnosis not present

## 2022-11-03 DIAGNOSIS — R030 Elevated blood-pressure reading, without diagnosis of hypertension: Secondary | ICD-10-CM | POA: Diagnosis not present

## 2022-11-03 DIAGNOSIS — M545 Low back pain, unspecified: Secondary | ICD-10-CM | POA: Diagnosis not present

## 2022-11-03 DIAGNOSIS — E1165 Type 2 diabetes mellitus with hyperglycemia: Secondary | ICD-10-CM | POA: Diagnosis not present

## 2022-11-03 DIAGNOSIS — K219 Gastro-esophageal reflux disease without esophagitis: Secondary | ICD-10-CM | POA: Diagnosis not present

## 2022-11-03 DIAGNOSIS — Z1389 Encounter for screening for other disorder: Secondary | ICD-10-CM | POA: Diagnosis not present

## 2022-11-08 ENCOUNTER — Other Ambulatory Visit: Payer: Self-pay

## 2022-11-08 ENCOUNTER — Encounter: Payer: Self-pay | Admitting: Urology

## 2022-11-08 ENCOUNTER — Ambulatory Visit (INDEPENDENT_AMBULATORY_CARE_PROVIDER_SITE_OTHER): Payer: 59 | Admitting: Urology

## 2022-11-08 ENCOUNTER — Ambulatory Visit (HOSPITAL_COMMUNITY)
Admission: RE | Admit: 2022-11-08 | Discharge: 2022-11-08 | Disposition: A | Discharge status: Home / Self Care | Payer: 59 | Source: Ambulatory Visit | Attending: Urology | Admitting: Urology

## 2022-11-08 VITALS — BP 123/70 | HR 111

## 2022-11-08 DIAGNOSIS — N200 Calculus of kidney: Secondary | ICD-10-CM | POA: Insufficient documentation

## 2022-11-08 DIAGNOSIS — K6389 Other specified diseases of intestine: Secondary | ICD-10-CM | POA: Diagnosis not present

## 2022-11-08 MED ORDER — HYDROCODONE-ACETAMINOPHEN 7.5-325 MG PO TABS: 1.0000 | ORAL_TABLET | Freq: Two times a day (BID) | ORAL | 0 refills | Status: DC | PRN

## 2022-11-08 NOTE — Addendum Note (Signed)
Addended by: Cleon Gustin on: 11/08/2022 12:31 PM   Modules accepted: Orders

## 2022-11-08 NOTE — Patient Instructions (Signed)
Percutaneous Nephrolithotomy Percutaneous nephrolithotomy is a procedure to remove kidney stones. Kidney stones are deposits that form inside your kidneys and can cause pain. You may need this procedure if: You have large kidney stones. Kidney stones that are bigger than 2 cm (0.78 inch) wide may require this procedure. Your kidney stones are oddly shaped. Other treatments have not been successful in helping the kidney stones to pass. You have developed an infection due to the kidney stones. Tell a health care provider about: Any allergies you have. All medicines you are taking, including vitamins, herbs, eye drops, creams, and over-the-counter medicines. Any problems you or family members have had with anesthetic medicines. Any blood disorders you have. Any surgeries you have had. Any medical conditions you have. Whether you are pregnant or may be pregnant. Whether you use any tobacco products, including cigarettes, chewing tobacco, or e-cigarettes. What are the risks? Generally, this is a safe procedure. However, problems may occur, including: Infection. Bleeding. This may include blood in your urine. Allergic reactions to medicines. Damage to other structures or organs. Kidney damage. Holes in the kidney. These often heal on their own. Numbness or tingling in the affected area. Inability to remove all the stones. You may need a different procedure to complete treatment. What happens before the procedure? Staying hydrated Follow instructions from your health care provider about hydration, which may include: Up to 2 hours before the procedure - you may continue to drink clear liquids, such as water, clear fruit juice, black coffee, and plain tea.  Eating and drinking restrictions Follow instructions from your health care provider about eating and drinking, which may include: 8 hours before the procedure - stop eating heavy meals or foods, such as meat, fried foods, or fatty foods. 6  hours before the procedure - stop eating light meals or foods, such as toast or cereal. 6 hours before the procedure - stop drinking milk or drinks that contain milk. 2 hours before the procedure - stop drinking clear liquids. Medicines Ask your health care provider about: Changing or stopping your regular medicines. This is especially important if you are taking diabetes medicines or blood thinners. Taking medicines such as aspirin and ibuprofen. These medicines can thin your blood. Do not take these medicines unless your health care provider tells you to take them. Taking over-the-counter medicines, vitamins, herbs, and supplements. Tests You may have tests, including: Blood tests. Urine tests. Tests to check how your heart is working. Imaging studies. These are used to identify: The size and number (stone burden) of the kidney stones. The position of the kidney stones. General instructions Plan to have someone take you home from the hospital or clinic. Plan to have a responsible adult care for you for at least 24 hours after you leave the hospital or clinic. This is important. Ask your health care provider how your surgical site will be marked or identified. Ask your health care provider what steps will be taken to help prevent infection. These may include: Removing hair at the surgery site. Washing skin with a germ-killing soap. Taking antibiotic medicine. What happens during the procedure?  An IV will be inserted into one of your veins. The site of the procedure will be marked. You will be given one or more of the following: A medicine to help you relax (sedative). A medicine to numb the area (local anesthetic). A medicine to make you fall asleep (general anesthetic). A medicine that is injected into your spine to numb the area below   and slightly above the injection site (spinal anesthetic). A medicine that is injected into an area of your body to numb everything below the  injection site (regional anesthetic). A thin tube (urinary catheter) will be put in your bladder to drain urine during and after the procedure. Your surgeon will make a small cut (incision) in your lower back. A tube will be inserted through the incision into your kidney. Each kidney stone will be removed through this tube. Larger stones may need to be broken up with a high-intensity light beam (laser) or other tools. After all of the stones have been removed, your health care provider may put in tubes to drain your bladder. Based on your condition: An internal tube, called a stent, may be put in your ureter. This will help drain urine from your kidney to your bladder. A surgical drain (nephrostomy tube) may be put in your kidney. The tube comes out through the incision in your lower back. This will help to drain urine or any fluid that builds up while your kidney heals. Part of the incision may be closed with stitches (sutures). A bandage (dressing) will be placed over the incision area. The procedure may vary among health care providers and hospitals. What happens after the procedure? Your blood pressure, heart rate, breathing rate, and blood oxygen level will be monitored until you leave the hospital or clinic. You may be given medicine for pain. You will be shown how to do breathing exercises, such as coughing and breathing deeply. These will help to prevent pneumonia. You will be encouraged to walk. Walking helps to prevent blood clots. Your stent and urinary catheter will be removed after 1-2 days if there is only a small amount of blood in your urine. You will be taught how to care for the catheter or nephrostomy tube, if you have them. Do not drive for 24 hours if you were given a sedative during your procedure. Summary Percutaneous nephrolithotomy is a procedure to remove kidney stones. Ask your health care provider about changing or stopping your regular medicines. Before surgery,  follow instructions from your health care provider about eating and drinking. Plan to have someone take you home from the hospital or clinic. This information is not intended to replace advice given to you by your health care provider. Make sure you discuss any questions you have with your health care provider. Document Revised: 05/16/2021 Document Reviewed: 05/17/2021 Elsevier Patient Education  2023 Elsevier Inc.  

## 2022-11-08 NOTE — Progress Notes (Signed)
11/08/2022 12:23 PM   Cassie Freer 22-Nov-1955 [id number]  Referring provider: Lanelle Bal, PA-C [place],  [place] [id number]  nephrolithiasis   HPI: Ms Chisman is a 67yo here for evaluation of nephrolithiasis 1 month ago she developed bilateral flank pain and underwent CT which showed a right staghorn calculus. She had 1 stone event 1-2 years ago and does not recall passing the stone.    PMH: Past Medical History:  Diagnosis Date   Asthma    Chronic pain syndrome    Depression    Diabetes mellitus    GERD (gastroesophageal reflux disease)    Hyperlipidemia, mixed    Migraines    Narcolepsy    Osteoarthritis    Overweight(278.02)    Obesity   Seizures ([place])    unknown etiology-on meds-last seizure was several years ago   Unspecified essential hypertension     Surgical History: Past Surgical History:  Procedure Laterality Date   BREAST MASS EXCISION Right    CATARACT EXTRACTION W/PHACO Left 05/09/2022   Procedure: CATARACT EXTRACTION PHACO AND INTRAOCULAR LENS PLACEMENT ([place]);  Surgeon: Baruch Goldmann, MD;  Location: [facility];  Service: Ophthalmology;  Laterality: Left;  CDE: 10.22   CATARACT EXTRACTION W/PHACO Right 05/23/2022   Procedure: CATARACT EXTRACTION PHACO AND INTRAOCULAR LENS PLACEMENT ([facility]);  Surgeon: Baruch Goldmann, MD;  Location: [facility];  Service: Ophthalmology;  Laterality: Right;  CDE 7.15   KNEE ARTHROSCOPY     PILONIDAL CYST EXCISION     TOTAL KNEE ARTHROPLASTY Bilateral     Home Medications:  Allergies as of 11/08/2022       Reactions   Dilaudid [hydromorphone Hcl] Palpitations, Rash   Azithromycin Nausea And Vomiting   Other    Dawn dish detergent-RASH   Tape Other (See Comments)   Blister   Erythromycin Nausea Only   Hydromorphone Hcl Palpitations, Rash   Lyrica [pregabalin] Swelling, Rash   Penicillins Nausea Only   Reglan [metoclopramide] Palpitations   Sulfonamide Derivatives Rash        Medication List        Accurate  as of November 08, 2022 12:23 PM. If you have any questions, ask your nurse or doctor.          albuterol 108 (90 Base) MCG/ACT inhaler Commonly known as: VENTOLIN HFA Inhale 1-2 puffs into the lungs as needed for wheezing or shortness of breath.   ALPRAZolam 0.5 MG tablet Commonly known as: XANAX Take by mouth.   aspirin EC 81 MG tablet Take by mouth.   atorvastatin 80 MG tablet Commonly known as: LIPITOR Take 80 mg by mouth at bedtime.   benzonatate 100 MG capsule Commonly known as: TESSALON Take 1 capsule (100 mg total) by mouth every 8 (eight) hours.   busPIRone 10 MG tablet Commonly known as: BUSPAR Take 10 mg by mouth 2 (two) times daily.   cyclobenzaprine 10 MG tablet Commonly known as: FLEXERIL Take 10 mg by mouth in the morning and at bedtime.   Dulaglutide 1.5 MG/0.5ML Sopn Inject 1.5 mg into the skin every Wednesday.   EARACHE DROPS OT Place 2 drops in ear(s) daily as needed (ear pain).   ezetimibe 10 MG tablet Commonly known as: ZETIA   gabapentin 800 MG tablet Commonly known as: NEURONTIN Take 800 mg by mouth 2 (two) times daily.   HYDROcodone-acetaminophen 7.5-325 MG tablet Commonly known as: NORCO Take 1 tablet by mouth 2 (two) times daily as needed for moderate pain.   hydrOXYzine  25 MG tablet Commonly known as: ATARAX Take 25 mg by mouth 2 (two) times daily.   ibuprofen 800 MG tablet Commonly known as: ADVIL Take 800 mg by mouth every 8 (eight) hours as needed for moderate pain.   levETIRAcetam 1000 MG tablet Commonly known as: KEPPRA Take 1,000 mg by mouth 2 (two) times daily.   metFORMIN 500 MG 24 hr tablet Commonly known as: GLUCOPHAGE-XR Take 500 mg by mouth 2 (two) times daily.   methylphenidate 10 MG tablet Commonly known as: RITALIN Take by mouth.   midodrine 10 MG tablet Commonly known as: PROAMATINE Take 10 mg by mouth 2 (two) times daily.   modafinil 200 MG tablet Commonly known as: PROVIGIL Take 400 mg by mouth  every morning.   multivitamin with minerals Tabs tablet Take 1 tablet by mouth daily.   nabumetone 750 MG tablet Commonly known as: RELAFEN Take 750 mg by mouth 2 (two) times daily.   omeprazole 40 MG capsule Commonly known as: PRILOSEC Take 80 mg by mouth at bedtime.   ondansetron 4 MG tablet Commonly known as: ZOFRAN Take 1 tablet (4 mg total) by mouth every 6 (six) hours.   oxybutynin 5 MG tablet Commonly known as: DITROPAN Take 5 mg by mouth 2 (two) times daily.   SUMAtriptan 50 MG tablet Commonly known as: IMITREX Take 50-100 mg by mouth every 2 (two) hours as needed for migraine. May repeat in 2 hours if headache persists or recurs.   SYSTANE OP Place 1 drop into both eyes 2 (two) times daily.   topiramate 100 MG tablet Commonly known as: TOPAMAX Take 100 mg by mouth 2 (two) times daily.   venlafaxine XR 150 MG 24 hr capsule Commonly known as: EFFEXOR-XR Take 300 mg by mouth daily.   zolpidem 5 MG tablet Commonly known as: AMBIEN Take 5 mg by mouth at bedtime as needed for sleep.        Allergies:  Allergies  Allergen Reactions   Dilaudid [Hydromorphone Hcl] Palpitations and Rash   Azithromycin Nausea And Vomiting   Other     Dawn dish detergent-RASH   Tape Other (See Comments)    Blister   Erythromycin Nausea Only   Hydromorphone Hcl Palpitations and Rash   Lyrica [Pregabalin] Swelling and Rash   Penicillins Nausea Only   Reglan [Metoclopramide] Palpitations   Sulfonamide Derivatives Rash    Family History: Family History  Problem Relation Age of Onset   Coronary artery disease Other     Social History:  reports that she quit smoking about 34 years ago. Her smoking use included cigarettes. She has a 2.00 pack-year smoking history. She has never used smokeless tobacco. She reports that she does not drink alcohol and does not use drugs.  ROS: All other review of systems were reviewed and are negative except what is noted above in  HPI  Physical Exam: BP 123/70   Pulse (!) 111   Constitutional:  Alert and oriented, No acute distress. HEENT: [place] AT, moist mucus membranes.  Trachea midline, no masses. Cardiovascular: No clubbing, cyanosis, or edema. Respiratory: Normal respiratory effort, no increased work of breathing. GI: Abdomen is soft, nontender, nondistended, no abdominal masses GU: No CVA tenderness.  Lymph: No cervical or inguinal lymphadenopathy. Skin: No rashes, bruises or suspicious lesions. Neurologic: Grossly intact, no focal deficits, moving all 4 extremities. Psychiatric: Normal mood and affect.  Laboratory Data: Lab Results  Component Value Date   WBC 6.6 05/03/2021   HGB 12.3 05/03/2021  HCT 39.4 05/03/2021   MCV 93.1 05/03/2021   PLT 241 05/03/2021    Lab Results  Component Value Date   CREATININE 0.74 05/03/2021    No results found for: "PSA"  No results found for: "TESTOSTERONE"  Lab Results  Component Value Date   HGBA1C (H) 06/27/2008    7.5 (NOTE)   The ADA recommends the following therapeutic goal for glycemic   control related to Hgb A1C measurement:   Goal of Therapy:   < 7.0% Hgb A1C   Reference: American Diabetes Association: Clinical Practice   Recommendations 2008, Diabetes Care,  2008, 31:(Suppl 1).    Urinalysis    Component Value Date/Time   COLORURINE STRAW (A) 01/29/2020 1508   APPEARANCEUR CLEAR 01/29/2020 1508   LABSPEC 1.011 01/29/2020 1508   PHURINE 6.0 01/29/2020 1508   GLUCOSEU NEGATIVE 01/29/2020 1508   HGBUR MODERATE (A) 01/29/2020 1508   BILIRUBINUR NEGATIVE 01/29/2020 1508   KETONESUR NEGATIVE 01/29/2020 1508   PROTEINUR NEGATIVE 01/29/2020 1508   UROBILINOGEN 2.0 (H) 06/19/2008 1206   NITRITE NEGATIVE 01/29/2020 1508   LEUKOCYTESUR NEGATIVE 01/29/2020 1508    Lab Results  Component Value Date   BACTERIA NONE SEEN 01/29/2020    Pertinent Imaging: CT 10/17/2022 and KUB today: Images reviewed and discussed with the patient No results  found for this or any previous visit.  No results found for this or any previous visit.  No results found for this or any previous visit.  No results found for this or any previous visit.  No results found for this or any previous visit.  No valid procedures specified. No results found for this or any previous visit.  No results found for this or any previous visit.   Assessment & Plan:    1. Kidney stones -We discussed the management of kidney stones. These options include observation, ureteroscopy, shockwave lithotripsy (ESWL) and percutaneous nephrolithotomy (PCNL). We discussed which options are relevant to the patient's stone(s). We discussed the natural history of kidney stones as well as the complications of untreated stones and the impact on quality of life without treatment as well as with each of the above listed treatments. We also discussed the efficacy of each treatment in its ability to clear the stone burden. With any of these management options I discussed the signs and symptoms of infection and the need for emergent treatment should these be experienced. For each option we discussed the ability of each procedure to clear the patient of their stone burden.   For observation I described the risks which include but are not limited to silent renal damage, life-threatening infection, need for emergent surgery, failure to pass stone and pain.   For ureteroscopy I described the risks which include bleeding, infection, damage to contiguous structures, positioning injury, ureteral stricture, ureteral avulsion, ureteral injury, need for prolonged ureteral stent, inability to perform ureteroscopy, need for an interval procedure, inability to clear stone burden, stent discomfort/pain, heart attack, stroke, pulmonary embolus and the inherent risks with general anesthesia.   For shockwave lithotripsy I described the risks which include arrhythmia, kidney contusion, kidney hemorrhage, need  for transfusion, pain, inability to adequately break up stone, inability to pass stone fragments, Steinstrasse, infection associated with obstructing stones, need for alternate surgical procedure, need for repeat shockwave lithotripsy, MI, CVA, PE and the inherent risks with anesthesia/conscious sedation.   For PCNL I described the risks including positioning injury, pneumothorax, hydrothorax, need for chest tube, inability to clear stone burden, renal laceration, arterial  venous fistula or malformation, need for embolization of kidney, loss of kidney or renal function, need for repeat procedure, need for prolonged nephrostomy tube, ureteral avulsion, MI, CVA, PE and the inherent risks of general anesthesia.   - The patient would like to proceed with right PCNL. - Urinalysis, Routine w reflex microscopic   No follow-ups on file.  Nicolette Bang, MD  [facility] [place]

## 2022-11-09 ENCOUNTER — Telehealth: Payer: Self-pay

## 2022-11-09 NOTE — Telephone Encounter (Signed)
Patient called in today and stated pharmacy would not fill current pain medication, so the patient was requesting that the MD write a prescription for a higher dose. I contact pharmacy,  pharmacy staff member made me aware that patient have been receiving medication from numerous provider each month for [place] staff member voiced that patient received a prescription of 60 tablets of Norco a week and half ago from another provider.. MD was made aware and Norco has been D/C. Patient is aware that Norco has been D/C due to previous prescription of Norco 60 tablets from other provider.  Patient voiced that she has been taking more of the pain medication than prescribe due to her kidney stones. Patient voiced understanding.

## 2022-11-14 ENCOUNTER — Telehealth: Payer: Self-pay

## 2022-11-14 DIAGNOSIS — N200 Calculus of kidney: Secondary | ICD-10-CM

## 2022-11-14 NOTE — Telephone Encounter (Signed)
I spoke with Ms Cindy Norris. We have discussed possible surgery dates and 12/22/2022 was agreed upon by all parties. Patient given information about surgery date, what to expect pre-operatively and post operatively.    We discussed that a pre-op nurse will be calling to set up the pre-op visit that will take place prior to surgery. Informed patient that our office will communicate any additional care to be provided after surgery.    Patients questions or concerns were discussed during our call. Advised to call our office should there be any additional information, questions or concerns that arise. Patient verbalized understanding.    Patient understands to hold trulicity- 7 days prior. Patient understands IR appointment to be scheduled for right nephrostomy tube placement

## 2022-11-21 DIAGNOSIS — M129 Arthropathy, unspecified: Secondary | ICD-10-CM | POA: Diagnosis not present

## 2022-11-21 DIAGNOSIS — R350 Frequency of micturition: Secondary | ICD-10-CM | POA: Diagnosis not present

## 2022-11-21 DIAGNOSIS — M797 Fibromyalgia: Secondary | ICD-10-CM | POA: Diagnosis not present

## 2022-11-21 DIAGNOSIS — R0602 Shortness of breath: Secondary | ICD-10-CM | POA: Diagnosis not present

## 2022-11-21 DIAGNOSIS — M25561 Pain in right knee: Secondary | ICD-10-CM | POA: Diagnosis not present

## 2022-11-21 DIAGNOSIS — M25562 Pain in left knee: Secondary | ICD-10-CM | POA: Diagnosis not present

## 2022-11-21 DIAGNOSIS — M47812 Spondylosis without myelopathy or radiculopathy, cervical region: Secondary | ICD-10-CM | POA: Diagnosis not present

## 2022-11-21 DIAGNOSIS — E119 Type 2 diabetes mellitus without complications: Secondary | ICD-10-CM | POA: Diagnosis not present

## 2022-11-21 DIAGNOSIS — Z0000 Encounter for general adult medical examination without abnormal findings: Secondary | ICD-10-CM | POA: Diagnosis not present

## 2022-11-21 DIAGNOSIS — Z1159 Encounter for screening for other viral diseases: Secondary | ICD-10-CM | POA: Diagnosis not present

## 2022-11-21 DIAGNOSIS — M545 Low back pain, unspecified: Secondary | ICD-10-CM | POA: Diagnosis not present

## 2022-11-21 DIAGNOSIS — E559 Vitamin D deficiency, unspecified: Secondary | ICD-10-CM | POA: Diagnosis not present

## 2022-11-21 DIAGNOSIS — Z79899 Other long term (current) drug therapy: Secondary | ICD-10-CM | POA: Diagnosis not present

## 2022-11-24 DIAGNOSIS — Z79899 Other long term (current) drug therapy: Secondary | ICD-10-CM | POA: Diagnosis not present

## 2022-11-28 DIAGNOSIS — M5137 Other intervertebral disc degeneration, lumbosacral region: Secondary | ICD-10-CM | POA: Diagnosis not present

## 2022-11-28 DIAGNOSIS — Z6833 Body mass index (BMI) 33.0-33.9, adult: Secondary | ICD-10-CM | POA: Diagnosis not present

## 2022-11-28 DIAGNOSIS — Z79899 Other long term (current) drug therapy: Secondary | ICD-10-CM | POA: Diagnosis not present

## 2022-11-28 DIAGNOSIS — E119 Type 2 diabetes mellitus without complications: Secondary | ICD-10-CM | POA: Diagnosis not present

## 2022-11-30 DIAGNOSIS — Z79899 Other long term (current) drug therapy: Secondary | ICD-10-CM | POA: Diagnosis not present

## 2022-12-13 DIAGNOSIS — R300 Dysuria: Secondary | ICD-10-CM | POA: Diagnosis not present

## 2022-12-13 DIAGNOSIS — M25511 Pain in right shoulder: Secondary | ICD-10-CM | POA: Diagnosis not present

## 2022-12-13 DIAGNOSIS — Z6833 Body mass index (BMI) 33.0-33.9, adult: Secondary | ICD-10-CM | POA: Diagnosis not present

## 2022-12-13 DIAGNOSIS — R030 Elevated blood-pressure reading, without diagnosis of hypertension: Secondary | ICD-10-CM | POA: Diagnosis not present

## 2022-12-16 ENCOUNTER — Encounter: Payer: Self-pay | Admitting: Internal Medicine

## 2022-12-16 ENCOUNTER — Ambulatory Visit: Payer: Medicare HMO | Attending: Internal Medicine | Admitting: Internal Medicine

## 2022-12-16 VITALS — BP 130/74 | HR 98 | Ht 64.0 in | Wt 197.0 lb

## 2022-12-16 DIAGNOSIS — Z01810 Encounter for preprocedural cardiovascular examination: Secondary | ICD-10-CM

## 2022-12-16 DIAGNOSIS — R002 Palpitations: Secondary | ICD-10-CM | POA: Diagnosis not present

## 2022-12-16 DIAGNOSIS — G4733 Obstructive sleep apnea (adult) (pediatric): Secondary | ICD-10-CM | POA: Diagnosis not present

## 2022-12-16 DIAGNOSIS — R0789 Other chest pain: Secondary | ICD-10-CM | POA: Diagnosis not present

## 2022-12-16 NOTE — Patient Instructions (Signed)
Medication Instructions:  Your physician recommends that you continue on your current medications as directed. Please refer to the Current Medication list given to you today.  *If you need a refill on your cardiac medications before your next appointment, please call your pharmacy*   Lab Work: NONE   If you have labs (blood work) drawn today and your tests are completely normal, you will receive your results only by: MyChart Message (if you have MyChart) OR A paper copy in the mail If you have any lab test that is abnormal or we need to change your treatment, we will call you to review the results.   Testing/Procedures: NONE    Follow-Up: At [facility], you and your health needs are our priority.  As part of our continuing mission to provide you with exceptional heart care, we have created designated Provider Care Teams.  These Care Teams include your primary Cardiologist (physician) and Advanced Practice Providers (APPs -  Physician Assistants and Nurse Practitioners) who all work together to provide you with the care you need, when you need it.  We recommend signing up for the patient portal called "MyChart".  Sign up information is provided on this After Visit Summary.  MyChart is used to connect with patients for Virtual Visits (Telemedicine).  Patients are able to view lab/test results, encounter notes, upcoming appointments, etc.  Non-urgent messages can be sent to your provider as well.   To learn more about what you can do with MyChart, go to [email or web address].    Your next appointment:   2 year(s)  Provider:   Vishnu Mallipeddi, MD    Other Instructions Thank you for choosing [facility]!    

## 2022-12-16 NOTE — Progress Notes (Signed)
Cardiology Office Note  Date: 12/16/2022   ID: Cindy Norris, DOB [birth year], MRN [id number]  PCP:  [facility]  Cardiologist:  Fransico Him, MD Electrophysiologist:  None   Reason for Office Visit: 1 year follow-up   History of Present Illness: Cindy Norris is a 67 y.o. female known to have HLD, OSA not on CPAP presented to cardiology clinic for follow-up visit.  She was initially referred to cardiology clinic for evaluation of chest pain/palpitations. NM stress test in 2022 showed no ischemia, 2D echo showed normal LV function with EF 55 to 60%, 14-day Zio monitor showed rare PACs/PVCs with 3 episodes of SVT lasting about 12 seconds on average with a heart rate of 140 bpm.  She is here for follow-up visit. She continues to have sharp chest pains behind the left side of her breast, occurs at random times, lasting for a few seconds, comes and goes. She also has substernal chest heaviness on random occasions, occurs once in a while. She has SOB at baseline and no recent worsening. She had sleep apnea, lost weight and thought she did not have sleep apnea anymore. Denies dizziness, syncope, palpitations or leg swelling. Her blood pressures were noted to be low when she stands up for which she was started on midodrine.  Past Medical History:  Diagnosis Date   Asthma    Chronic pain syndrome    Depression    Diabetes mellitus    GERD (gastroesophageal reflux disease)    Hyperlipidemia, mixed    Migraines    Narcolepsy    Osteoarthritis    Overweight(278.02)    Obesity   Seizures ([place])    unknown etiology-on meds-last seizure was several years ago   Unspecified essential hypertension     Past Surgical History:  Procedure Laterality Date   BREAST MASS EXCISION Right    CATARACT EXTRACTION W/PHACO Left 05/09/2022   Procedure: CATARACT EXTRACTION PHACO AND INTRAOCULAR LENS PLACEMENT ([place]);  Surgeon: Baruch Goldmann, MD;  Location: [facility];  Service: Ophthalmology;   Laterality: Left;  CDE: 10.22   CATARACT EXTRACTION W/PHACO Right 05/23/2022   Procedure: CATARACT EXTRACTION PHACO AND INTRAOCULAR LENS PLACEMENT ([facility]);  Surgeon: Baruch Goldmann, MD;  Location: [facility];  Service: Ophthalmology;  Laterality: Right;  CDE 7.15   KNEE ARTHROSCOPY     PILONIDAL CYST EXCISION     TOTAL KNEE ARTHROPLASTY Bilateral     Current Outpatient Medications  Medication Sig Dispense Refill   albuterol (VENTOLIN HFA) 108 (90 Base) MCG/ACT inhaler Inhale 1-2 puffs into the lungs as needed for wheezing or shortness of breath.     atorvastatin (LIPITOR) 80 MG tablet Take 80 mg by mouth at bedtime.     busPIRone (BUSPAR) 10 MG tablet Take 10 mg by mouth 2 (two) times daily.     cephALEXin (KEFLEX) 500 MG capsule Take 500-1,000 mg by mouth See admin instructions. Take 1000 mg in the morning and 500 mg at night     Dulaglutide 1.5 MG/0.5ML SOPN Inject 1.5 mg into the skin every Wednesday.     ezetimibe (ZETIA) 10 MG tablet Take 10 mg by mouth daily.     gabapentin (NEURONTIN) 800 MG tablet Take 800 mg by mouth 3 (three) times daily.     HYDROcodone-acetaminophen (NORCO) 7.5-325 MG tablet Take 1 tablet by mouth every 6 (six) hours as needed for moderate pain.     hydrOXYzine (ATARAX/VISTARIL) 25 MG tablet Take 25 mg by mouth 2 (two) times daily.  ibuprofen (ADVIL,MOTRIN) 800 MG tablet Take 800 mg by mouth every 8 (eight) hours as needed for moderate pain.     levETIRAcetam (KEPPRA) 1000 MG tablet Take 1,000 mg by mouth 2 (two) times daily.     lidocaine (LIDODERM) 5 % Place 1 patch onto the skin daily as needed (pain). Remove & Discard patch within 12 hours or as directed by MD     metFORMIN (GLUCOPHAGE-XR) 500 MG 24 hr tablet Take 500 mg by mouth 2 (two) times daily.     midodrine (PROAMATINE) 10 MG tablet Take 15 mg by mouth 3 (three) times daily.     modafinil (PROVIGIL) 200 MG tablet Take 400 mg by mouth every morning.      Multiple Vitamin (MULTIVITAMIN WITH MINERALS) TABS  tablet Take 1 tablet by mouth daily.     nabumetone (RELAFEN) 750 MG tablet Take 750 mg by mouth 2 (two) times daily.     omeprazole (PRILOSEC) 40 MG capsule Take 80 mg by mouth at bedtime.     oxybutynin (DITROPAN) 5 MG tablet Take 5 mg by mouth 3 (three) times daily.     Polyethyl Glycol-Propyl Glycol (SYSTANE OP) Place 1 drop into both eyes 2 (two) times daily.     SUMAtriptan (IMITREX) 50 MG tablet Take 50-100 mg by mouth every 2 (two) hours as needed for migraine. May repeat in 2 hours if headache persists or recurs.     topiramate (TOPAMAX) 100 MG tablet Take 100 mg by mouth 2 (two) times daily.     venlafaxine XR (EFFEXOR-XR) 150 MG 24 hr capsule Take 300 mg by mouth daily.     No current facility-administered medications for this visit.   Allergies:  Dilaudid [hydromorphone hcl], Azithromycin, Other, Tape, Erythromycin, Lyrica [pregabalin], Penicillins, Reglan [metoclopramide], and Sulfonamide derivatives   Social History: The patient  reports that she quit smoking about 34 years ago. Her smoking use included cigarettes. She has a 2.00 pack-year smoking history. She has never used smokeless tobacco. She reports that she does not drink alcohol and does not use drugs.   Family History: The patient's family history includes Coronary artery disease in an other family member.   ROS:  Please see the history of present illness. Otherwise, complete review of systems is positive for none.  All other systems are reviewed and negative.   Physical Exam: VS:  BP 130/74   Pulse 98   Ht 5\' 4"  (1.626 m)   Wt 197 lb (89.4 kg)   SpO2 95%   BMI 33.81 kg/m , BMI Body mass index is 33.81 kg/m.  Wt Readings from Last 3 Encounters:  12/16/22 197 lb (89.4 kg)  05/23/22 220 lb (99.8 kg)  05/17/22 193 lb 2 oz (87.6 kg)    General: Patient appears comfortable at rest. HEENT: Conjunctiva and lids normal, oropharynx clear with moist mucosa. Neck: Supple, no elevated JVP or carotid bruits, no  thyromegaly. Lungs: Clear to auscultation, nonlabored breathing at rest. Cardiac: Regular rate and rhythm, no S3 or significant systolic murmur, no pericardial rub. Abdomen: Soft, nontender, no hepatomegaly, bowel sounds present, no guarding or rebound. Extremities: No pitting edema, distal pulses 2+. Skin: Warm and dry. Musculoskeletal: No kyphosis. Neuropsychiatric: Alert and oriented x3, affect grossly appropriate.  ECG: NSR, nonspecific T wave changes in the anterolateral leads  Recent Labwork: No results found for requested labs within last 365 days.  No results found for: "CHOL", "TRIG", "HDL", "CHOLHDL", "VLDL", "LDLCALC", "LDLDIRECT"  Other Studies Reviewed Today:  Assessment and Plan: Patient is a 67 year old F known to have HLD, OSA not on CPAP presented to the cardiology clinic for follow-up visit.  # Atypical chest pain -Patient has noncardiac chest pain (pain behind left side of her breast) and possibly cardiac chest pain (substernal chest heaviness occurring once in a while). NM stress test in 2022 showed no evidence of ischemia. Will monitor for any progression of anginal symptoms and repeat NM stress test in the future.  # Preoperative cardiac risk stratification for percutaneous nephrolithotomy -Patient has atypical chest pain and no worsening of SOB. EKG shows normal sinus rhythm and nonspecific T wave changes in the anterolateral leads. NM stress test from 2022 showed no evidence of ischemia. She is at a low to intermediate risk for any perioperative cardiac complications. No further cardiac testing is indicated prior to proceeding with the planned procedure.  # HLD -Continue atorvastatin 80 mg nightly and ezetimibe 10 mg once daily.  Goal LDL less than 100. Follow-up with PCP for HLD management.  # OSA not on CPAP -Patient was diagnosed with OSA in the past however she lost weight recently and thought she does not have sleep apnea. Referral to sleep medicine for  OSA management.  I have spent a total of 30 minutes with patient reviewing chart, EKGs, labs and examining patient as well as establishing an assessment and plan that was discussed with the patient.  > 50% of time was spent in direct patient care.     Medication Adjustments/Labs and Tests Ordered: Current medicines are reviewed at length with the patient today.  Concerns regarding medicines are outlined above.   Tests Ordered: No orders of the defined types were placed in this encounter.   Medication Changes: No orders of the defined types were placed in this encounter.   Disposition:  Follow up  2 years  Signed, Lenny Fiumara Fidel Levy, MD, 12/16/2022 2:39 PM    [place] [facility] [address], [place], [place] [address]

## 2022-12-19 NOTE — Patient Instructions (Signed)
Cindy Norris  12/19/2022     @PREFPERIOPPHARMACY @   Your procedure is scheduled on  12/22/2022.   Report to [facility] at  0600 A.M.   Call this number if you have problems the morning of surgery:  [phone]  If you experience any cold or flu symptoms such as cough, fever, chills, shortness of breath, etc. between now and your scheduled surgery, please notify us at the above number.   Remember:  Do not eat or drink after midnight.      Your last dose of dulaglutide shoule de on 12/14/2022.      Use your inhaler before you come and briong your rescue inhaler with you.     Take these medicines the morning of surgery with A SIP OF WATER          buspar, gabapentinm hydrocodone(if needed), hydroxyzine(if needed), keppra, provigil, relafe(if needed), omeprazole, imitrx(if needed), topamax, effexor.      Do not wear jewelry, make-up or nail polish.  Do not wear lotions, powders, or perfumes, or deodorant.  Do not shave 48 hours prior to surgery.  Men may shave face and neck.  Do not bring valuables to the hospital.  [facility] is not responsible for any belongings or valuables.  Contacts, dentures or bridgework may not be worn into surgery.  Leave your suitcase in the car.  After surgery it may be brought to your room.  For patients admitted to the hospital, discharge time will be determined by your treatment team.  Patients discharged the day of surgery will not be allowed to drive home and must have someone with them for 24 hours.     Special instructions:   DO NOT smoke tobacco or vape for 24 hours before your procedure.  Please read over the following fact sheets that you were given. Coughing and Deep Breathing, Surgical Site Infection Prevention, Anesthesia Post-op Instructions, and Care and Recovery After Surgery      Percutaneous Nephrolithotomy, Care After After percutaneous nephrolithotomy, it is common to have: Soreness or pain. A small  amount of blood or clear fluid coming from your incision for a few days. Tiredness (fatigue). Some blood in your pee (urine). This will last for a few days. A feeling of needing to pee (urinate) often. It may feel urgent. You may have this if you have a small mesh tube (stent) in the part of your body that connects your bladder to your kidneys (ureter). Follow these instructions at home: Medicines Take over-the-counter and prescription medicines only as told by your health care provider. If you were prescribed antibiotics, take them as told by your provider. Do not stop using the antibiotic even if you start to feel better. Ask your provider if the medicine prescribed to you: Requires you to avoid driving or using machinery. Can cause constipation. You may need to take these actions to prevent or treat constipation: Drink enough fluid to keep your pee pale yellow. Take over-the-counter or prescription medicines. Eat foods that are high in fiber, such as beans, whole grains, and fresh fruits and vegetables. Limit foods that are high in fat and processed sugars, such as fried or sweet foods. Incision care  Follow instructions from your provider about how to take care of your incision. Make sure you: Wash your hands with soap and water for at least 20 seconds before and after you change your bandage (dressing). If soap and water are not available, use  hand sanitizer. Change your dressing as told by your provider. Leave stitches (sutures), skin glue, or tape strips in place. These skin closures may need to stay in place for 2 weeks or longer. If tape strip edges start to loosen and curl up, you may trim the loose edges. Do not remove tape strips completely unless your provider tells you to do that. Check your incision area every day for signs of infection. Check for: More redness, swelling, or pain. More fluid or blood. Warmth. Pus or a bad smell. Do not take baths, swim, or use a hot tub until  your provider approves. Ask your provider if you may take showers. You may only be allowed to take sponge baths. Activity Avoid activities that take a lot of effort for as long as told by your provider. Return to your normal activities as told by your provider. Ask your provider what activities are safe for you. General instructions If you were sent home with a soft tube (catheter) or a surgical drain (nephrostomy tube), follow your provider's instructions on how to take care of it. Wear compression stockings as told by your provider. These stockings help to prevent blood clots and reduce swelling in your legs. Do not use any products that contain nicotine or tobacco. These products include cigarettes, chewing tobacco, and vaping devices, such as e-cigarettes. These can delay incision healing after surgery. If you need help quitting, ask your provider. Keep all follow-up visits. If you have a stent, it will need to be removed after 1-2 weeks. Your provider may give you more instructions. Make sure you know what you can and cannot do. Contact a health care provider if: You have a fever. Your incision shows any signs of infection. You lose your appetite, feel nauseous, or vomit. You have a catheter, stent, or drain and the flow of pee stops all of a sudden. Then you have pain in your kidney. Get help right away if: There is a lot of blood in your pee. You have blood clots in your pee. You cannot pee. You have chest pain or trouble breathing. These symptoms may be an emergency. Get help right away. Call 911. Do not wait to see if the symptoms will go away. Do not drive yourself to the hospital. This information is not intended to replace advice given to you by your health care provider. Make sure you discuss any questions you have with your health care provider. Document Revised: 05/12/2022 Document Reviewed: 05/12/2022 Elsevier Patient Education  [place] Anesthesia, Adult,  Care After The following information offers guidance on how to care for yourself after your procedure. Your health care provider may also give you more specific instructions. If you have problems or questions, contact your health care provider. What can I expect after the procedure? After the procedure, it is common for people to: Have pain or discomfort at the IV site. Have nausea or vomiting. Have a sore throat or hoarseness. Have trouble concentrating. Feel cold or chills. Feel weak, sleepy, or tired (fatigue). Have soreness and body aches. These can affect parts of the body that were not involved in surgery. Follow these instructions at home: For the time period you were told by your health care provider:  Rest. Do not participate in activities where you could fall or become injured. Do not drive or use machinery. Do not drink alcohol. Do not take sleeping pills or medicines that cause drowsiness. Do not make important decisions or sign legal documents.  Do not take care of children on your own. General instructions Drink enough fluid to keep your urine pale yellow. If you have sleep apnea, surgery and certain medicines can increase your risk for breathing problems. Follow instructions from your health care provider about wearing your sleep device: Anytime you are sleeping, including during daytime naps. While taking prescription pain medicines, sleeping medicines, or medicines that make you drowsy. Return to your normal activities as told by your health care provider. Ask your health care provider what activities are safe for you. Take over-the-counter and prescription medicines only as told by your health care provider. Do not use any products that contain nicotine or tobacco. These products include cigarettes, chewing tobacco, and vaping devices, such as e-cigarettes. These can delay incision healing after surgery. If you need help quitting, ask your health care provider. Contact a  health care provider if: You have nausea or vomiting that does not get better with medicine. You vomit every time you eat or drink. You have pain that does not get better with medicine. You cannot urinate or have bloody urine. You develop a skin rash. You have a fever. Get help right away if: You have trouble breathing. You have chest pain. You vomit blood. These symptoms may be an emergency. Get help right away. Call 911. Do not wait to see if the symptoms will go away. Do not drive yourself to the hospital. Summary After the procedure, it is common to have a sore throat, hoarseness, nausea, vomiting, or to feel weak, sleepy, or fatigue. For the time period you were told by your health care provider, do not drive or use machinery. Get help right away if you have difficulty breathing, have chest pain, or vomit blood. These symptoms may be an emergency. This information is not intended to replace advice given to you by your health care provider. Make sure you discuss any questions you have with your health care provider. Document Revised: 12/10/2021 Document Reviewed: 12/10/2021 Elsevier Patient Education  [place]. How to Use Chlorhexidine Before Surgery Chlorhexidine gluconate (CHG) is a germ-killing (antiseptic) solution that is used to clean the skin. It can get rid of the bacteria that normally live on the skin and can keep them away for about 24 hours. To clean your skin with CHG, you may be given: A CHG solution to use in the shower or as part of a sponge bath. A prepackaged cloth that contains CHG. Cleaning your skin with CHG may help lower the risk for infection: While you are staying in the intensive care unit of the hospital. If you have a vascular access, such as a central line, to provide short-term or long-term access to your veins. If you have a catheter to drain urine from your bladder. If you are on a ventilator. A ventilator is a machine that helps you breathe  by moving air in and out of your lungs. After surgery. What are the risks? Risks of using CHG include: A skin reaction. Hearing loss, if CHG gets in your ears and you have a perforated eardrum. Eye injury, if CHG gets in your eyes and is not rinsed out. The CHG product catching fire. Make sure that you avoid smoking and flames after applying CHG to your skin. Do not use CHG: If you have a chlorhexidine allergy or have previously reacted to chlorhexidine. On babies younger than 19 months of age. How to use CHG solution Use CHG only as told by your health care provider, and  follow the instructions on the label. Use the full amount of CHG as directed. Usually, this is one bottle. During a shower Follow these steps when using CHG solution during a shower (unless your health care provider gives you different instructions): Start the shower. Use your normal soap and shampoo to wash your face and hair. Turn off the shower or move out of the shower stream. Pour the CHG onto a clean washcloth. Do not use any type of brush or rough-edged sponge. Starting at your neck, lather your body down to your toes. Make sure you follow these instructions: If you will be having surgery, pay special attention to the part of your body where you will be having surgery. Scrub this area for at least 1 minute. Do not use CHG on your head or face. If the solution gets into your ears or eyes, rinse them well with water. Avoid your genital area. Avoid any areas of skin that have broken skin, cuts, or scrapes. Scrub your back and under your arms. Make sure to wash skin folds. Let the lather sit on your skin for 1-2 minutes or as long as told by your health care provider. Thoroughly rinse your entire body in the shower. Make sure that all body creases and crevices are rinsed well. Dry off with a clean towel. Do not put any substances on your body afterward--such as powder, lotion, or perfume--unless you are told to do so  by your health care provider. Only use lotions that are recommended by the manufacturer. Put on clean clothes or pajamas. If it is the night before your surgery, sleep in clean sheets.  During a sponge bath Follow these steps when using CHG solution during a sponge bath (unless your health care provider gives you different instructions): Use your normal soap and shampoo to wash your face and hair. Pour the CHG onto a clean washcloth. Starting at your neck, lather your body down to your toes. Make sure you follow these instructions: If you will be having surgery, pay special attention to the part of your body where you will be having surgery. Scrub this area for at least 1 minute. Do not use CHG on your head or face. If the solution gets into your ears or eyes, rinse them well with water. Avoid your genital area. Avoid any areas of skin that have broken skin, cuts, or scrapes. Scrub your back and under your arms. Make sure to wash skin folds. Let the lather sit on your skin for 1-2 minutes or as long as told by your health care provider. Using a different clean, wet washcloth, thoroughly rinse your entire body. Make sure that all body creases and crevices are rinsed well. Dry off with a clean towel. Do not put any substances on your body afterward--such as powder, lotion, or perfume--unless you are told to do so by your health care provider. Only use lotions that are recommended by the manufacturer. Put on clean clothes or pajamas. If it is the night before your surgery, sleep in clean sheets. How to use CHG prepackaged cloths Only use CHG cloths as told by your health care provider, and follow the instructions on the label. Use the CHG cloth on clean, dry skin. Do not use the CHG cloth on your head or face unless your health care provider tells you to. When washing with the CHG cloth: Avoid your genital area. Avoid any areas of skin that have broken skin, cuts, or scrapes. Before  surgery Follow these  steps when using a CHG cloth to clean before surgery (unless your health care provider gives you different instructions): Using the CHG cloth, vigorously scrub the part of your body where you will be having surgery. Scrub using a back-and-forth motion for 3 minutes. The area on your body should be completely wet with CHG when you are done scrubbing. Do not rinse. Discard the cloth and let the area air-dry. Do not put any substances on the area afterward, such as powder, lotion, or perfume. Put on clean clothes or pajamas. If it is the night before your surgery, sleep in clean sheets.  For general bathing Follow these steps when using CHG cloths for general bathing (unless your health care provider gives you different instructions). Use a separate CHG cloth for each area of your body. Make sure you wash between any folds of skin and between your fingers and toes. Wash your body in the following order, switching to a new cloth after each step: The front of your neck, shoulders, and chest. Both of your arms, under your arms, and your hands. Your stomach and groin area, avoiding the genitals. Your right leg and foot. Your left leg and foot. The back of your neck, your back, and your buttocks. Do not rinse. Discard the cloth and let the area air-dry. Do not put any substances on your body afterward--such as powder, lotion, or perfume--unless you are told to do so by your health care provider. Only use lotions that are recommended by the manufacturer. Put on clean clothes or pajamas. Contact a health care provider if: Your skin gets irritated after scrubbing. You have questions about using your solution or cloth. You swallow any chlorhexidine. Call your local poison control center (1-7011542678 in the [place] Get help right away if: Your eyes itch badly, or they become very red or swollen. Your skin itches badly and is red or swollen. Your hearing changes. You have trouble  seeing. You have swelling or tingling in your mouth or throat. You have trouble breathing. These symptoms may represent a serious problem that is an emergency. Do not wait to see if the symptoms will go away. Get medical help right away. Call your local emergency services (911 in the [place] Do not drive yourself to the hospital. Summary Chlorhexidine gluconate (CHG) is a germ-killing (antiseptic) solution that is used to clean the skin. Cleaning your skin with CHG may help to lower your risk for infection. You may be given CHG to use for bathing. It may be in a bottle or in a prepackaged cloth to use on your skin. Carefully follow your health care provider's instructions and the instructions on the product label. Do not use CHG if you have a chlorhexidine allergy. Contact your health care provider if your skin gets irritated after scrubbing. This information is not intended to replace advice given to you by your health care provider. Make sure you discuss any questions you have with your health care provider. Document Revised: 01/10/2022 Document Reviewed: 11/23/2020 Elsevier Patient Education  [place].

## 2022-12-20 ENCOUNTER — Encounter (HOSPITAL_COMMUNITY)
Admission: RE | Admit: 2022-12-20 | Discharge: 2022-12-20 | Disposition: A | Discharge status: Home / Self Care | Payer: Medicare HMO | Source: Ambulatory Visit | Attending: Urology | Admitting: Urology

## 2022-12-20 ENCOUNTER — Other Ambulatory Visit: Payer: Self-pay | Admitting: Student

## 2022-12-20 ENCOUNTER — Encounter (HOSPITAL_COMMUNITY): Payer: Self-pay

## 2022-12-20 VITALS — BP 133/59 | HR 92 | Temp 97.8°F | Resp 18 | Ht 64.0 in | Wt 197.0 lb

## 2022-12-20 DIAGNOSIS — E119 Type 2 diabetes mellitus without complications: Secondary | ICD-10-CM | POA: Diagnosis not present

## 2022-12-20 DIAGNOSIS — Z01812 Encounter for preprocedural laboratory examination: Secondary | ICD-10-CM | POA: Insufficient documentation

## 2022-12-20 DIAGNOSIS — N200 Calculus of kidney: Secondary | ICD-10-CM

## 2022-12-20 HISTORY — DX: Other intervertebral disc degeneration, lumbar region without mention of lumbar back pain or lower extremity pain: M51.369

## 2022-12-20 HISTORY — DX: Other intervertebral disc degeneration, lumbar region: M51.36

## 2022-12-20 HISTORY — DX: Personal history of urinary calculi: Z87.442

## 2022-12-20 LAB — BASIC METABOLIC PANEL
Anion gap: 8 (ref 5–15)
BUN: 21 mg/dL (ref 8–23)
CO2: 21 mmol/L — ABNORMAL LOW (ref 22–32)
Calcium: 8.8 mg/dL — ABNORMAL LOW (ref 8.9–10.3)
Chloride: 111 mmol/L (ref 98–111)
Creatinine, Ser: 0.94 mg/dL (ref 0.44–1.00)
GFR, Estimated: >60 mL/min (ref >60)
Glucose, Bld: 244 mg/dL — ABNORMAL HIGH (ref 70–99)
Potassium: 3.7 mmol/L (ref 3.5–5.1)
Sodium: 140 mmol/L (ref 135–145)

## 2022-12-21 ENCOUNTER — Ambulatory Visit (HOSPITAL_COMMUNITY)
Admission: RE | Admit: 2022-12-21 | Discharge: 2022-12-21 | Disposition: A | Discharge status: Home / Self Care | Payer: Medicare HMO | Source: Ambulatory Visit | Attending: Urology | Admitting: Urology

## 2022-12-21 ENCOUNTER — Ambulatory Visit (HOSPITAL_COMMUNITY)
Admission: RE | Admit: 2022-12-21 | Discharge: 2022-12-21 | Disposition: A | Discharge status: Home / Self Care | Payer: Medicare HMO | Source: Ambulatory Visit | Attending: Urology

## 2022-12-21 ENCOUNTER — Other Ambulatory Visit: Payer: Self-pay

## 2022-12-21 ENCOUNTER — Encounter (HOSPITAL_COMMUNITY): Payer: Self-pay

## 2022-12-21 DIAGNOSIS — I10 Essential (primary) hypertension: Secondary | ICD-10-CM | POA: Insufficient documentation

## 2022-12-21 DIAGNOSIS — E782 Mixed hyperlipidemia: Secondary | ICD-10-CM | POA: Insufficient documentation

## 2022-12-21 DIAGNOSIS — G894 Chronic pain syndrome: Secondary | ICD-10-CM | POA: Diagnosis not present

## 2022-12-21 DIAGNOSIS — Z7984 Long term (current) use of oral hypoglycemic drugs: Secondary | ICD-10-CM | POA: Insufficient documentation

## 2022-12-21 DIAGNOSIS — N200 Calculus of kidney: Secondary | ICD-10-CM

## 2022-12-21 DIAGNOSIS — K219 Gastro-esophageal reflux disease without esophagitis: Secondary | ICD-10-CM | POA: Diagnosis not present

## 2022-12-21 DIAGNOSIS — Z466 Encounter for fitting and adjustment of urinary device: Secondary | ICD-10-CM | POA: Diagnosis not present

## 2022-12-21 DIAGNOSIS — Z8669 Personal history of other diseases of the nervous system and sense organs: Secondary | ICD-10-CM | POA: Insufficient documentation

## 2022-12-21 DIAGNOSIS — Z6833 Body mass index (BMI) 33.0-33.9, adult: Secondary | ICD-10-CM | POA: Diagnosis not present

## 2022-12-21 DIAGNOSIS — Z87891 Personal history of nicotine dependence: Secondary | ICD-10-CM | POA: Insufficient documentation

## 2022-12-21 DIAGNOSIS — E669 Obesity, unspecified: Secondary | ICD-10-CM | POA: Insufficient documentation

## 2022-12-21 DIAGNOSIS — E119 Type 2 diabetes mellitus without complications: Secondary | ICD-10-CM

## 2022-12-21 DIAGNOSIS — G4733 Obstructive sleep apnea (adult) (pediatric): Secondary | ICD-10-CM | POA: Diagnosis not present

## 2022-12-21 DIAGNOSIS — J45909 Unspecified asthma, uncomplicated: Secondary | ICD-10-CM | POA: Diagnosis not present

## 2022-12-21 HISTORY — PX: IR NEPHROSTOGRAM RIGHT INITIAL PLACEMENT: IMG6058

## 2022-12-21 HISTORY — PX: IR NEPHROURETERAL CATH PLACE RIGHT: IMG6066

## 2022-12-21 LAB — CBC WITH DIFFERENTIAL/PLATELET
Abs Immature Granulocytes: 0.02 10*3/uL (ref 0.00–0.07)
Basophils Absolute: 0 10*3/uL (ref 0.0–0.1)
Basophils Relative: 0 %
Eosinophils Absolute: 0.2 10*3/uL (ref 0.0–0.5)
Eosinophils Relative: 2 %
HCT: 36.5 % (ref 36.0–46.0)
Hemoglobin: 11.3 g/dL — ABNORMAL LOW (ref 12.0–15.0)
Immature Granulocytes: 0 %
Lymphocytes Relative: 26 %
Lymphs Abs: 1.8 10*3/uL (ref 0.7–4.0)
MCH: 28.5 pg (ref 26.0–34.0)
MCHC: 31 g/dL (ref 30.0–36.0)
MCV: 91.9 fL (ref 80.0–100.0)
Monocytes Absolute: 0.6 10*3/uL (ref 0.1–1.0)
Monocytes Relative: 8 %
Neutro Abs: 4.5 10*3/uL (ref 1.7–7.7)
Neutrophils Relative %: 64 %
Platelets: 234 10*3/uL (ref 150–400)
RBC: 3.97 MIL/uL (ref 3.87–5.11)
RDW: 12.8 % (ref 11.5–15.5)
WBC: 7 10*3/uL (ref 4.0–10.5)
nRBC: 0 % (ref 0.0–0.2)

## 2022-12-21 LAB — PROTIME-INR
INR: 1 (ref 0.8–1.2)
Prothrombin Time: 13.4 seconds (ref 11.4–15.2)

## 2022-12-21 LAB — BASIC METABOLIC PANEL
Anion gap: 8 (ref 5–15)
BUN: 24 mg/dL — ABNORMAL HIGH (ref 8–23)
CO2: 22 mmol/L (ref 22–32)
Calcium: 9.2 mg/dL (ref 8.9–10.3)
Chloride: 111 mmol/L (ref 98–111)
Creatinine, Ser: 0.73 mg/dL (ref 0.44–1.00)
GFR, Estimated: >60 mL/min (ref >60)
Glucose, Bld: 210 mg/dL — ABNORMAL HIGH (ref 70–99)
Potassium: 3.4 mmol/L — ABNORMAL LOW (ref 3.5–5.1)
Sodium: 141 mmol/L (ref 135–145)

## 2022-12-21 LAB — GLUCOSE, CAPILLARY: Glucose-Capillary: 211 mg/dL — ABNORMAL HIGH (ref 70–99)

## 2022-12-21 MED ORDER — MIDAZOLAM HCL 2 MG/2ML IJ SOLN
INTRAMUSCULAR | Status: AC
  Filled 2022-12-21: qty 2

## 2022-12-21 MED ORDER — SODIUM CHLORIDE 0.9 % IV SOLN: INTRAVENOUS | Status: DC

## 2022-12-21 MED ORDER — SODIUM CHLORIDE 0.9% FLUSH: 5.0000 mL | Freq: Three times a day (TID) | INTRAVENOUS | Status: DC

## 2022-12-21 MED ORDER — LEVOFLOXACIN IN D5W 500 MG/100ML IV SOLN
500.0000 mg | INTRAVENOUS | Status: AC
  Administered 2022-12-21: 500 mg via INTRAVENOUS
  Filled 2022-12-21: qty 100

## 2022-12-21 MED ORDER — FENTANYL CITRATE (PF) 100 MCG/2ML IJ SOLN
INTRAMUSCULAR | Status: AC
  Filled 2022-12-21: qty 2

## 2022-12-21 MED ORDER — DIPHENHYDRAMINE HCL 50 MG/ML IJ SOLN
INTRAMUSCULAR | Status: AC
  Filled 2022-12-21: qty 1

## 2022-12-21 MED ORDER — FENTANYL CITRATE (PF) 100 MCG/2ML IJ SOLN
INTRAMUSCULAR | Status: AC | PRN
  Administered 2022-12-21: 75 ug via INTRAVENOUS
  Administered 2022-12-21: 25 ug via INTRAVENOUS
  Administered 2022-12-21: 75 ug via INTRAVENOUS
  Administered 2022-12-21: 25 ug via INTRAVENOUS

## 2022-12-21 MED ORDER — MIDAZOLAM HCL 2 MG/2ML IJ SOLN
INTRAMUSCULAR | Status: AC | PRN
  Administered 2022-12-21 (×2): 1.5 mg via INTRAVENOUS
  Administered 2022-12-21 (×2): .5 mg via INTRAVENOUS

## 2022-12-21 MED ORDER — HYDROCODONE-ACETAMINOPHEN 5-325 MG PO TABS
1.0000 | ORAL_TABLET | ORAL | Status: AC
  Administered 2022-12-21: 1 via ORAL

## 2022-12-21 MED ORDER — HYDROCODONE-ACETAMINOPHEN 5-325 MG PO TABS
ORAL_TABLET | ORAL | Status: AC
  Filled 2022-12-21: qty 1

## 2022-12-21 MED ORDER — LIDOCAINE HCL 1 % IJ SOLN
INTRAMUSCULAR | Status: AC
  Filled 2022-12-21: qty 20

## 2022-12-21 MED ORDER — IOHEXOL 300 MG/ML  SOLN
50.0000 mL | Freq: Once | INTRAMUSCULAR | Status: AC | PRN
  Administered 2022-12-21: 10 mL

## 2022-12-21 MED ORDER — LIDOCAINE-EPINEPHRINE 1 %-1:100000 IJ SOLN
INTRAMUSCULAR | Status: AC
  Administered 2022-12-21: 5 mL via INTRADERMAL
  Filled 2022-12-21: qty 1

## 2022-12-21 MED ORDER — BUPIVACAINE HCL (PF) 0.5 % IJ SOLN
INTRAMUSCULAR | Status: AC
  Filled 2022-12-21: qty 30

## 2022-12-21 MED ORDER — DIPHENHYDRAMINE HCL 50 MG/ML IJ SOLN
INTRAMUSCULAR | Status: AC | PRN
  Administered 2022-12-21: 50 mg via INTRAVENOUS

## 2022-12-21 NOTE — Procedures (Signed)
Pre Procedure Dx: Right sided nephrolithiasis Post Procedural Dx: Same  Successful fluoroscopic guided placement of a right sided Nephroureteral catheter with superior aspect partially uncoiled within the renal pelvis and superior aspect of the right ureter and inferior aspect within the urinary bladder. Nephroureteral catheter connected to gravity bag.  EBL: Minimal Complications: None immediate.  Ronny Bacon, MD Pager #: [phone]

## 2022-12-21 NOTE — Discharge Instructions (Addendum)
Please call Interventional Radiology clinic [phone] with any questions or concerns.   You may remove your dressing tomorrow. Keep site clean and dry.    Percutaneous Nephrostomy, Care After This sheet gives you information about how to care for yourself after your procedure. Your health care provider may also give you more specific instructions. If you have problems or questions, contact your health careprovider. What can I expect after the procedure? After the procedure, it is common to have: Some soreness where the nephrostomy tube was inserted (tube insertion site). Blood-tinged drainage from the nephrostomy tube for the first 24 hours. Follow these instructions at home: Activity  Do not lift anything that is heavier than 10 lb (4.5 kg), or the limit that you are told, until your health care provider says that it is safe. Return to your normal activities as told by your health care provider. Ask your health care provider what activities are safe for you. Avoid activities that may cause the nephrostomy tubing to bend. Do not take baths, swim, or use a hot tub until your health care provider approves. Ask your health care provider if you can take showers. Cover the nephrostomy tube bandage (dressing) with a watertight covering when you take a shower. If you were given a sedative during the procedure, it can affect you for several hours. Do not drive or operate machinery until your health care provider says that it is safe.   Care of the tube insertion site  Follow instructions from your health care provider about how to take care of your tube insertion site. Make sure you: Wash your hands with soap and water for at least 20 seconds before you change your dressing. If soap and water are not available, use hand sanitizer. Change your dressing as told by your health care provider. Be careful not to pull on the tube while removing the dressing. When you change the dressing, wash the skin  around the tube, rinse well, and pat the skin dry. Check the tube insertion area every day for signs of infection. Check for: Redness, swelling, or pain. Fluid or blood. Warmth. Pus or a bad smell.   Care of the nephrostomy tube and drainage bag Always keep the tubing, the leg bag, or the bedside drainage bags below the level of the kidney so that your urine drains freely. When connecting your nephrostomy tube to a drainage bag, make sure that there are no kinks in the tubing and that your urine is draining freely. You may want to use an elastic bandage to wrap any exposed tubing that goes from the nephrostomy tube to any of the connecting tubes. At night, you may want to connect your nephrostomy tube or the leg bag to a larger bedside drainage bag. Follow instructions from your health care provider about how to empty or change the drainage bag. Empty the drainage bag when it becomes ? full. Replace the drainage bag and any extension tubing that is connected to your nephrostomy tube every 7 days or as told by your health care provider. Your health care provider will explain how to change the drainage bag and extension tubing. General instructions Take over-the-counter and prescription medicines only as told by your health care provider. Keep all follow-up visits as told by your health care provider. This is important. The nephrostomy tube will need to be changed every 8-12 weeks. Contact a health care provider if: You have problems with any of the valves or tubing. You have persistent pain or  soreness in your back. You have redness, swelling, or pain around your tube insertion site. You have fluid or blood coming from your tube insertion site. Your tube insertion site feels warm to the touch. You have pus or a bad smell coming from your tube insertion site. You have increased urine output or you feel burning when urinating. Get help right away if: You have pain in your abdomen during the  first week. You have chest pain or have trouble breathing. You have a new appearance of blood in your urine. You have a fever or chills. You have back pain that is not relieved by your medicine. You have decreased urine output. Your nephrostomy tube comes out. Summary After the procedure, it is common to have some soreness where the nephrostomy tube was inserted (tube insertion site). Follow instructions from your health care provider about how to take care of your tube insertion site, nephrostomy tube, and drainage bag. Keep all follow-up visits for care and for changing the tube. This information is not intended to replace advice given to you by your health care provider. Make sure you discuss any questions you have with your healthcare provider. Document Revised: 10/08/2019 Document Reviewed: 10/08/2019 Elsevier Patient Education  2022 [place].   Moderate Conscious Sedation, Adult, Care After This sheet gives you information about how to care for yourself after your procedure. Your health care provider may also give you more specific instructions. If you have problems or questions, contact your health care provider. What can I expect after the procedure? After the procedure, it is common to have: Sleepiness for several hours. Impaired judgment for several hours. Difficulty with balance. Vomiting if you eat too soon. Follow these instructions at home: For the time period you were told by your health care provider: Rest. Do not participate in activities where you could fall or become injured. Do not drive or use machinery. Do not drink alcohol. Do not take sleeping pills or medicines that cause drowsiness. Do not make important decisions or sign legal documents. Do not take care of children on your own.      Eating and drinking Follow the diet recommended by your health care provider. Drink enough fluid to keep your urine pale yellow. If you vomit: Drink water, juice, or  soup when you can drink without vomiting. Make sure you have little or no nausea before eating solid foods.   General instructions Take over-the-counter and prescription medicines only as told by your health care provider. Have a responsible adult stay with you for the time you are told. It is important to have someone help care for you until you are awake and alert. Do not smoke. Keep all follow-up visits as told by your health care provider. This is important. Contact a health care provider if: You are still sleepy or having trouble with balance after 24 hours. You feel light-headed. You keep feeling nauseous or you keep vomiting. You develop a rash. You have a fever. You have redness or swelling around the IV site. Get help right away if: You have trouble breathing. You have new-onset confusion at home. Summary After the procedure, it is common to feel sleepy, have impaired judgment, or feel nauseous if you eat too soon. Rest after you get home. Know the things you should not do after the procedure. Follow the diet recommended by your health care provider and drink enough fluid to keep your urine pale yellow. Get help right away if you have trouble breathing  or new-onset confusion at home. This information is not intended to replace advice given to you by your health care provider. Make sure you discuss any questions you have with your health care provider. Document Revised: 01/10/2020 Document Reviewed: 08/08/2019 Elsevier Patient Education  2021 [employer].

## 2022-12-21 NOTE — Consult Note (Signed)
Chief Complaint: Patient was seen in consultation today for right percutaneous nephrostomy/nephroureteral catheter placement  Referring Physician(s): Astoria L  Supervising Physician: Sandi Mariscal  Patient Status: [facility] - Out-pt  History of Present Illness: Cindy Norris is a 67 y.o. female with past medical history of asthma, chronic pain syndrome, OSA, degenerative disc disease, depression, diabetes, GERD, hyperlipidemia, osteoarthritis, narcolepsy, obesity, prior seizures, hypertension and nephrolithiasis.  Recent CT done on 10/19/22 for left flank pain and hematuria revealed :  1. Large staghorn calculus in the right renal pelvis measuring 3.8 cm with mild right hydronephrosis. 2. Two tiny nonobstructing stones in the left kidney measuring up to 0.3 cm. No left hydronephrosis. 3. Small right adrenal gland adenoma measuring 1.5 cm. Recommend correlation with outside prior studies to determine stability. If prior cannot be obtained for the adenoma is new recommend CT with adrenal gland protocol. 4. Aortic atherosclerosis   She presents today for right percutaneous nephrostomy/nephroureteral catheter placement prior to nephrolithotomy at [facility] on 12/22/22.  Past Medical History:  Diagnosis Date   Asthma    Chronic pain syndrome    Degenerative disc disease, lumbar    Depression    Diabetes mellitus    GERD (gastroesophageal reflux disease)    History of kidney stones    Hyperlipidemia, mixed    Migraines    Narcolepsy    Osteoarthritis    Overweight(278.02)    Obesity   Seizures ([place])    unknown etiology-on meds-last seizure was several years ago   Unspecified essential hypertension     Past Surgical History:  Procedure Laterality Date   BREAST MASS EXCISION Right    CATARACT EXTRACTION W/PHACO Left 05/09/2022   Procedure: CATARACT EXTRACTION PHACO AND INTRAOCULAR LENS PLACEMENT ([place]);  Surgeon: Baruch Goldmann, MD;  Location: [facility] ORS;   Service: Ophthalmology;  Laterality: Left;  CDE: 10.22   CATARACT EXTRACTION W/PHACO Right 05/23/2022   Procedure: CATARACT EXTRACTION PHACO AND INTRAOCULAR LENS PLACEMENT ([facility]);  Surgeon: Baruch Goldmann, MD;  Location: [facility];  Service: Ophthalmology;  Laterality: Right;  CDE 7.15   KNEE ARTHROSCOPY     PILONIDAL CYST EXCISION     SHOULDER SURGERY Left    TOTAL KNEE ARTHROPLASTY Bilateral     Allergies: Dilaudid [hydromorphone hcl], Azithromycin, Other, Tape, Erythromycin, Lyrica [pregabalin], Penicillins, Reglan [metoclopramide], and Sulfonamide derivatives  Medications: Prior to Admission medications   Medication Sig Start Date End Date Taking? Authorizing Provider  albuterol (VENTOLIN HFA) 108 (90 Base) MCG/ACT inhaler Inhale 1-2 puffs into the lungs as needed for wheezing or shortness of breath.   Yes [provider]  atorvastatin (LIPITOR) 80 MG tablet Take 80 mg by mouth at bedtime.   Yes [provider]  busPIRone (BUSPAR) 10 MG tablet Take 10 mg by mouth 2 (two) times daily.   Yes [provider]  cephALEXin (KEFLEX) 500 MG capsule Take 500-1,000 mg by mouth See admin instructions. Take 1000 mg in the morning and 500 mg at night 12/13/22  Yes [provider]  ezetimibe (ZETIA) 10 MG tablet Take 10 mg by mouth daily. 07/29/22  Yes [provider]  gabapentin (NEURONTIN) 800 MG tablet Take 800 mg by mouth 3 (three) times daily.   Yes [provider]  HYDROcodone-acetaminophen (NORCO) 7.5-325 MG tablet Take 1 tablet by mouth every 6 (six) hours as needed for moderate pain. 12/06/22  Yes [provider]  hydrOXYzine (ATARAX/VISTARIL) 25 MG tablet Take 25 mg by mouth 2 (two) times daily.  Yes [provider]  ibuprofen (ADVIL,MOTRIN) 800 MG tablet Take 800 mg by mouth every 8 (eight) hours as needed for moderate pain.   Yes [provider]  levETIRAcetam (KEPPRA) 1000 MG tablet Take 1,000 mg by mouth 2 (two)  times daily.   Yes [provider]  metFORMIN (GLUCOPHAGE-XR) 500 MG 24 hr tablet Take 500 mg by mouth 2 (two) times daily.   Yes [provider]  midodrine (PROAMATINE) 10 MG tablet Take 15 mg by mouth 3 (three) times daily. 03/03/21  Yes [provider]  modafinil (PROVIGIL) 200 MG tablet Take 400 mg by mouth every morning.    Yes [provider]  Multiple Vitamin (MULTIVITAMIN WITH MINERALS) TABS tablet Take 1 tablet by mouth daily.   Yes [provider]  nabumetone (RELAFEN) 750 MG tablet Take 750 mg by mouth 2 (two) times daily. 04/12/21  Yes [provider]  omeprazole (PRILOSEC) 40 MG capsule Take 80 mg by mouth at bedtime. 04/07/21  Yes [provider]  oxybutynin (DITROPAN) 5 MG tablet Take 5 mg by mouth 3 (three) times daily.   Yes [provider]  Polyethyl Glycol-Propyl Glycol (SYSTANE OP) Place 1 drop into both eyes 2 (two) times daily.   Yes [provider]  SUMAtriptan (IMITREX) 50 MG tablet Take 50-100 mg by mouth every 2 (two) hours as needed for migraine. May repeat in 2 hours if headache persists or recurs.   Yes [provider]  topiramate (TOPAMAX) 100 MG tablet Take 100 mg by mouth 2 (two) times daily.   Yes [provider]  venlafaxine XR (EFFEXOR-XR) 150 MG 24 hr capsule Take 300 mg by mouth daily.   Yes [provider]  Dulaglutide 1.5 MG/0.5ML SOPN Inject 1.5 mg into the skin every Wednesday.    [provider]  lidocaine (LIDODERM) 5 % Place 1 patch onto the skin daily as needed (pain). Remove & Discard patch within 12 hours or as directed by MD    [provider]     Family History  Problem Relation Age of Onset   Coronary artery disease Other     Social History   Socioeconomic History   Marital status: Divorced    Spouse name: Not on file   Number of children: Not on file   Years of education: Not on file   Highest education level: Not on  file  Occupational History   Occupation: Disabled  Tobacco Use   Smoking status: Former    Packs/day: 0.50    Years: 4.00    Additional pack years: 0.00    Total pack years: 2.00    Types: Cigarettes    Quit date: 09/26/1988    Years since quitting: 34.2   Smokeless tobacco: Never  Vaping Use   Vaping Use: Never used  Substance and Sexual Activity   Alcohol use: No   Drug use: Never   Sexual activity: Not Currently  Other Topics Concern   Not on file  Social History Narrative   Divorced   Social Determinants of Health   Financial Resource Strain: Not on file  Food Insecurity: Not on file  Transportation Needs: Not on file  Physical Activity: Not on file  Stress: Not on file  Social Connections: Not on file      Review of Systems: Denies fever, headache, chest pain, dyspnea, cough, nausea, vomiting.  Does have some occasional abdominal and back discomfort along with reported history of hematuria  Vital Signs:  Temp 97.6 F (36.4 C) (Oral)   Resp 18   Ht 5\' 4"  (1.626 m)   Wt 197 lb (89.4 kg)   BMI 33.82 kg/m   Code Status: FULL CODE   Physical Exam: Patient awake, alert.  Chest clear to auscultation bilaterally.  Heart with regular rate and rhythm.  Abdomen obese, protuberant, soft, positive bowel sounds, some mild diffuse tenderness to palpation.  No significant lower extremity edema.  Imaging: No results found.  Labs:  CBC: Recent Labs    12/21/22 0802  WBC 7.0  HGB 11.3*  HCT 36.5  PLT 234    COAGS: Recent Labs    12/21/22 0802  INR 1.0    BMP: Recent Labs    12/20/22 1401 12/21/22 0802  NA 140 141  K 3.7 3.4*  CL 111 111  CO2 21* 22  GLUCOSE 244* 210*  BUN 21 24*  CALCIUM 8.8* 9.2  CREATININE 0.94 0.73  GFRNONAA >60 >60    LIVER FUNCTION TESTS: No results for input(s): "BILITOT", "AST", "ALT", "ALKPHOS", "PROT", "ALBUMIN" in the last 8760 hours.  TUMOR MARKERS: No results for input(s): "AFPTM", "CEA", "CA199", "CHROMGRNA" in  the last 8760 hours.  Assessment and Plan: 67 y.o. female with past medical history of asthma, chronic pain syndrome, OSA, degenerative disc disease, depression, diabetes, GERD, hyperlipidemia, osteoarthritis, narcolepsy, obesity, prior seizures, hypertension and nephrolithiasis.  Recent CT done on 10/19/22 for left flank pain and hematuria revealed :  1. Large staghorn calculus in the right renal pelvis measuring 3.8 cm with mild right hydronephrosis. 2. Two tiny nonobstructing stones in the left kidney measuring up to 0.3 cm. No left hydronephrosis. 3. Small right adrenal gland adenoma measuring 1.5 cm. Recommend correlation with outside prior studies to determine stability. If prior cannot be obtained for the adenoma is new recommend CT with adrenal gland protocol. 4. Aortic atherosclerosis   She presents today for right percutaneous nephrostomy/nephroureteral catheter placement prior to nephrolithotomy at [facility] on 12/22/22.Risks and benefits of right PCN placement was discussed with the patient /sister including, but not limited to, infection, bleeding, significant bleeding causing loss or decrease in renal function or damage to adjacent structures.   All of the patient's questions were answered, patient is agreeable to proceed.  Consent signed and in chart.      Thank you for this interesting consult.  I greatly enjoyed meeting JEZABEL SHAHEED and look forward to participating in their care.  A copy of this report was sent to the requesting provider on this date.  Electronically Signed: D. Rowe Robert, PA-C 12/21/2022, 8:52 AM   I spent a total of  25 minutes   in face to face in clinical consultation, greater than 50% of which was counseling/coordinating care for right percutaneous nephrostomy/nephroureteral catheter placement

## 2022-12-22 ENCOUNTER — Observation Stay (HOSPITAL_COMMUNITY)
Admission: RE | Admit: 2022-12-22 | Discharge: 2022-12-23 | Disposition: A | Discharge status: Home / Self Care | Payer: Medicaid Other | Source: Ambulatory Visit | Attending: Urology | Admitting: Urology

## 2022-12-22 ENCOUNTER — Other Ambulatory Visit: Payer: Self-pay

## 2022-12-22 ENCOUNTER — Encounter (HOSPITAL_COMMUNITY): Payer: Self-pay | Admitting: Urology

## 2022-12-22 ENCOUNTER — Encounter (HOSPITAL_COMMUNITY)
Admission: RE | Disposition: A | Discharge status: Home / Self Care | Payer: Self-pay | Source: Ambulatory Visit | Attending: Urology

## 2022-12-22 ENCOUNTER — Ambulatory Visit (HOSPITAL_COMMUNITY): Payer: Medicare HMO

## 2022-12-22 ENCOUNTER — Ambulatory Visit (HOSPITAL_COMMUNITY): Payer: Medicare HMO | Admitting: Anesthesiology

## 2022-12-22 ENCOUNTER — Ambulatory Visit (HOSPITAL_BASED_OUTPATIENT_CLINIC_OR_DEPARTMENT_OTHER): Payer: Medicare HMO | Admitting: Anesthesiology

## 2022-12-22 DIAGNOSIS — I10 Essential (primary) hypertension: Secondary | ICD-10-CM | POA: Diagnosis not present

## 2022-12-22 DIAGNOSIS — E119 Type 2 diabetes mellitus without complications: Secondary | ICD-10-CM | POA: Diagnosis not present

## 2022-12-22 DIAGNOSIS — Z87891 Personal history of nicotine dependence: Secondary | ICD-10-CM | POA: Diagnosis not present

## 2022-12-22 DIAGNOSIS — Z7982 Long term (current) use of aspirin: Secondary | ICD-10-CM | POA: Insufficient documentation

## 2022-12-22 DIAGNOSIS — Z79899 Other long term (current) drug therapy: Secondary | ICD-10-CM | POA: Insufficient documentation

## 2022-12-22 DIAGNOSIS — G4730 Sleep apnea, unspecified: Secondary | ICD-10-CM | POA: Diagnosis not present

## 2022-12-22 DIAGNOSIS — J45909 Unspecified asthma, uncomplicated: Secondary | ICD-10-CM | POA: Insufficient documentation

## 2022-12-22 DIAGNOSIS — N200 Calculus of kidney: Secondary | ICD-10-CM

## 2022-12-22 DIAGNOSIS — Z96653 Presence of artificial knee joint, bilateral: Secondary | ICD-10-CM | POA: Insufficient documentation

## 2022-12-22 HISTORY — PX: NEPHROLITHOTOMY: SHX5134

## 2022-12-22 LAB — HEMOGLOBIN A1C
Hgb A1c MFr Bld: 8 % — ABNORMAL HIGH (ref 4.8–5.6)
Mean Plasma Glucose: 183 mg/dL

## 2022-12-22 LAB — GLUCOSE, CAPILLARY
Glucose-Capillary: 219 mg/dL — ABNORMAL HIGH (ref 70–99)
Glucose-Capillary: 218 mg/dL — ABNORMAL HIGH (ref 70–99)
Glucose-Capillary: 283 mg/dL — ABNORMAL HIGH (ref 70–99)
Glucose-Capillary: 300 mg/dL — ABNORMAL HIGH (ref 70–99)
Glucose-Capillary: 285 mg/dL — ABNORMAL HIGH (ref 70–99)

## 2022-12-22 LAB — CBC
HCT: 36.4 % (ref 36.0–46.0)
Hemoglobin: 11.5 g/dL — ABNORMAL LOW (ref 12.0–15.0)
MCH: 29.2 pg (ref 26.0–34.0)
MCHC: 31.6 g/dL (ref 30.0–36.0)
MCV: 92.4 fL (ref 80.0–100.0)
Platelets: 278 10*3/uL (ref 150–400)
RBC: 3.94 MIL/uL (ref 3.87–5.11)
RDW: 12.6 % (ref 11.5–15.5)
WBC: 17.4 10*3/uL — ABNORMAL HIGH (ref 4.0–10.5)
nRBC: 0 % (ref 0.0–0.2)

## 2022-12-22 SURGERY — NEPHROLITHOTOMY PERCUTANEOUS
Anesthesia: General | Site: Renal | Laterality: Right

## 2022-12-22 MED ORDER — DIPHENHYDRAMINE HCL 50 MG/ML IJ SOLN: 12.5000 mg | Freq: Four times a day (QID) | INTRAMUSCULAR | Status: DC | PRN

## 2022-12-22 MED ORDER — VENLAFAXINE HCL ER 75 MG PO CP24
300.0000 mg | ORAL_CAPSULE | Freq: Every day | ORAL | Status: DC
  Administered 2022-12-23: 300 mg via ORAL
  Filled 2022-12-22: qty 4
  Filled 2022-12-22 (×2): qty 2

## 2022-12-22 MED ORDER — SODIUM CHLORIDE 0.9 % IV SOLN: INTRAVENOUS | Status: DC

## 2022-12-22 MED ORDER — PROPOFOL 10 MG/ML IV BOLUS
INTRAVENOUS | Status: AC
  Filled 2022-12-22: qty 20

## 2022-12-22 MED ORDER — SUGAMMADEX SODIUM 200 MG/2ML IV SOLN
INTRAVENOUS | Status: DC | PRN
  Administered 2022-12-22: 200 mg via INTRAVENOUS

## 2022-12-22 MED ORDER — LIDOCAINE HCL (PF) 2 % IJ SOLN
INTRAMUSCULAR | Status: AC
  Filled 2022-12-22: qty 5

## 2022-12-22 MED ORDER — ONDANSETRON HCL 4 MG/2ML IJ SOLN
INTRAMUSCULAR | Status: DC | PRN
  Administered 2022-12-22: 4 mg via INTRAVENOUS

## 2022-12-22 MED ORDER — DIPHENHYDRAMINE HCL 12.5 MG/5ML PO ELIX
12.5000 mg | ORAL_SOLUTION | Freq: Four times a day (QID) | ORAL | Status: DC | PRN
  Filled 2022-12-22: qty 5

## 2022-12-22 MED ORDER — METOPROLOL TARTRATE 5 MG/5ML IV SOLN
INTRAVENOUS | Status: DC | PRN
  Administered 2022-12-22: 1 mg via INTRAVENOUS

## 2022-12-22 MED ORDER — LEVETIRACETAM 500 MG PO TABS
1000.0000 mg | ORAL_TABLET | Freq: Two times a day (BID) | ORAL | Status: DC
  Administered 2022-12-22 – 2022-12-23 (×2): 1000 mg via ORAL
  Filled 2022-12-22 (×2): qty 2

## 2022-12-22 MED ORDER — BUSPIRONE HCL 5 MG PO TABS
10.0000 mg | ORAL_TABLET | Freq: Two times a day (BID) | ORAL | Status: DC
  Administered 2022-12-22 – 2022-12-23 (×2): 10 mg via ORAL
  Filled 2022-12-22 (×2): qty 2
  Filled 2022-12-22 (×2): qty 1

## 2022-12-22 MED ORDER — ONDANSETRON HCL 4 MG/2ML IJ SOLN
4.0000 mg | INTRAMUSCULAR | Status: DC | PRN
  Administered 2022-12-22: 4 mg via INTRAVENOUS
  Filled 2022-12-22: qty 2

## 2022-12-22 MED ORDER — ALBUTEROL SULFATE (2.5 MG/3ML) 0.083% IN NEBU: 2.5000 mg | INHALATION_SOLUTION | Freq: Four times a day (QID) | RESPIRATORY_TRACT | Status: DC | PRN

## 2022-12-22 MED ORDER — ACETAMINOPHEN 325 MG PO TABS
650.0000 mg | ORAL_TABLET | ORAL | Status: DC | PRN
  Administered 2022-12-22: 650 mg via ORAL
  Filled 2022-12-22: qty 2

## 2022-12-22 MED ORDER — ORAL CARE MOUTH RINSE: 15.0000 mL | Freq: Once | OROMUCOSAL | Status: AC

## 2022-12-22 MED ORDER — ATORVASTATIN CALCIUM 40 MG PO TABS
80.0000 mg | ORAL_TABLET | Freq: Every day | ORAL | Status: DC
  Administered 2022-12-22: 80 mg via ORAL
  Filled 2022-12-22: qty 2

## 2022-12-22 MED ORDER — LEVETIRACETAM IN NACL 1000 MG/100ML IV SOLN
1000.0000 mg | Freq: Two times a day (BID) | INTRAVENOUS | Status: DC
  Administered 2022-12-22: 1000 mg via INTRAVENOUS
  Filled 2022-12-22 (×2): qty 100

## 2022-12-22 MED ORDER — GABAPENTIN 400 MG PO CAPS
800.0000 mg | ORAL_CAPSULE | Freq: Three times a day (TID) | ORAL | Status: DC
  Administered 2022-12-22 – 2022-12-23 (×3): 800 mg via ORAL
  Filled 2022-12-22 (×15): qty 2

## 2022-12-22 MED ORDER — NABUMETONE 500 MG PO TABS
750.0000 mg | ORAL_TABLET | Freq: Two times a day (BID) | ORAL | Status: DC
  Administered 2022-12-22: 750 mg via ORAL
  Filled 2022-12-22: qty 2
  Filled 2022-12-22 (×2): qty 1
  Filled 2022-12-22 (×5): qty 2

## 2022-12-22 MED ORDER — LACTATED RINGERS IV SOLN: INTRAVENOUS | Status: DC

## 2022-12-22 MED ORDER — DIATRIZOATE MEGLUMINE 30 % UR SOLN
URETHRAL | Status: DC | PRN
  Administered 2022-12-22: 21 mL via URETHRAL

## 2022-12-22 MED ORDER — CHLORHEXIDINE GLUCONATE 0.12 % MT SOLN
15.0000 mL | Freq: Once | OROMUCOSAL | Status: AC
  Administered 2022-12-22: 15 mL via OROMUCOSAL

## 2022-12-22 MED ORDER — INSULIN ASPART 100 UNIT/ML IJ SOLN
0.0000 [IU] | Freq: Three times a day (TID) | INTRAMUSCULAR | Status: DC
  Administered 2022-12-22 (×2): 8 [IU] via SUBCUTANEOUS
  Administered 2022-12-23: 5 [IU] via SUBCUTANEOUS

## 2022-12-22 MED ORDER — DEXAMETHASONE SODIUM PHOSPHATE 10 MG/ML IJ SOLN
INTRAMUSCULAR | Status: DC | PRN
  Administered 2022-12-22: 4 mg via INTRAVENOUS

## 2022-12-22 MED ORDER — FENTANYL CITRATE (PF) 100 MCG/2ML IJ SOLN
INTRAMUSCULAR | Status: DC | PRN
  Administered 2022-12-22: 25 ug via INTRAVENOUS
  Administered 2022-12-22 (×2): 50 ug via INTRAVENOUS
  Administered 2022-12-22 (×2): 25 ug via INTRAVENOUS

## 2022-12-22 MED ORDER — PROPOFOL 10 MG/ML IV BOLUS
INTRAVENOUS | Status: DC | PRN
  Administered 2022-12-22: 140 mg via INTRAVENOUS

## 2022-12-22 MED ORDER — CEFAZOLIN SODIUM-DEXTROSE 2-4 GM/100ML-% IV SOLN
2.0000 g | INTRAVENOUS | Status: AC
  Administered 2022-12-22: 2 g via INTRAVENOUS

## 2022-12-22 MED ORDER — ALBUTEROL SULFATE HFA 108 (90 BASE) MCG/ACT IN AERS: 1.0000 | INHALATION_SPRAY | RESPIRATORY_TRACT | Status: DC | PRN

## 2022-12-22 MED ORDER — ZOLPIDEM TARTRATE 5 MG PO TABS: 5.0000 mg | ORAL_TABLET | Freq: Every evening | ORAL | Status: DC | PRN

## 2022-12-22 MED ORDER — PHENYLEPHRINE 80 MCG/ML (10ML) SYRINGE FOR IV PUSH (FOR BLOOD PRESSURE SUPPORT)
PREFILLED_SYRINGE | INTRAVENOUS | Status: DC | PRN
  Administered 2022-12-22: 80 ug via INTRAVENOUS

## 2022-12-22 MED ORDER — FENTANYL CITRATE (PF) 250 MCG/5ML IJ SOLN
INTRAMUSCULAR | Status: AC
  Filled 2022-12-22: qty 5

## 2022-12-22 MED ORDER — LIDOCAINE 2% (20 MG/ML) 5 ML SYRINGE
INTRAMUSCULAR | Status: DC | PRN
  Administered 2022-12-22: 80 mg via INTRAVENOUS

## 2022-12-22 MED ORDER — PANTOPRAZOLE SODIUM 40 MG PO TBEC
40.0000 mg | DELAYED_RELEASE_TABLET | Freq: Every day | ORAL | Status: DC
  Administered 2022-12-23: 40 mg via ORAL
  Filled 2022-12-22: qty 1

## 2022-12-22 MED ORDER — MIDAZOLAM HCL 2 MG/2ML IJ SOLN
INTRAMUSCULAR | Status: AC
  Filled 2022-12-22: qty 2

## 2022-12-22 MED ORDER — MIDODRINE HCL 5 MG PO TABS
15.0000 mg | ORAL_TABLET | Freq: Three times a day (TID) | ORAL | Status: DC
  Administered 2022-12-22 – 2022-12-23 (×2): 15 mg via ORAL
  Filled 2022-12-22 (×2): qty 3

## 2022-12-22 MED ORDER — MEPERIDINE HCL 50 MG/ML IJ SOLN: 6.2500 mg | INTRAMUSCULAR | Status: DC | PRN

## 2022-12-22 MED ORDER — INSULIN ASPART 100 UNIT/ML IJ SOLN
0.0000 [IU] | Freq: Every day | INTRAMUSCULAR | Status: DC
  Administered 2022-12-22: 3 [IU] via SUBCUTANEOUS

## 2022-12-22 MED ORDER — CEFAZOLIN SODIUM-DEXTROSE 2-4 GM/100ML-% IV SOLN
INTRAVENOUS | Status: AC
  Filled 2022-12-22: qty 100

## 2022-12-22 MED ORDER — HYDROXYZINE HCL 25 MG PO TABS
25.0000 mg | ORAL_TABLET | Freq: Two times a day (BID) | ORAL | Status: DC
  Administered 2022-12-22 – 2022-12-23 (×2): 25 mg via ORAL
  Filled 2022-12-22 (×2): qty 1

## 2022-12-22 MED ORDER — ROCURONIUM BROMIDE 100 MG/10ML IV SOLN
INTRAVENOUS | Status: DC | PRN
  Administered 2022-12-22: 50 mg via INTRAVENOUS

## 2022-12-22 MED ORDER — TOPIRAMATE 100 MG PO TABS
100.0000 mg | ORAL_TABLET | Freq: Two times a day (BID) | ORAL | Status: DC
  Administered 2022-12-22 – 2022-12-23 (×2): 100 mg via ORAL
  Filled 2022-12-22 (×2): qty 1

## 2022-12-22 MED ORDER — FENTANYL CITRATE PF 50 MCG/ML IJ SOSY
25.0000 ug | PREFILLED_SYRINGE | INTRAMUSCULAR | Status: DC | PRN
  Administered 2022-12-22: 50 ug via INTRAVENOUS
  Filled 2022-12-22 (×2): qty 1

## 2022-12-22 MED ORDER — SODIUM CHLORIDE 0.9 % IR SOLN
Status: DC | PRN
  Administered 2022-12-22 (×6): 3000 mL

## 2022-12-22 MED ORDER — SENNOSIDES-DOCUSATE SODIUM 8.6-50 MG PO TABS
2.0000 | ORAL_TABLET | Freq: Every day | ORAL | Status: DC
  Administered 2022-12-22: 2 via ORAL
  Filled 2022-12-22: qty 2

## 2022-12-22 MED ORDER — STERILE WATER FOR IRRIGATION IR SOLN
Status: DC | PRN
  Administered 2022-12-22: 500 mL

## 2022-12-22 MED ORDER — ROCURONIUM BROMIDE 10 MG/ML (PF) SYRINGE
PREFILLED_SYRINGE | INTRAVENOUS | Status: AC
  Filled 2022-12-22: qty 10

## 2022-12-22 MED ORDER — DIATRIZOATE MEGLUMINE 30 % UR SOLN
URETHRAL | Status: AC
  Filled 2022-12-22: qty 100

## 2022-12-22 MED ORDER — EZETIMIBE 10 MG PO TABS
10.0000 mg | ORAL_TABLET | Freq: Every day | ORAL | Status: DC
  Administered 2022-12-23: 10 mg via ORAL
  Filled 2022-12-22: qty 1

## 2022-12-22 MED ORDER — OXYCODONE HCL 5 MG PO TABS
ORAL_TABLET | ORAL | Status: AC
  Filled 2022-12-22: qty 1

## 2022-12-22 MED ORDER — OXYCODONE HCL 5 MG PO TABS
5.0000 mg | ORAL_TABLET | ORAL | Status: DC | PRN
  Administered 2022-12-22 – 2022-12-23 (×4): 5 mg via ORAL
  Filled 2022-12-22 (×4): qty 1

## 2022-12-22 MED ORDER — MODAFINIL 200 MG PO TABS
400.0000 mg | ORAL_TABLET | ORAL | Status: DC
  Administered 2022-12-23: 400 mg via ORAL
  Filled 2022-12-22: qty 2

## 2022-12-22 MED ORDER — FENTANYL CITRATE PF 50 MCG/ML IJ SOSY
25.0000 ug | PREFILLED_SYRINGE | INTRAMUSCULAR | Status: DC | PRN
  Administered 2022-12-22 (×3): 50 ug via INTRAVENOUS
  Filled 2022-12-22 (×3): qty 1

## 2022-12-22 MED ORDER — ONDANSETRON HCL 4 MG/2ML IJ SOLN: 4.0000 mg | Freq: Once | INTRAMUSCULAR | Status: DC | PRN

## 2022-12-22 SURGICAL SUPPLY — 59 items
APL PRP STRL LF DISP 70% ISPRP (MISCELLANEOUS) ×2
APL SKNCLS STERI-STRIP NONHPOA (GAUZE/BANDAGES/DRESSINGS) ×4
BAG DRN RND TRDRP ANRFLXCHMBR (UROLOGICAL SUPPLIES) ×2
BAG URINE DRAIN 2000ML AR STRL (UROLOGICAL SUPPLIES) ×2 IMPLANT
BENZOIN TINCTURE PRP APPL 2/3 (GAUZE/BANDAGES/DRESSINGS) ×4 IMPLANT
BLADE SURG 15 STRL LF DISP TIS (BLADE) ×2 IMPLANT
BLADE SURG 15 STRL SS (BLADE) ×2
CATCHER STONE W/TUBE ADAPTER (UROLOGICAL SUPPLIES) ×1 IMPLANT
CATH BALLN NEPH 30X15 (BALLOONS) ×1 IMPLANT
CATH FOLEY 2W COUNCIL 20FR 5CC (CATHETERS) ×1 IMPLANT
CATH FOLEY 2WAY SLVR  5CC 18FR (CATHETERS) ×2
CATH FOLEY 2WAY SLVR 5CC 18FR (CATHETERS) ×2 IMPLANT
CATH INTERMIT  6FR 70CM (CATHETERS) ×2 IMPLANT
CATH UROLOGY TORQUE 65 (CATHETERS) ×2 IMPLANT
CATH X-FORCE N30 NEPHROSTOMY (TUBING) ×2 IMPLANT
CHLORAPREP W/TINT 26 (MISCELLANEOUS) ×1 IMPLANT
COVER LIGHT HANDLE STERIS (MISCELLANEOUS) ×4 IMPLANT
DRAPE C-ARM FOLDED MOBILE STRL (DRAPES) ×2 IMPLANT
DRAPE HALF SHEET 40X57 (DRAPES) ×2 IMPLANT
DRAPE LINGEMAN PERC (DRAPES) ×2 IMPLANT
DRSG PAD ABDOMINAL 8X10 ST (GAUZE/BANDAGES/DRESSINGS) ×4 IMPLANT
DRSG TEGADERM 4X10 (GAUZE/BANDAGES/DRESSINGS) ×3 IMPLANT
DRSG TEGADERM 8X12 (GAUZE/BANDAGES/DRESSINGS) ×6 IMPLANT
GAUZE 4X4 16PLY ~~LOC~~+RFID DBL (SPONGE) ×2 IMPLANT
GAUZE SPONGE 4X4 12PLY STRL (GAUZE/BANDAGES/DRESSINGS) ×2 IMPLANT
GLOVE BIO SURGEON STRL SZ8 (GLOVE) ×2 IMPLANT
GLOVE BIOGEL PI IND STRL 7.0 (GLOVE) ×4 IMPLANT
GLOVE BIOGEL PI IND STRL 8 (GLOVE) IMPLANT
GOWN STRL REUS W/TWL LRG LVL3 (GOWN DISPOSABLE) ×2 IMPLANT
GOWN STRL REUS W/TWL XL LVL3 (GOWN DISPOSABLE) ×2 IMPLANT
GUIDEWIRE AMPLAZ .035X145 (WIRE) ×2 IMPLANT
GUIDEWIRE STR DUAL SENSOR (WIRE) ×2 IMPLANT
IV NS 500ML (IV SOLUTION) ×2
IV NS 500ML BAXH (IV SOLUTION) ×2 IMPLANT
IV NS IRRIG 3000ML ARTHROMATIC (IV SOLUTION) ×8 IMPLANT
KIT BLADEGUARD II DBL (SET/KITS/TRAYS/PACK) ×2 IMPLANT
KIT PROBE TRILOGY 3.9X350 (MISCELLANEOUS) ×2 IMPLANT
KIT TURNOVER KIT A (KITS) ×2 IMPLANT
MANIFOLD NEPTUNE II (INSTRUMENTS) ×2 IMPLANT
PACK CYSTO (CUSTOM PROCEDURE TRAY) ×2 IMPLANT
PAD ABD 5X9 TENDERSORB (GAUZE/BANDAGES/DRESSINGS) ×2 IMPLANT
PAD ARMBOARD 7.5X6 YLW CONV (MISCELLANEOUS) ×2 IMPLANT
POSITIONER HEAD PRONE TRACH (MISCELLANEOUS) ×2 IMPLANT
PROBE PNEUMATIC 1.0MMX570MM (UROLOGICAL SUPPLIES) ×1 IMPLANT
SET AMPLATZ RENAL DILATOR (MISCELLANEOUS) IMPLANT
SET BASIN LINEN APH (SET/KITS/TRAYS/PACK) ×2 IMPLANT
SET IRRIG Y TYPE TUR BLADDER L (SET/KITS/TRAYS/PACK) IMPLANT
SHEATH COOK PEEL AWAY SET 9F (SHEATH) IMPLANT
SHEATH PEELAWAY SET 9 (SHEATH) ×2 IMPLANT
STENT URET 6FRX26 CONTOUR (STENTS) ×1 IMPLANT
STONE CATCHER W/TUBE ADAPTER (UROLOGICAL SUPPLIES) ×2 IMPLANT
SUT SILK 2 0 SH (SUTURE) ×2 IMPLANT
SYR 50ML LL SCALE MARK (SYRINGE) ×2 IMPLANT
TRACTIP FLEXIVA PULS ID 200XHI (Laser) IMPLANT
TRACTIP FLEXIVA PULSE ID 200 (Laser)
TRAY FOLEY W/BAG SLVR 16FR (SET/KITS/TRAYS/PACK) ×2
TRAY FOLEY W/BAG SLVR 16FR ST (SET/KITS/TRAYS/PACK) ×2 IMPLANT
WATER STERILE IRR 1000ML POUR (IV SOLUTION) ×2 IMPLANT
YANKAUER SUCT BULB TIP 10FT TU (MISCELLANEOUS) ×4 IMPLANT

## 2022-12-22 NOTE — Anesthesia Procedure Notes (Signed)
Procedure Name: Intubation Date/Time: 12/22/2022 7:44 AM  Performed by: Gwyndolyn Saxon, CRNAPre-anesthesia Checklist: Patient identified, Emergency Drugs available, Suction available and Patient being monitored Patient Re-evaluated:Patient Re-evaluated prior to induction Oxygen Delivery Method: Circle System Utilized Preoxygenation: Pre-oxygenation with 100% oxygen Induction Type: IV induction Ventilation: Mask ventilation without difficulty Laryngoscope Size: Mac and Miller Grade View: Grade I Tube type: Oral Tube size: 7.0 mm Number of attempts: 1 Airway Equipment and Method: Stylet and Oral airway Placement Confirmation: ETT inserted through vocal cords under direct vision, positive ETCO2 and breath sounds checked- equal and bilateral Secured at: 21 cm Tube secured with: Tape Dental Injury: Teeth and Oropharynx as per pre-operative assessment

## 2022-12-22 NOTE — Anesthesia Preprocedure Evaluation (Signed)
Anesthesia Evaluation  Patient identified by MRN, date of birth, ID band Patient awake    Reviewed: Allergy & Precautions, H&P , NPO status , Patient's Chart, lab work & pertinent test results  History of Anesthesia Complications (+) DIFFICULT AIRWAY and history of anesthetic complications  Airway Mallampati: II  TM Distance: >3 FB Neck ROM: Full    Dental  (+) Edentulous Upper, Edentulous Lower   Pulmonary asthma , sleep apnea , former smoker   Pulmonary exam normal breath sounds clear to auscultation       Cardiovascular hypertension, Pt. on medications Normal cardiovascular exam Rhythm:Regular Rate:Normal     Neuro/Psych  Headaches, Seizures -,  PSYCHIATRIC DISORDERS  Depression       GI/Hepatic Neg liver ROS,GERD  Medicated,,  Endo/Other  diabetes, Well Controlled, Type 2, Oral Hypoglycemic Agents    Renal/GU negative Renal ROS  negative genitourinary   Musculoskeletal  (+) Arthritis , Osteoarthritis,    Abdominal   Peds negative pediatric ROS (+)  Hematology negative hematology ROS (+)   Anesthesia Other Findings   Reproductive/Obstetrics negative OB ROS                             Anesthesia Physical Anesthesia Plan  ASA: 3  Anesthesia Plan: General   Post-op Pain Management: Dilaudid IV   Induction: Intravenous  PONV Risk Score and Plan: 4 or greater and Ondansetron and Dexamethasone  Airway Management Planned: Oral ETT and Video Laryngoscope Planned  Additional Equipment:   Intra-op Plan:   Post-operative Plan: Extubation in OR and Possible Post-op intubation/ventilation  Informed Consent: I have reviewed the patients History and Physical, chart, labs and discussed the procedure including the risks, benefits and alternatives for the proposed anesthesia with the patient or authorized representative who has indicated his/her understanding and acceptance.     Dental  advisory given  Plan Discussed with: CRNA and Surgeon  Anesthesia Plan Comments: (Patient was told that she had either difficult airway or difficult to pass ETT, will use glidescope to intubate and smaller ETT 6.5. did not take her seizure meds since yesterday, will give pre op keppra 1000 mg iv.)        Anesthesia Quick Evaluation

## 2022-12-22 NOTE — H&P (Signed)
HPI: Cindy Norris is a 67yo here for evaluation of nephrolithiasis 1 month ago she developed bilateral flank pain and underwent CT which showed a right staghorn calculus. She had 1 stone event 1-2 years ago and does not recall passing the stone.      PMH:     Past Medical History:  Diagnosis Date   Asthma     Chronic pain syndrome     Depression     Diabetes mellitus     GERD (gastroesophageal reflux disease)     Hyperlipidemia, mixed     Migraines     Narcolepsy     Osteoarthritis     Overweight(278.02)      Obesity   Seizures ([place])      unknown etiology-on meds-last seizure was several years ago   Unspecified essential hypertension        Surgical History:      Past Surgical History:  Procedure Laterality Date   BREAST MASS EXCISION Right     CATARACT EXTRACTION W/PHACO Left 05/09/2022    Procedure: CATARACT EXTRACTION PHACO AND INTRAOCULAR LENS PLACEMENT ([place]);  Surgeon: Baruch Goldmann, MD;  Location: [facility];  Service: Ophthalmology;  Laterality: Left;  CDE: 10.22   CATARACT EXTRACTION W/PHACO Right 05/23/2022    Procedure: CATARACT EXTRACTION PHACO AND INTRAOCULAR LENS PLACEMENT ([facility]);  Surgeon: Baruch Goldmann, MD;  Location: [facility];  Service: Ophthalmology;  Laterality: Right;  CDE 7.15   KNEE ARTHROSCOPY       PILONIDAL CYST EXCISION       TOTAL KNEE ARTHROPLASTY Bilateral        Home Medications:  Allergies as of 11/08/2022         Reactions    Dilaudid [hydromorphone Hcl] Palpitations, Rash    Azithromycin Nausea And Vomiting    Other      Dawn dish detergent-RASH    Tape Other (See Comments)    Blister    Erythromycin Nausea Only    Hydromorphone Hcl Palpitations, Rash    Lyrica [pregabalin] Swelling, Rash    Penicillins Nausea Only    Reglan [metoclopramide] Palpitations    Sulfonamide Derivatives Rash            Medication List           Accurate as of November 08, 2022 12:23 PM. If you have any questions, ask your nurse or doctor.               albuterol 108 (90 Base) MCG/ACT inhaler Commonly known as: VENTOLIN HFA Inhale 1-2 puffs into the lungs as needed for wheezing or shortness of breath.    ALPRAZolam 0.5 MG tablet Commonly known as: XANAX Take by mouth.    aspirin EC 81 MG tablet Take by mouth.    atorvastatin 80 MG tablet Commonly known as: LIPITOR Take 80 mg by mouth at bedtime.    benzonatate 100 MG capsule Commonly known as: TESSALON Take 1 capsule (100 mg total) by mouth every 8 (eight) hours.    busPIRone 10 MG tablet Commonly known as: BUSPAR Take 10 mg by mouth 2 (two) times daily.    cyclobenzaprine 10 MG tablet Commonly known as: FLEXERIL Take 10 mg by mouth in the morning and at bedtime.    Dulaglutide 1.5 MG/0.5ML Sopn Inject 1.5 mg into the skin every Wednesday.    EARACHE DROPS OT Place 2 drops in ear(s) daily as needed (ear pain).    ezetimibe 10 MG tablet Commonly known as: ZETIA  gabapentin 800 MG tablet Commonly known as: NEURONTIN Take 800 mg by mouth 2 (two) times daily.    HYDROcodone-acetaminophen 7.5-325 MG tablet Commonly known as: NORCO Take 1 tablet by mouth 2 (two) times daily as needed for moderate pain.    hydrOXYzine 25 MG tablet Commonly known as: ATARAX Take 25 mg by mouth 2 (two) times daily.    ibuprofen 800 MG tablet Commonly known as: ADVIL Take 800 mg by mouth every 8 (eight) hours as needed for moderate pain.    levETIRAcetam 1000 MG tablet Commonly known as: KEPPRA Take 1,000 mg by mouth 2 (two) times daily.    metFORMIN 500 MG 24 hr tablet Commonly known as: GLUCOPHAGE-XR Take 500 mg by mouth 2 (two) times daily.    methylphenidate 10 MG tablet Commonly known as: RITALIN Take by mouth.    midodrine 10 MG tablet Commonly known as: PROAMATINE Take 10 mg by mouth 2 (two) times daily.    modafinil 200 MG tablet Commonly known as: PROVIGIL Take 400 mg by mouth every morning.    multivitamin with minerals Tabs tablet Take 1 tablet by mouth  daily.    nabumetone 750 MG tablet Commonly known as: RELAFEN Take 750 mg by mouth 2 (two) times daily.    omeprazole 40 MG capsule Commonly known as: PRILOSEC Take 80 mg by mouth at bedtime.    ondansetron 4 MG tablet Commonly known as: ZOFRAN Take 1 tablet (4 mg total) by mouth every 6 (six) hours.    oxybutynin 5 MG tablet Commonly known as: DITROPAN Take 5 mg by mouth 2 (two) times daily.    SUMAtriptan 50 MG tablet Commonly known as: IMITREX Take 50-100 mg by mouth every 2 (two) hours as needed for migraine. May repeat in 2 hours if headache persists or recurs.    SYSTANE OP Place 1 drop into both eyes 2 (two) times daily.    topiramate 100 MG tablet Commonly known as: TOPAMAX Take 100 mg by mouth 2 (two) times daily.    venlafaxine XR 150 MG 24 hr capsule Commonly known as: EFFEXOR-XR Take 300 mg by mouth daily.    zolpidem 5 MG tablet Commonly known as: AMBIEN Take 5 mg by mouth at bedtime as needed for sleep.             Allergies:       Allergies  Allergen Reactions   Dilaudid [Hydromorphone Hcl] Palpitations and Rash   Azithromycin Nausea And Vomiting   Other        Dawn dish detergent-RASH   Tape Other (See Comments)      Blister   Erythromycin Nausea Only   Hydromorphone Hcl Palpitations and Rash   Lyrica [Pregabalin] Swelling and Rash   Penicillins Nausea Only   Reglan [Metoclopramide] Palpitations   Sulfonamide Derivatives Rash      Family History:      Family History  Problem Relation Age of Onset   Coronary artery disease Other        Social History:  reports that she quit smoking about 34 years ago. Her smoking use included cigarettes. She has a 2.00 pack-year smoking history. She has never used smokeless tobacco. She reports that she does not drink alcohol and does not use drugs.   ROS: All other review of systems were reviewed and are negative except what is noted above in HPI   Physical Exam: BP 123/70   Pulse (!) 111    Constitutional:  Alert and oriented, No  acute distress. HEENT: [place] AT, moist mucus membranes.  Trachea midline, no masses. Cardiovascular: No clubbing, cyanosis, or edema. Respiratory: Normal respiratory effort, no increased work of breathing. GI: Abdomen is soft, nontender, nondistended, no abdominal masses GU: No CVA tenderness.  Lymph: No cervical or inguinal lymphadenopathy. Skin: No rashes, bruises or suspicious lesions. Neurologic: Grossly intact, no focal deficits, moving all 4 extremities. Psychiatric: Normal mood and affect.   Laboratory Data: Recent Labs       Lab Results  Component Value Date    WBC 6.6 05/03/2021    HGB 12.3 05/03/2021    HCT 39.4 05/03/2021    MCV 93.1 05/03/2021    PLT 241 05/03/2021        Recent Labs       Lab Results  Component Value Date    CREATININE 0.74 05/03/2021        Recent Labs  No results found for: "PSA"     Recent Labs  No results found for: "TESTOSTERONE"     Recent Labs        Lab Results  Component Value Date    HGBA1C (H) 06/27/2008      7.5 (NOTE)   The ADA recommends the following therapeutic goal for glycemic   control related to Hgb A1C measurement:   Goal of Therapy:   < 7.0% Hgb A1C   Reference: American Diabetes Association: Clinical Practice   Recommendations 2008, Diabetes Care,  2008, 31:(Suppl 1).        Urinalysis Labs (Brief)          Component Value Date/Time    COLORURINE STRAW (A) 01/29/2020 1508    APPEARANCEUR CLEAR 01/29/2020 1508    LABSPEC 1.011 01/29/2020 1508    PHURINE 6.0 01/29/2020 1508    GLUCOSEU NEGATIVE 01/29/2020 1508    HGBUR MODERATE (A) 01/29/2020 1508    BILIRUBINUR NEGATIVE 01/29/2020 1508    KETONESUR NEGATIVE 01/29/2020 1508    PROTEINUR NEGATIVE 01/29/2020 1508    UROBILINOGEN 2.0 (H) 06/19/2008 1206    NITRITE NEGATIVE 01/29/2020 1508    LEUKOCYTESUR NEGATIVE 01/29/2020 1508        Recent Labs       Lab Results  Component Value Date    BACTERIA NONE  SEEN 01/29/2020        Pertinent Imaging: CT 10/17/2022 and KUB today: Images reviewed and discussed with the patient No results found for this or any previous visit.   No results found for this or any previous visit.   No results found for this or any previous visit.   No results found for this or any previous visit.   No results found for this or any previous visit.   No valid procedures specified. No results found for this or any previous visit.   No results found for this or any previous visit.     Assessment & Plan:     1. Kidney stones -We discussed the management of kidney stones. These options include observation, ureteroscopy, shockwave lithotripsy (ESWL) and percutaneous nephrolithotomy (PCNL). We discussed which options are relevant to the patient's stone(s). We discussed the natural history of kidney stones as well as the complications of untreated stones and the impact on quality of life without treatment as well as with each of the above listed treatments. We also discussed the efficacy of each treatment in its ability to clear the stone burden. With any of these management options I discussed the signs and symptoms of infection and the  need for emergent treatment should these be experienced. For each option we discussed the ability of each procedure to clear the patient of their stone burden.   For observation I described the risks which include but are not limited to silent renal damage, life-threatening infection, need for emergent surgery, failure to pass stone and pain.   For ureteroscopy I described the risks which include bleeding, infection, damage to contiguous structures, positioning injury, ureteral stricture, ureteral avulsion, ureteral injury, need for prolonged ureteral stent, inability to perform ureteroscopy, need for an interval procedure, inability to clear stone burden, stent discomfort/pain, heart attack, stroke, pulmonary embolus and the inherent risks  with general anesthesia.   For shockwave lithotripsy I described the risks which include arrhythmia, kidney contusion, kidney hemorrhage, need for transfusion, pain, inability to adequately break up stone, inability to pass stone fragments, Steinstrasse, infection associated with obstructing stones, need for alternate surgical procedure, need for repeat shockwave lithotripsy, MI, CVA, PE and the inherent risks with anesthesia/conscious sedation.   For PCNL I described the risks including positioning injury, pneumothorax, hydrothorax, need for chest tube, inability to clear stone burden, renal laceration, arterial venous fistula or malformation, need for embolization of kidney, loss of kidney or renal function, need for repeat procedure, need for prolonged nephrostomy tube, ureteral avulsion, MI, CVA, PE and the inherent risks of general anesthesia.   - The patient would like to proceed with right PCNL.

## 2022-12-22 NOTE — Progress Notes (Signed)
   12/22/22 2006  Assess: MEWS Score  Temp 98.9 F (37.2 C)  BP (!) 127/97  MAP (mmHg) 103  Pulse Rate (!) 121  Resp 16  SpO2 96 %  O2 Device Room Air  Assess: MEWS Score  MEWS Temp 0  MEWS Systolic 0  MEWS Pulse 2  MEWS RR 0  MEWS LOC 0  MEWS Score 2  MEWS Score Color Yellow  Assess: if the MEWS score is Yellow or Red  Were vital signs taken at a resting state? Yes  Focused Assessment Change from prior assessment (see assessment flowsheet)  Does the patient meet 2 or more of the SIRS criteria? No  MEWS guidelines implemented  Yes, yellow  Treat  MEWS Interventions Considered administering scheduled or prn medications/treatments as ordered  Take Vital Signs  Increase Vital Sign Frequency  Yellow: Q2hr x1, continue Q4hrs until patient remains green for 12hrs  Escalate  MEWS: Escalate Yellow: Discuss with charge nurse and consider notifying provider and/or RRT  Notify: Charge Nurse/RN  Name of Charge Nurse/RN Notified [place], RN  Assess: SIRS CRITERIA  SIRS Temperature  0  SIRS Pulse 1  SIRS Respirations  0  SIRS WBC 0  SIRS Score Sum  1

## 2022-12-22 NOTE — Op Note (Addendum)
Preoperative diagnosis: Right renal stone  Postoperative diagnosis: Same  Procedure 1.  Right percutaneous nephrostolithotomy for stone greater than 2 cm 2.  Right nephrostogram 3.  Intraoperative fluoroscopy, under 1 hour, with interpretation 4.  Placement of a 6 x 26 double-J ureteral stent. 5.  Placement of a 20 French nephrostomy tube 6.  Dilation of percutaneous tract  Attending: Nicolette Bang, MD  Anesthesia: General  Estimated blood loss: 250cc  Antibiotics: Ancef  Drains: 1.  16 French Foley catheter 2.  6 x 26 right double-J ureteral stent 3.  20 French nephrostomy tube  Specimens: Stone for analysis  Findings: numerous renal pelvis calculi. 2.5cm UPJ calculus. Limited drainage of contrast down the ureter prior to removing UPJ calculus. Minimal extravasation following PCNL  Indications: Patient is a 67 year old female with a history of large right renal stones.  After discussing treatment options and decided she was right percutaneous nephrostolithotomy.  Patient already has a nephrostomy tube.  Procedure in detail: Prior to procedure consent was obtained.  Patient was brought to the operating room debridement was done to ensure correct patient, correct procedure, and correct site.  General anesthesia was administered.  A 16 French Foley catheter was in place.  The patient was then placed in the prone position.  His nephrostomy tube and right flank was then prepped and draped in usual sterile fashion.  A nephrostogram was obtained and findings noted above.  Through the nephrostomy tube we then placed a sensor wire.  Sensor wire was coiled in the renal pelvis we then removed the nephrostomy tube.  We then made an incision at the level of the skin and over the wire we then placed a NephroMax dilator.  We dilated the nephrostomy tract to 30 [place] and held this 18 cm of water for 1 minute.  We then  placed the access sheath over the balloon.  The balloon was then deflated.  We  then used a rigid nephroscope to perform nephroscopy.  We encountered numerous large renal pelvis calculi and large UPJ calculus.  We then used a trilogy to fragment the stone in multiple pieces.   We then removed the nephroscope and over the wire placed a 6 x 26 double-J ureteral stent.  The wire was then removed and good coil was noted in the renal pelvis under direct vision in the bladder under fluoroscopy.  We then placed a 20 French nephrostomy tube through the sheath into the renal pelvis.  The balloon was inflated with 3 amounts of contrast.  We then removed the access sheath in and obtain another nephrostogram.  We noted minimal extravasation of contrast.  We then secured the nephrostomy tubes with 0 silks in interrupted fashion.  Dressing was placed over the nephrostomy tube site and this then concluded the procedure was well-tolerated by the patient.  Complications: None  Condition: Stable, extubated, transferred to PACU  Plan: Patient is to be admitted overnight for observation.  Her Foley catheter through the morning. Nephrostomy tube will be removed in 2 days. She is then to be discharged home and followup in 1 week for stent removal

## 2022-12-22 NOTE — Progress Notes (Signed)
   12/22/22 2220  Assess: MEWS Score  Temp 100 F (37.8 C)  BP 135/72  MAP (mmHg) 90  Pulse Rate (!) 124  Resp 18  SpO2 96 %  O2 Device Room Air  Assess: MEWS Score  MEWS Temp 0  MEWS Systolic 0  MEWS Pulse 2  MEWS RR 0  MEWS LOC 0  MEWS Score 2  MEWS Score Color Yellow  Assess: if the MEWS score is Yellow or Red  Were vital signs taken at a resting state? Yes  Focused Assessment No change from prior assessment  Does the patient meet 2 or more of the SIRS criteria? No  MEWS guidelines implemented  Yes, yellow  Treat  MEWS Interventions Considered administering scheduled or prn medications/treatments as ordered  Take Vital Signs  Increase Vital Sign Frequency  Yellow: Q2hr x1, continue Q4hrs until patient remains green for 12hrs  Escalate  MEWS: Escalate Yellow: Discuss with charge nurse and consider notifying provider and/or RRT  Notify: Charge Nurse/RN  Name of Charge Nurse/RN Notified [place], RN  Assess: SIRS CRITERIA  SIRS Temperature  0  SIRS Pulse 1  SIRS Respirations  0  SIRS WBC 0  SIRS Score Sum  1

## 2022-12-22 NOTE — Progress Notes (Signed)
Pt arrived to room 308 via stretcher from PACU. Pt awake and oriented x4, complaining of right lower abd and right flank pain rated 9/10. Pt was medicated in PACU prior to transport, pt denies any relief. Pt also c/o feeling of urge to urinate, pt reminded of foley cath intact, she states that she is still feeling a lot of pressure and urge. Foley draining clear pink to light red urine. Dressing intact to right flank, old drainage marked by PACU and new small area of bloody drainage marked by this nurse. Pt oriented to room and safety procedures, states understanding. Bed in low position, bed alarm on and call bell within reach. Advised to call for needs.  Pt now complaining of nausea. Primary nurse aware and advised pt that she will be bringing med for both pain and nausea relief. Pt states understanding.

## 2022-12-22 NOTE — Transfer of Care (Signed)
Immediate Anesthesia Transfer of Care Note  Patient: Cindy Norris  Procedure(s) Performed: NEPHROLITHOTOMY PERCUTANEOUS (Right: Back)  Patient Location: PACU  Anesthesia Type:General  Level of Consciousness: awake, alert , and patient cooperative  Airway & Oxygen Therapy: Patient Spontanous Breathing and Patient connected to face mask oxygen  Post-op Assessment: Report given to RN, Post -op Vital signs reviewed and stable, and Patient moving all extremities  Post vital signs: Reviewed and stable  Last Vitals:  Vitals Value Taken Time  BP 156/89 12/22/22 0919  Temp 97.7 12/22/22 0919  Pulse 99 12/22/22 0925  Resp 16 12/22/22 0925  SpO2 100 % 12/22/22 0925  Vitals shown include unvalidated device data.  Last Pain:  Vitals:   12/22/22 0701  TempSrc: Oral  PainSc: 5       Patients Stated Pain Goal: 7 ([id number])  Complications: No notable events documented.

## 2022-12-22 NOTE — Anesthesia Postprocedure Evaluation (Signed)
Anesthesia Post Note  Patient: Cindy Norris  Procedure(s) Performed: NEPHROLITHOTOMY PERCUTANEOUS (Right: Renal)  Patient location during evaluation: PACU Anesthesia Type: General Level of consciousness: awake and alert and oriented Pain management: pain level controlled Vital Signs Assessment: post-procedure vital signs reviewed and stable Respiratory status: spontaneous breathing, nonlabored ventilation and respiratory function stable Cardiovascular status: blood pressure returned to baseline and stable Postop Assessment: no apparent nausea or vomiting Anesthetic complications: no  No notable events documented.   Last Vitals:  Vitals:   12/22/22 1100 12/22/22 1123  BP: (!) 153/83 (!) 157/96  Pulse: (!) 107 (!) 108  Resp: 12 18  Temp: 36.7 C 36.7 C  SpO2: 99% 98%    Last Pain:  Vitals:   12/22/22 1123  TempSrc: Oral  PainSc: 9                  Stephani Janak C Merrit Friesen

## 2022-12-23 ENCOUNTER — Other Ambulatory Visit: Payer: Self-pay | Admitting: Urology

## 2022-12-23 ENCOUNTER — Encounter (HOSPITAL_COMMUNITY): Payer: Self-pay | Admitting: Radiology

## 2022-12-23 DIAGNOSIS — Z7982 Long term (current) use of aspirin: Secondary | ICD-10-CM | POA: Diagnosis not present

## 2022-12-23 DIAGNOSIS — N200 Calculus of kidney: Secondary | ICD-10-CM

## 2022-12-23 DIAGNOSIS — Z96653 Presence of artificial knee joint, bilateral: Secondary | ICD-10-CM | POA: Diagnosis not present

## 2022-12-23 DIAGNOSIS — Z79899 Other long term (current) drug therapy: Secondary | ICD-10-CM | POA: Diagnosis not present

## 2022-12-23 DIAGNOSIS — E119 Type 2 diabetes mellitus without complications: Secondary | ICD-10-CM | POA: Diagnosis not present

## 2022-12-23 DIAGNOSIS — Z87891 Personal history of nicotine dependence: Secondary | ICD-10-CM | POA: Diagnosis not present

## 2022-12-23 DIAGNOSIS — J45909 Unspecified asthma, uncomplicated: Secondary | ICD-10-CM | POA: Diagnosis not present

## 2022-12-23 LAB — CBC
HCT: 30 % — ABNORMAL LOW (ref 36.0–46.0)
Hemoglobin: 9.4 g/dL — ABNORMAL LOW (ref 12.0–15.0)
MCH: 28.7 pg (ref 26.0–34.0)
MCHC: 31.3 g/dL (ref 30.0–36.0)
MCV: 91.7 fL (ref 80.0–100.0)
Platelets: 250 10*3/uL (ref 150–400)
RBC: 3.27 MIL/uL — ABNORMAL LOW (ref 3.87–5.11)
RDW: 13.1 % (ref 11.5–15.5)
WBC: 11.9 10*3/uL — ABNORMAL HIGH (ref 4.0–10.5)
nRBC: 0 % (ref 0.0–0.2)

## 2022-12-23 LAB — BASIC METABOLIC PANEL
Anion gap: 5 (ref 5–15)
BUN: 17 mg/dL (ref 8–23)
CO2: 24 mmol/L (ref 22–32)
Calcium: 8.7 mg/dL — ABNORMAL LOW (ref 8.9–10.3)
Chloride: 112 mmol/L — ABNORMAL HIGH (ref 98–111)
Creatinine, Ser: 0.76 mg/dL (ref 0.44–1.00)
GFR, Estimated: >60 mL/min (ref >60)
Glucose, Bld: 239 mg/dL — ABNORMAL HIGH (ref 70–99)
Potassium: 3.2 mmol/L — ABNORMAL LOW (ref 3.5–5.1)
Sodium: 141 mmol/L (ref 135–145)

## 2022-12-23 LAB — GLUCOSE, CAPILLARY: Glucose-Capillary: 239 mg/dL — ABNORMAL HIGH (ref 70–99)

## 2022-12-23 MED ORDER — OXYCODONE-ACETAMINOPHEN 5-325 MG PO TABS: 1.0000 | ORAL_TABLET | ORAL | 0 refills | Status: AC | PRN

## 2022-12-23 NOTE — Progress Notes (Signed)
   12/23/22 0615  Assess: MEWS Score  Temp 99.3 F (37.4 C)  BP 135/74  MAP (mmHg) 91  Pulse Rate (!) 126  Resp 18  SpO2 94 %  O2 Device Room Air  Assess: MEWS Score  MEWS Temp 0  MEWS Systolic 0  MEWS Pulse 2  MEWS RR 0  MEWS LOC 0  MEWS Score 2  MEWS Score Color Yellow  Assess: if the MEWS score is Yellow or Red  Were vital signs taken at a resting state? Yes  Focused Assessment No change from prior assessment  Does the patient meet 2 or more of the SIRS criteria? No  MEWS guidelines implemented  Yes, yellow  Treat  MEWS Interventions Considered administering scheduled or prn medications/treatments as ordered  Take Vital Signs  Increase Vital Sign Frequency  Yellow: Q2hr x1, continue Q4hrs until patient remains green for 12hrs  Escalate  MEWS: Escalate Yellow: Discuss with charge nurse and consider notifying provider and/or RRT  Notify: Charge Nurse/RN  Name of Charge Nurse/RN Notified [place], RN  Assess: SIRS CRITERIA  SIRS Temperature  0  SIRS Pulse 1  SIRS Respirations  0  SIRS WBC 0  SIRS Score Sum  1

## 2022-12-23 NOTE — Progress Notes (Signed)
   12/23/22 0316  Assess: MEWS Score  Temp 98.8 F (37.1 C)  BP (!) 140/72  MAP (mmHg) 91  Pulse Rate (!) 118  Resp 18  SpO2 98 %  O2 Device Room Air  Assess: MEWS Score  MEWS Temp 0  MEWS Systolic 0  MEWS Pulse 2  MEWS RR 0  MEWS LOC 0  MEWS Score 2  MEWS Score Color Yellow  Assess: if the MEWS score is Yellow or Red  Were vital signs taken at a resting state? Yes  Focused Assessment No change from prior assessment  Does the patient meet 2 or more of the SIRS criteria? No  MEWS guidelines implemented  Yes, yellow  Treat  MEWS Interventions Considered administering scheduled or prn medications/treatments as ordered  Take Vital Signs  Increase Vital Sign Frequency  Yellow: Q2hr x1, continue Q4hrs until patient remains green for 12hrs  Escalate  MEWS: Escalate Yellow: Discuss with charge nurse and consider notifying provider and/or RRT  Notify: Charge Nurse/RN  Name of Charge Nurse/RN Notified [place], RN  Assess: SIRS CRITERIA  SIRS Temperature  0  SIRS Pulse 1  SIRS Respirations  0  SIRS WBC 0  SIRS Score Sum  1   Pt's HR has continued to be elevated throughout the night. MD made aware.

## 2022-12-23 NOTE — Progress Notes (Signed)
  Transition of Care [facility]) Screening Note   Patient Details  Name: Cindy Norris Date of Birth: [birth year]   Transition of Care [facility]) CM/SW Contact:    Iona Beard, [place] Phone Number: 12/23/2022, 8:38 AM    Transition of Care Department [facility]) has reviewed patient and no TOC needs have been identified at this time. We will continue to monitor patient advancement through interdisciplinary progression rounds. If new patient transition needs arise, please place a TOC consult.

## 2022-12-28 LAB — CALCULI, WITH PHOTOGRAPH (CLINICAL LAB)
Calcium Oxalate Dihydrate: 60 %
Calcium Oxalate Monohydrate: 20 %
Hydroxyapatite: 20 %
Weight Calculi: 4387 mg

## 2022-12-30 DIAGNOSIS — Z79899 Other long term (current) drug therapy: Secondary | ICD-10-CM | POA: Diagnosis not present

## 2022-12-30 DIAGNOSIS — Z6834 Body mass index (BMI) 34.0-34.9, adult: Secondary | ICD-10-CM | POA: Diagnosis not present

## 2022-12-30 DIAGNOSIS — M797 Fibromyalgia: Secondary | ICD-10-CM | POA: Diagnosis not present

## 2022-12-30 DIAGNOSIS — E119 Type 2 diabetes mellitus without complications: Secondary | ICD-10-CM | POA: Diagnosis not present

## 2022-12-30 DIAGNOSIS — M5137 Other intervertebral disc degeneration, lumbosacral region: Secondary | ICD-10-CM | POA: Diagnosis not present

## 2023-01-02 ENCOUNTER — Encounter (HOSPITAL_COMMUNITY): Payer: Self-pay | Admitting: Urology

## 2023-01-02 ENCOUNTER — Ambulatory Visit (INDEPENDENT_AMBULATORY_CARE_PROVIDER_SITE_OTHER): Payer: Medicare HMO | Admitting: Urology

## 2023-01-02 VITALS — BP 128/76 | HR 125

## 2023-01-02 DIAGNOSIS — M5137 Other intervertebral disc degeneration, lumbosacral region: Secondary | ICD-10-CM | POA: Diagnosis not present

## 2023-01-02 DIAGNOSIS — M2351 Chronic instability of knee, right knee: Secondary | ICD-10-CM | POA: Diagnosis not present

## 2023-01-02 DIAGNOSIS — Z96651 Presence of right artificial knee joint: Secondary | ICD-10-CM | POA: Diagnosis not present

## 2023-01-02 DIAGNOSIS — M533 Sacrococcygeal disorders, not elsewhere classified: Secondary | ICD-10-CM | POA: Diagnosis not present

## 2023-01-02 DIAGNOSIS — Z79899 Other long term (current) drug therapy: Secondary | ICD-10-CM | POA: Diagnosis not present

## 2023-01-02 DIAGNOSIS — N200 Calculus of kidney: Secondary | ICD-10-CM

## 2023-01-02 LAB — URINALYSIS, ROUTINE W REFLEX MICROSCOPIC
Bilirubin, UA: NEGATIVE
Ketones, UA: NEGATIVE
Nitrite, UA: NEGATIVE
Specific Gravity, UA: 1.03 (ref 1.005–1.030)
Urobilinogen, Ur: 0.2 mg/dL (ref 0.2–1.0)
pH, UA: 5.5 (ref 5.0–7.5)

## 2023-01-02 LAB — MICROSCOPIC EXAMINATION
Bacteria, UA: NONE SEEN
RBC, Urine: >30 /hpf — AB (ref 0–2)

## 2023-01-02 NOTE — Progress Notes (Unsigned)
01/02/2023 9:44 AM   Cindy Norris 1955-10-17 [id number]  Referring provider: Practice, [facility] [address] [place],  [place] [address]  No chief complaint on file.   HPI:    PMH: Past Medical History:  Diagnosis Date   Asthma    Chronic pain syndrome    Degenerative disc disease, lumbar    Depression    Diabetes mellitus    GERD (gastroesophageal reflux disease)    History of kidney stones    Hyperlipidemia, mixed    Migraines    Narcolepsy    Osteoarthritis    Overweight(278.02)    Obesity   Seizures (HCC)    unknown etiology-on meds-last seizure was several years ago   Unspecified essential hypertension     Surgical History: Past Surgical History:  Procedure Laterality Date   BREAST MASS EXCISION Right    CATARACT EXTRACTION W/PHACO Left 05/09/2022   Procedure: CATARACT EXTRACTION PHACO AND INTRAOCULAR LENS PLACEMENT (IOC);  Surgeon: Fabio Pierce, MD;  Location: [facility];  Service: Ophthalmology;  Laterality: Left;  CDE: 10.22   CATARACT EXTRACTION W/PHACO Right 05/23/2022   Procedure: CATARACT EXTRACTION PHACO AND INTRAOCULAR LENS PLACEMENT (IOC);  Surgeon: Fabio Pierce, MD;  Location: [facility];  Service: Ophthalmology;  Laterality: Right;  CDE 7.15   IR NEPHROURETERAL CATH PLACE RIGHT  12/21/2022   KNEE ARTHROSCOPY     PILONIDAL CYST EXCISION     SHOULDER SURGERY Left    TOTAL KNEE ARTHROPLASTY Bilateral     Home Medications:  Allergies as of 01/02/2023       Reactions   Dilaudid [hydromorphone Hcl] Palpitations, Rash   Azithromycin Nausea And Vomiting   Other    Dawn dish detergent-RASH   Tape Other (See Comments)   Blister   Erythromycin Nausea Only   Lyrica [pregabalin] Swelling, Rash   Penicillins Nausea Only   Reglan [metoclopramide] Palpitations   Sulfonamide Derivatives Rash        Medication List        Accurate as of January 02, 2023  9:44 AM. If you have any questions, ask your nurse or doctor.          albuterol 108 (90  Base) MCG/ACT inhaler Commonly known as: VENTOLIN HFA Inhale 1-2 puffs into the lungs as needed for wheezing or shortness of breath.   atorvastatin 80 MG tablet Commonly known as: LIPITOR Take 80 mg by mouth at bedtime.   busPIRone 10 MG tablet Commonly known as: BUSPAR Take 10 mg by mouth 2 (two) times daily.   cephALEXin 500 MG capsule Commonly known as: KEFLEX Take 500-1,000 mg by mouth See admin instructions. Take 1000 mg in the morning and 500 mg at night   Dulaglutide 1.5 MG/0.5ML Sopn Inject 1.5 mg into the skin every Wednesday.   ezetimibe 10 MG tablet Commonly known as: ZETIA Take 10 mg by mouth daily.   gabapentin 800 MG tablet Commonly known as: NEURONTIN Take 800 mg by mouth 3 (three) times daily.   HYDROcodone-acetaminophen 7.5-325 MG tablet Commonly known as: NORCO Take 1 tablet by mouth every 6 (six) hours as needed for moderate pain.   hydrOXYzine 25 MG tablet Commonly known as: ATARAX Take 25 mg by mouth 2 (two) times daily.   ibuprofen 800 MG tablet Commonly known as: ADVIL Take 800 mg by mouth every 8 (eight) hours as needed for moderate pain.   levETIRAcetam 1000 MG tablet Commonly known as: KEPPRA Take 1,000 mg by mouth 2 (two) times daily.  lidocaine 5 % Commonly known as: LIDODERM Place 1 patch onto the skin daily as needed (pain). Remove & Discard patch within 12 hours or as directed by MD   metFORMIN 500 MG 24 hr tablet Commonly known as: GLUCOPHAGE-XR Take 500 mg by mouth 2 (two) times daily.   midodrine 10 MG tablet Commonly known as: PROAMATINE Take 15 mg by mouth 3 (three) times daily.   modafinil 200 MG tablet Commonly known as: PROVIGIL Take 400 mg by mouth every morning.   multivitamin with minerals Tabs tablet Take 1 tablet by mouth daily.   nabumetone 750 MG tablet Commonly known as: RELAFEN Take 750 mg by mouth 2 (two) times daily.   omeprazole 40 MG capsule Commonly known as: PRILOSEC Take 80 mg by mouth at  bedtime.   oxybutynin 5 MG tablet Commonly known as: DITROPAN Take 5 mg by mouth 3 (three) times daily.   oxyCODONE-acetaminophen 5-325 MG tablet Commonly known as: Percocet Take 1 tablet by mouth every 4 (four) hours as needed for severe pain.   SUMAtriptan 50 MG tablet Commonly known as: IMITREX Take 50-100 mg by mouth every 2 (two) hours as needed for migraine. May repeat in 2 hours if headache persists or recurs.   SYSTANE OP Place 1 drop into both eyes 2 (two) times daily.   topiramate 100 MG tablet Commonly known as: TOPAMAX Take 100 mg by mouth 2 (two) times daily.   venlafaxine XR 150 MG 24 hr capsule Commonly known as: EFFEXOR-XR Take 300 mg by mouth daily.        Allergies:  Allergies  Allergen Reactions   Dilaudid [Hydromorphone Hcl] Palpitations and Rash   Azithromycin Nausea And Vomiting   Other     Dawn dish detergent-RASH   Tape Other (See Comments)    Blister   Erythromycin Nausea Only   Lyrica [Pregabalin] Swelling and Rash   Penicillins Nausea Only   Reglan [Metoclopramide] Palpitations   Sulfonamide Derivatives Rash    Family History: Family History  Problem Relation Age of Onset   Coronary artery disease Other     Social History:  reports that she quit smoking about 34 years ago. Her smoking use included cigarettes. She has a 2.00 pack-year smoking history. She has never used smokeless tobacco. She reports that she does not drink alcohol and does not use drugs.  ROS: All other review of systems were reviewed and are negative except what is noted above in HPI  Physical Exam: BP 128/76   Pulse (!) 125   Constitutional:  Alert and oriented, No acute distress. HEENT: Dent AT, moist mucus membranes.  Trachea midline, no masses. Cardiovascular: No clubbing, cyanosis, or edema. Respiratory: Normal respiratory effort, no increased work of breathing. GI: Abdomen is soft, nontender, nondistended, no abdominal masses GU: No CVA tenderness.   Lymph: No cervical or inguinal lymphadenopathy. Skin: No rashes, bruises or suspicious lesions. Neurologic: Grossly intact, no focal deficits, moving all 4 extremities. Psychiatric: Normal mood and affect.  Laboratory Data: Lab Results  Component Value Date   WBC 11.9 (H) 12/23/2022   HGB 9.4 (L) 12/23/2022   HCT 30.0 (L) 12/23/2022   MCV 91.7 12/23/2022   PLT 250 12/23/2022    Lab Results  Component Value Date   CREATININE 0.76 12/23/2022    No results found for: "PSA"  No results found for: "TESTOSTERONE"  Lab Results  Component Value Date   HGBA1C 8.0 (H) 12/20/2022    Urinalysis    Component Value Date/Time  COLORURINE STRAW (A) 01/29/2020 1508   APPEARANCEUR CLEAR 01/29/2020 1508   LABSPEC 1.011 01/29/2020 1508   PHURINE 6.0 01/29/2020 1508   GLUCOSEU NEGATIVE 01/29/2020 1508   HGBUR MODERATE (A) 01/29/2020 1508   BILIRUBINUR NEGATIVE 01/29/2020 1508   KETONESUR NEGATIVE 01/29/2020 1508   PROTEINUR NEGATIVE 01/29/2020 1508   UROBILINOGEN 2.0 (H) 06/19/2008 1206   NITRITE NEGATIVE 01/29/2020 1508   LEUKOCYTESUR NEGATIVE 01/29/2020 1508    Lab Results  Component Value Date   BACTERIA NONE SEEN 01/29/2020    Pertinent Imaging: *** Results for orders placed in visit on 11/08/22  DG Abd 1 View  Narrative CLINICAL DATA:  History of kidney stones.  Currently asymptomatic.  EXAM: ABDOMEN - 1 VIEW  COMPARISON:  CT 10/17/2022  FINDINGS: Bowel gas pattern is nonobstructive. There are several air-filled nondilated large and small bowel loops. No free peritoneal air. Evidence of patient's known right-sided nephrolithiasis as the largest stone measures 3 cm over the renal pelvis unchanged. No definitive stones seen over the left kidney. Surgical sutures over the lower abdomen/pelvis compatible with prior abdominal wall hernia repair. Remainder of the exam is unremarkable.  IMPRESSION: 1. Nonobstructive bowel gas pattern. 2. Right-sided  nephrolithiasis unchanged.   Electronically Signed By: Elberta Fortisaniel  Boyle M.D. On: 11/10/2022 14:25  No results found for this or any previous visit.  No results found for this or any previous visit.  No results found for this or any previous visit.  No results found for this or any previous visit.  No valid procedures specified. No results found for this or any previous visit.  No results found for this or any previous visit.   Assessment & Plan:    1. Kidney stones *** - Urinalysis, Routine w reflex microscopic   No follow-ups on file.  Wilkie AyePatrick Bynum Mccullars, MD  [facility]

## 2023-01-03 NOTE — Discharge Summary (Signed)
Physician Discharge Summary  Patient ID: Cindy Norris MRN: [id number] DOB/AGE: 1956-08-20 67 y.o.  Admit date: 12/22/2022 Discharge date:12/23/2022  Admission Diagnoses:  Renal calculus, right  Discharge Diagnoses:  Principal Problem:   Renal calculus, right   Past Medical History:  Diagnosis Date   Asthma    Chronic pain syndrome    Degenerative disc disease, lumbar    Depression    Diabetes mellitus    GERD (gastroesophageal reflux disease)    History of kidney stones    Hyperlipidemia, mixed    Migraines    Narcolepsy    Osteoarthritis    Overweight(278.02)    Obesity   Seizures    unknown etiology-on meds-last seizure was several years ago   Unspecified essential hypertension     Surgeries: Procedure(s): NEPHROLITHOTOMY PERCUTANEOUS on 12/22/2022   Consultants (if any):   Discharged Condition: Improved  Hospital Course: Cindy Norris is an 67 y.o. female who was admitted 12/22/2022 with a diagnosis of Renal calculus, right and went to the operating room on 12/22/2022 and underwent the above named procedures.    She was given perioperative antibiotics:  Anti-infectives (From admission, onward)    Start     Dose/Rate Route Frequency Ordered Stop   12/22/22 0639  ceFAZolin (ANCEF) 2-4 GM/100ML-% IVPB       Note to Pharmacy: Trenton Gammon S: cabinet override      12/22/22 0639 12/22/22 0759   12/22/22 0637  ceFAZolin (ANCEF) IVPB 2g/100 mL premix        2 g 200 mL/hr over 30 Minutes Intravenous 30 min pre-op 12/22/22 0637 12/22/22 0750     .  She was given sequential compression devices, early ambulation for DVT prophylaxis.  She benefited maximally from the hospital stay and there were no complications.    Recent vital signs:  Vitals:   12/23/22 0316 12/23/22 0615  BP: (!) 140/72 135/74  Pulse: (!) 118 (!) 126  Resp: 18 18  Temp: 98.8 F (37.1 C) 99.3 F (37.4 C)  SpO2: 98% 94%    Recent laboratory studies:  Lab Results  Component Value  Date   HGB 9.4 (L) 12/23/2022   HGB 11.5 (L) 12/22/2022   HGB 11.3 (L) 12/21/2022   Lab Results  Component Value Date   WBC 11.9 (H) 12/23/2022   PLT 250 12/23/2022   Lab Results  Component Value Date   INR 1.0 12/21/2022   Lab Results  Component Value Date   NA 141 12/23/2022   K 3.2 (L) 12/23/2022   CL 112 (H) 12/23/2022   CO2 24 12/23/2022   BUN 17 12/23/2022   CREATININE 0.76 12/23/2022   GLUCOSE 239 (H) 12/23/2022    Discharge Medications:   Allergies as of 12/23/2022       Reactions   Dilaudid [hydromorphone Hcl] Palpitations, Rash   Azithromycin Nausea And Vomiting   Other    Dawn dish detergent-RASH   Tape Other (See Comments)   Blister   Erythromycin Nausea Only   Lyrica [pregabalin] Swelling, Rash   Penicillins Nausea Only   Reglan [metoclopramide] Palpitations   Sulfonamide Derivatives Rash        Medication List     TAKE these medications    albuterol 108 (90 Base) MCG/ACT inhaler Commonly known as: VENTOLIN HFA Inhale 1-2 puffs into the lungs as needed for wheezing or shortness of breath.   atorvastatin 80 MG tablet Commonly known as: LIPITOR Take 80 mg by mouth at bedtime.   busPIRone  10 MG tablet Commonly known as: BUSPAR Take 10 mg by mouth 2 (two) times daily.   cephALEXin 500 MG capsule Commonly known as: KEFLEX Take 500-1,000 mg by mouth See admin instructions. Take 1000 mg in the morning and 500 mg at night   Dulaglutide 1.5 MG/0.5ML Sopn Inject 1.5 mg into the skin every Wednesday.   ezetimibe 10 MG tablet Commonly known as: ZETIA Take 10 mg by mouth daily.   gabapentin 800 MG tablet Commonly known as: NEURONTIN Take 800 mg by mouth 3 (three) times daily.   HYDROcodone-acetaminophen 7.5-325 MG tablet Commonly known as: NORCO Take 1 tablet by mouth every 6 (six) hours as needed for moderate pain.   hydrOXYzine 25 MG tablet Commonly known as: ATARAX Take 25 mg by mouth 2 (two) times daily.   ibuprofen 800 MG  tablet Commonly known as: ADVIL Take 800 mg by mouth every 8 (eight) hours as needed for moderate pain.   levETIRAcetam 1000 MG tablet Commonly known as: KEPPRA Take 1,000 mg by mouth 2 (two) times daily.   lidocaine 5 % Commonly known as: LIDODERM Place 1 patch onto the skin daily as needed (pain). Remove & Discard patch within 12 hours or as directed by MD   metFORMIN 500 MG 24 hr tablet Commonly known as: GLUCOPHAGE-XR Take 500 mg by mouth 2 (two) times daily.   midodrine 10 MG tablet Commonly known as: PROAMATINE Take 15 mg by mouth 3 (three) times daily.   modafinil 200 MG tablet Commonly known as: PROVIGIL Take 400 mg by mouth every morning.   multivitamin with minerals Tabs tablet Take 1 tablet by mouth daily.   nabumetone 750 MG tablet Commonly known as: RELAFEN Take 750 mg by mouth 2 (two) times daily.   omeprazole 40 MG capsule Commonly known as: PRILOSEC Take 80 mg by mouth at bedtime.   oxybutynin 5 MG tablet Commonly known as: DITROPAN Take 5 mg by mouth 3 (three) times daily.   oxyCODONE-acetaminophen 5-325 MG tablet Commonly known as: Percocet Take 1 tablet by mouth every 4 (four) hours as needed for severe pain.   SUMAtriptan 50 MG tablet Commonly known as: IMITREX Take 50-100 mg by mouth every 2 (two) hours as needed for migraine. May repeat in 2 hours if headache persists or recurs.   SYSTANE OP Place 1 drop into both eyes 2 (two) times daily.   topiramate 100 MG tablet Commonly known as: TOPAMAX Take 100 mg by mouth 2 (two) times daily.   venlafaxine XR 150 MG 24 hr capsule Commonly known as: EFFEXOR-XR Take 300 mg by mouth daily.        Diagnostic Studies: DG C-Arm 1-60 Min-No Report  Result Date: 12/22/2022 Fluoroscopy was utilized by the requesting physician.  No radiographic interpretation.   IR  NEPHROURETERAL CATH PLACE RIGHT  Result Date: 12/21/2022 INDICATION: Renal stones, access for right-sided percutaneous  nephrolithotomy. EXAM: 1. FLUOROSCOPIC GUIDANCE FOR PUNCTURE OF THE RIGHT SIDED RENAL COLLECTING SYSTEM. 2. FLUOROSCOPIC GUIDED PLACEMENT OF A RIGHT SIDED NEPHROURETERAL CATHETER. COMPARISON:  Abdominal radiograph-11/08/2022; CT abdomen pelvis-10/17/2022 MEDICATIONS: Levofloxacin 500 mg IV; The antibiotic was administered in an appropriate time frame prior to skin puncture. ANESTHESIA/SEDATION: Moderate (conscious) sedation was employed during this procedure. A total of Benadryl 50 mg, Versed 4 mg and Fentanyl 200 mcg was administered intravenously. Moderate Sedation Time: 25 minutes. The patient's level of consciousness and vital signs were monitored continuously by radiology nursing throughout the procedure under my direct supervision. CONTRAST:  88mL OMNIPAQUE IOHEXOL  300 MG/ML SOLN - Administered into the renal collecting system. FLUOROSCOPY TIME:  8 minutes, 12 seconds (104 mGy) COMPLICATIONS: None immediate. PROCEDURE: Informed written consent was obtained from the patient after a discussion of the risks, benefits, and alternatives to treatment. The right flank region was prepped with Betadine in a sterile fashion, and a sterile drape was applied covering the operative field. A sterile gown and sterile gloves were used for the procedure. A timeout was performed prior to the initiation of the procedure. A pre procedural spot fluoroscopic image was obtained of the upper abdomen. Ultrasound scanning performed of the kidney was negative for significant hydronephrosis. As such, the ill-defined stone within the posteroinferior calyx adjacent to dominant stone within the caudal aspect of the right renal pelvis was targeted fluoroscopically. Appropriate access to the collecting system was confirmed with the reflux of a small amount of urine as well as limited contrast injection Next, Nitrex wire was advanced into the superior aspect of the left renal collecting system. The track was dilated with a Accustick set.  Contrast injection confirmed appropriate positioning. Next, via the outer sheath of the Accustick set, a stiff glidewire was utilized to advance a 4 French angled glide catheter to the level of the urinary bladder. Contrast injection confirmed appropriate positioning. An Amplatz wire was coiled within the urinary bladder. The 4 French angled glide catheter was utilized for measurement purposes and under intermittent fluoroscopic guidance, the track was dilated ultimately allowing placement of a 10 French, 22 cm nephroureteral catheter with inferior coil within the urinary bladder and superior aspect partially uncoiled within the renal pelvis and superior aspect of the right ureter. Postprocedural spot fluoroscopic images were obtained in various obliquities. The catheter was flushed with a small amount of saline and connected to a gravity bag. The catheter was secured at the skin entrance site within interrupted suture and a dressing was applied. The patient tolerated the procedure well without immediate postprocedural complication. FINDINGS: Pre procedural spot radiographic images demonstrates unchanged size and appearance of the staghorn calculus overlies expected location of the caudal aspect of the right renal pelvis with ill-defined find components of the calculus extending to adjacent calices. Ultrasound scanning was negative for significant hydronephrosis and as such, the stone ill-defined component of the staghorn calculus extending to involve a posteroinferior calyx was targeted fluoroscopically allowing access to the collecting system, ultimately with placement of a 10 French, 22 cm nephroureteral catheter with inferior coil within the urinary bladder and aspect slightly uncoiled within caudal aspect of the renal pelvis and superior aspect of the right ureter. IMPRESSION: Successful fluoroscopic guided placement of a right-sided 10 French, 22 cm nephroureteral catheter with inferior coil within the urinary  bladder and superior aspect slightly uncoiled within the right renal pelvis and superior aspect of the right ureter to be utilized during impending nephrolithotomy procedure scheduled [facility] tomorrow at [facility]. The nephroureteral catheter was connected to a gravity bag to allow for maximum urinary drainage overnight prior to her impending nephrolithotomy procedure. Electronically Signed   By: Simonne ComeJohn  Watts M.D.   On: 12/21/2022 17:44    Disposition: Discharge disposition: 01-Home or Self Care       Discharge Instructions     Discharge patient   Complete by: As directed    Discharge disposition: 01-Home or Self Care   Discharge patient date: 12/23/2022        Follow-up Information     Malen GauzeMcKenzie, Lorely Bubb L, MD Follow up on 01/02/2023.   Specialty:  Urology Why: 9:10am Contact information: [address] [place] [address] [phone]                  Signed: Wilkie Aye 01/03/2023, 9:57 AM

## 2023-01-04 ENCOUNTER — Ambulatory Visit (INDEPENDENT_AMBULATORY_CARE_PROVIDER_SITE_OTHER): Payer: Medicare HMO | Admitting: Urology

## 2023-01-04 VITALS — BP 129/74 | HR 120

## 2023-01-04 DIAGNOSIS — Z466 Encounter for fitting and adjustment of urinary device: Secondary | ICD-10-CM

## 2023-01-04 DIAGNOSIS — N200 Calculus of kidney: Secondary | ICD-10-CM

## 2023-01-04 LAB — URINALYSIS, ROUTINE W REFLEX MICROSCOPIC
Bilirubin, UA: NEGATIVE
Nitrite, UA: NEGATIVE
Specific Gravity, UA: 1.025 (ref 1.005–1.030)
Urobilinogen, Ur: 1 mg/dL (ref 0.2–1.0)
pH, UA: 6 (ref 5.0–7.5)

## 2023-01-04 LAB — MICROSCOPIC EXAMINATION
Bacteria, UA: NONE SEEN
RBC, Urine: >30 /hpf — AB (ref 0–2)

## 2023-01-04 MED ORDER — CIPROFLOXACIN HCL 500 MG PO TABS
500.0000 mg | ORAL_TABLET | Freq: Once | ORAL | Status: AC
  Administered 2023-01-04: 500 mg via ORAL

## 2023-01-04 NOTE — Progress Notes (Unsigned)
   01/04/23  CC: followup nephrolithiasis  HPI: Cindy Norris is a 66yo here for followup for nephrolithiasis Blood pressure 129/74, pulse (!) 120. NED. A&Ox3.   No respiratory distress   Abd soft, NT, ND Normal external genitalia with patent urethral meatus  Cystoscopy Procedure Note  Patient identification was confirmed, informed consent was obtained, and patient was prepped using Betadine solution.  Lidocaine jelly was administered per urethral meatus.    Procedure: - Flexible cystoscope introduced, without any difficulty.   - Thorough search of the bladder revealed:    normal urethral meatus    normal urothelium    no stones    no ulcers     no tumors    no urethral polyps    no trabeculation  - Ureteral orifices were normal in position and appearance. -Using a grasper the right ureteral stent was removed intact  Post-Procedure: - Patient tolerated the procedure well  Assessment/ Plan: Followup 6 weeks with a renal US   No follow-ups on file.  Wilkie Aye, MD

## 2023-01-04 NOTE — Patient Instructions (Signed)

## 2023-01-18 DIAGNOSIS — Z1231 Encounter for screening mammogram for malignant neoplasm of breast: Secondary | ICD-10-CM | POA: Diagnosis not present

## 2023-01-22 DIAGNOSIS — N200 Calculus of kidney: Secondary | ICD-10-CM | POA: Diagnosis not present

## 2023-01-22 DIAGNOSIS — E1165 Type 2 diabetes mellitus with hyperglycemia: Secondary | ICD-10-CM | POA: Diagnosis not present

## 2023-01-22 DIAGNOSIS — Z6833 Body mass index (BMI) 33.0-33.9, adult: Secondary | ICD-10-CM | POA: Diagnosis not present

## 2023-01-24 DIAGNOSIS — I10 Essential (primary) hypertension: Secondary | ICD-10-CM | POA: Diagnosis not present

## 2023-01-24 DIAGNOSIS — E1165 Type 2 diabetes mellitus with hyperglycemia: Secondary | ICD-10-CM | POA: Diagnosis not present

## 2023-01-24 DIAGNOSIS — E782 Mixed hyperlipidemia: Secondary | ICD-10-CM | POA: Diagnosis not present

## 2023-01-30 DIAGNOSIS — M797 Fibromyalgia: Secondary | ICD-10-CM | POA: Diagnosis not present

## 2023-01-30 DIAGNOSIS — Z6834 Body mass index (BMI) 34.0-34.9, adult: Secondary | ICD-10-CM | POA: Diagnosis not present

## 2023-01-30 DIAGNOSIS — E119 Type 2 diabetes mellitus without complications: Secondary | ICD-10-CM | POA: Diagnosis not present

## 2023-01-30 DIAGNOSIS — M5137 Other intervertebral disc degeneration, lumbosacral region: Secondary | ICD-10-CM | POA: Diagnosis not present

## 2023-01-30 DIAGNOSIS — Z79899 Other long term (current) drug therapy: Secondary | ICD-10-CM | POA: Diagnosis not present

## 2023-02-01 DIAGNOSIS — Z79899 Other long term (current) drug therapy: Secondary | ICD-10-CM | POA: Diagnosis not present

## 2023-02-03 ENCOUNTER — Ambulatory Visit (HOSPITAL_COMMUNITY): Payer: Medicare HMO | Attending: Urology

## 2023-02-03 DIAGNOSIS — M545 Low back pain, unspecified: Secondary | ICD-10-CM | POA: Diagnosis not present

## 2023-02-03 DIAGNOSIS — F419 Anxiety disorder, unspecified: Secondary | ICD-10-CM | POA: Diagnosis not present

## 2023-02-03 DIAGNOSIS — F332 Major depressive disorder, recurrent severe without psychotic features: Secondary | ICD-10-CM | POA: Diagnosis not present

## 2023-02-03 DIAGNOSIS — R030 Elevated blood-pressure reading, without diagnosis of hypertension: Secondary | ICD-10-CM | POA: Diagnosis not present

## 2023-02-03 DIAGNOSIS — K219 Gastro-esophageal reflux disease without esophagitis: Secondary | ICD-10-CM | POA: Diagnosis not present

## 2023-02-03 DIAGNOSIS — E1165 Type 2 diabetes mellitus with hyperglycemia: Secondary | ICD-10-CM | POA: Diagnosis not present

## 2023-02-03 DIAGNOSIS — Z0001 Encounter for general adult medical examination with abnormal findings: Secondary | ICD-10-CM | POA: Diagnosis not present

## 2023-02-03 DIAGNOSIS — Z6833 Body mass index (BMI) 33.0-33.9, adult: Secondary | ICD-10-CM | POA: Diagnosis not present

## 2023-02-06 DIAGNOSIS — M810 Age-related osteoporosis without current pathological fracture: Secondary | ICD-10-CM | POA: Diagnosis not present

## 2023-02-06 DIAGNOSIS — Z79899 Other long term (current) drug therapy: Secondary | ICD-10-CM | POA: Diagnosis not present

## 2023-02-06 DIAGNOSIS — M8589 Other specified disorders of bone density and structure, multiple sites: Secondary | ICD-10-CM | POA: Diagnosis not present

## 2023-02-08 DIAGNOSIS — Z79899 Other long term (current) drug therapy: Secondary | ICD-10-CM | POA: Diagnosis not present

## 2023-02-17 DIAGNOSIS — K219 Gastro-esophageal reflux disease without esophagitis: Secondary | ICD-10-CM | POA: Diagnosis not present

## 2023-02-17 DIAGNOSIS — F332 Major depressive disorder, recurrent severe without psychotic features: Secondary | ICD-10-CM | POA: Diagnosis not present

## 2023-02-17 DIAGNOSIS — F419 Anxiety disorder, unspecified: Secondary | ICD-10-CM | POA: Diagnosis not present

## 2023-02-17 DIAGNOSIS — R030 Elevated blood-pressure reading, without diagnosis of hypertension: Secondary | ICD-10-CM | POA: Diagnosis not present

## 2023-02-17 DIAGNOSIS — E1165 Type 2 diabetes mellitus with hyperglycemia: Secondary | ICD-10-CM | POA: Diagnosis not present

## 2023-02-17 DIAGNOSIS — M545 Low back pain, unspecified: Secondary | ICD-10-CM | POA: Diagnosis not present

## 2023-02-17 DIAGNOSIS — Z6833 Body mass index (BMI) 33.0-33.9, adult: Secondary | ICD-10-CM | POA: Diagnosis not present

## 2023-02-20 DIAGNOSIS — Z6835 Body mass index (BMI) 35.0-35.9, adult: Secondary | ICD-10-CM | POA: Diagnosis not present

## 2023-02-20 DIAGNOSIS — M797 Fibromyalgia: Secondary | ICD-10-CM | POA: Diagnosis not present

## 2023-02-20 DIAGNOSIS — E119 Type 2 diabetes mellitus without complications: Secondary | ICD-10-CM | POA: Diagnosis not present

## 2023-02-20 DIAGNOSIS — Z79899 Other long term (current) drug therapy: Secondary | ICD-10-CM | POA: Diagnosis not present

## 2023-02-20 DIAGNOSIS — M5137 Other intervertebral disc degeneration, lumbosacral region: Secondary | ICD-10-CM | POA: Diagnosis not present

## 2023-02-23 ENCOUNTER — Telehealth: Payer: Self-pay

## 2023-02-23 NOTE — Telephone Encounter (Signed)
Patient called in today to confirmed appt. Patient is aware of appt and  to scheduled renal US . Patient voiced understanding

## 2023-02-24 ENCOUNTER — Ambulatory Visit (HOSPITAL_COMMUNITY)
Admission: RE | Admit: 2023-02-24 | Discharge: 2023-02-24 | Disposition: A | Discharge status: Home / Self Care | Payer: Medicare HMO | Source: Ambulatory Visit | Attending: Urology | Admitting: Urology

## 2023-02-24 ENCOUNTER — Encounter: Payer: Self-pay | Admitting: Urology

## 2023-02-24 ENCOUNTER — Ambulatory Visit (INDEPENDENT_AMBULATORY_CARE_PROVIDER_SITE_OTHER): Payer: Medicare HMO | Admitting: Urology

## 2023-02-24 VITALS — BP 126/74 | HR 102

## 2023-02-24 DIAGNOSIS — N200 Calculus of kidney: Secondary | ICD-10-CM | POA: Diagnosis not present

## 2023-02-24 DIAGNOSIS — Z87442 Personal history of urinary calculi: Secondary | ICD-10-CM

## 2023-02-24 DIAGNOSIS — Z09 Encounter for follow-up examination after completed treatment for conditions other than malignant neoplasm: Secondary | ICD-10-CM

## 2023-02-24 LAB — URINALYSIS, ROUTINE W REFLEX MICROSCOPIC
Bilirubin, UA: NEGATIVE
Glucose, UA: NEGATIVE
Ketones, UA: NEGATIVE
Nitrite, UA: NEGATIVE
Protein,UA: NEGATIVE
Specific Gravity, UA: 1.02 (ref 1.005–1.030)
Urobilinogen, Ur: 0.2 mg/dL (ref 0.2–1.0)
pH, UA: 7.5 (ref 5.0–7.5)

## 2023-02-24 LAB — MICROSCOPIC EXAMINATION: Bacteria, UA: NONE SEEN

## 2023-02-24 NOTE — Patient Instructions (Signed)

## 2023-02-24 NOTE — Progress Notes (Signed)
02/24/2023 1:31 PM   Cindy Norris Oct 02, 1955 [id number]  Referring provider: Practice, [facility] [address] [place],  [place] [address]  Followup nephrolithiasis   HPI: Cindy Norris is a 66yo here for followup for nephrolithiasis. She denies any stone events since last visit. She denies any flank pain. Renal US from today shows no calculi. No other complaints today   PMH: Past Medical History:  Diagnosis Date   Asthma    Chronic pain syndrome    Degenerative disc disease, lumbar    Depression    Diabetes mellitus    GERD (gastroesophageal reflux disease)    History of kidney stones    Hyperlipidemia, mixed    Migraines    Narcolepsy    Osteoarthritis    Overweight(278.02)    Obesity   Seizures (HCC)    unknown etiology-on meds-last seizure was several years ago   Unspecified essential hypertension     Surgical History: Past Surgical History:  Procedure Laterality Date   BREAST MASS EXCISION Right    CATARACT EXTRACTION W/PHACO Left 05/09/2022   Procedure: CATARACT EXTRACTION PHACO AND INTRAOCULAR LENS PLACEMENT (IOC);  Surgeon: Fabio Pierce, MD;  Location: [facility];  Service: Ophthalmology;  Laterality: Left;  CDE: 10.22   CATARACT EXTRACTION W/PHACO Right 05/23/2022   Procedure: CATARACT EXTRACTION PHACO AND INTRAOCULAR LENS PLACEMENT (IOC);  Surgeon: Fabio Pierce, MD;  Location: [facility];  Service: Ophthalmology;  Laterality: Right;  CDE 7.15   IR NEPHROURETERAL CATH PLACE RIGHT  12/21/2022   KNEE ARTHROSCOPY     NEPHROLITHOTOMY Right 12/22/2022   Procedure: NEPHROLITHOTOMY PERCUTANEOUS;  Surgeon: Malen Gauze, MD;  Location: [facility];  Service: Urology;  Laterality: Right;   PILONIDAL CYST EXCISION     SHOULDER SURGERY Left    TOTAL KNEE ARTHROPLASTY Bilateral     Home Medications:  Allergies as of 02/24/2023       Reactions   Dilaudid [hydromorphone Hcl] Palpitations, Rash   Azithromycin Nausea And Vomiting   Other    Dawn dish detergent-RASH    Tape Other (See Comments)   Blister   Erythromycin Nausea Only   Lyrica [pregabalin] Swelling, Rash   Penicillins Nausea Only   Reglan [metoclopramide] Palpitations   Sulfonamide Derivatives Rash        Medication List        Accurate as of Feb 24, 2023  1:31 PM. If you have any questions, ask your nurse or doctor.          albuterol 108 (90 Base) MCG/ACT inhaler Commonly known as: VENTOLIN HFA Inhale 1-2 puffs into the lungs as needed for wheezing or shortness of breath.   atorvastatin 80 MG tablet Commonly known as: LIPITOR Take 80 mg by mouth at bedtime.   busPIRone 10 MG tablet Commonly known as: BUSPAR Take 10 mg by mouth 2 (two) times daily.   cephALEXin 500 MG capsule Commonly known as: KEFLEX Take 500-1,000 mg by mouth See admin instructions. Take 1000 mg in the morning and 500 mg at night   Dulaglutide 1.5 MG/0.5ML Sopn Inject 1.5 mg into the skin every Wednesday.   ezetimibe 10 MG tablet Commonly known as: ZETIA Take 10 mg by mouth daily.   gabapentin 800 MG tablet Commonly known as: NEURONTIN Take 800 mg by mouth 3 (three) times daily.   HYDROcodone-acetaminophen 7.5-325 MG tablet Commonly known as: NORCO Take 1 tablet by mouth every 6 (six) hours as needed for moderate pain.   hydrOXYzine 25 MG tablet Commonly  known as: ATARAX Take 25 mg by mouth 2 (two) times daily.   ibuprofen 800 MG tablet Commonly known as: ADVIL Take 800 mg by mouth every 8 (eight) hours as needed for moderate pain.   levETIRAcetam 1000 MG tablet Commonly known as: KEPPRA Take 1,000 mg by mouth 2 (two) times daily.   lidocaine 5 % Commonly known as: LIDODERM Place 1 patch onto the skin daily as needed (pain). Remove & Discard patch within 12 hours or as directed by MD   metFORMIN 500 MG 24 hr tablet Commonly known as: GLUCOPHAGE-XR Take 500 mg by mouth 2 (two) times daily.   midodrine 10 MG tablet Commonly known as: PROAMATINE Take 15 mg by mouth 3 (three)  times daily.   modafinil 200 MG tablet Commonly known as: PROVIGIL Take 400 mg by mouth every morning.   multivitamin with minerals Tabs tablet Take 1 tablet by mouth daily.   nabumetone 750 MG tablet Commonly known as: RELAFEN Take 750 mg by mouth 2 (two) times daily.   omeprazole 40 MG capsule Commonly known as: PRILOSEC Take 80 mg by mouth at bedtime.   oxybutynin 5 MG tablet Commonly known as: DITROPAN Take 5 mg by mouth 3 (three) times daily.   oxyCODONE-acetaminophen 5-325 MG tablet Commonly known as: Percocet Take 1 tablet by mouth every 4 (four) hours as needed for severe pain.   SUMAtriptan 50 MG tablet Commonly known as: IMITREX Take 50-100 mg by mouth every 2 (two) hours as needed for migraine. May repeat in 2 hours if headache persists or recurs.   SYSTANE OP Place 1 drop into both eyes 2 (two) times daily.   topiramate 100 MG tablet Commonly known as: TOPAMAX Take 100 mg by mouth 2 (two) times daily.   venlafaxine XR 150 MG 24 hr capsule Commonly known as: EFFEXOR-XR Take 300 mg by mouth daily.        Allergies:  Allergies  Allergen Reactions   Dilaudid [Hydromorphone Hcl] Palpitations and Rash   Azithromycin Nausea And Vomiting   Other     Dawn dish detergent-RASH   Tape Other (See Comments)    Blister   Erythromycin Nausea Only   Lyrica [Pregabalin] Swelling and Rash   Penicillins Nausea Only   Reglan [Metoclopramide] Palpitations   Sulfonamide Derivatives Rash    Family History: Family History  Problem Relation Age of Onset   Coronary artery disease Other     Social History:  reports that she quit smoking about 34 years ago. Her smoking use included cigarettes. She has a 2.00 pack-year smoking history. She has never used smokeless tobacco. She reports that she does not drink alcohol and does not use drugs.  ROS: All other review of systems were reviewed and are negative except what is noted above in HPI  Physical Exam: BP 126/74    Pulse (!) 102   Constitutional:  Alert and oriented, No acute distress. HEENT: [place] AT, moist mucus membranes.  Trachea midline, no masses. Cardiovascular: No clubbing, cyanosis, or edema. Respiratory: Normal respiratory effort, no increased work of breathing. GI: Abdomen is soft, nontender, nondistended, no abdominal masses GU: No CVA tenderness.  Lymph: No cervical or inguinal lymphadenopathy. Skin: No rashes, bruises or suspicious lesions. Neurologic: Grossly intact, no focal deficits, moving all 4 extremities. Psychiatric: Normal mood and affect.  Laboratory Data: Lab Results  Component Value Date   WBC 11.9 (H) 12/23/2022   HGB 9.4 (L) 12/23/2022   HCT 30.0 (L) 12/23/2022   MCV 91.7  12/23/2022   PLT 250 12/23/2022    Lab Results  Component Value Date   CREATININE 0.76 12/23/2022    No results found for: "PSA"  No results found for: "TESTOSTERONE"  Lab Results  Component Value Date   HGBA1C 8.0 (H) 12/20/2022    Urinalysis    Component Value Date/Time   COLORURINE STRAW (A) 01/29/2020 1508   APPEARANCEUR Cloudy (A) 01/04/2023 1010   LABSPEC 1.011 01/29/2020 1508   PHURINE 6.0 01/29/2020 1508   GLUCOSEU Trace (A) 01/04/2023 1010   HGBUR MODERATE (A) 01/29/2020 1508   BILIRUBINUR Negative 01/04/2023 1010   KETONESUR NEGATIVE 01/29/2020 1508   PROTEINUR 3+ (A) 01/04/2023 1010   PROTEINUR NEGATIVE 01/29/2020 1508   UROBILINOGEN 2.0 (H) 06/19/2008 1206   NITRITE Negative 01/04/2023 1010   NITRITE NEGATIVE 01/29/2020 1508   LEUKOCYTESUR 1+ (A) 01/04/2023 1010   LEUKOCYTESUR NEGATIVE 01/29/2020 1508    Lab Results  Component Value Date   LABMICR See below: 01/04/2023   WBCUA 6-10 (A) 01/04/2023   LABEPIT 0-10 01/04/2023   BACTERIA None seen 01/04/2023    Pertinent Imaging: Renal US today: Images reviewed and discussed with the patient  Results for orders placed in visit on 11/08/22  DG Abd 1 View  Narrative CLINICAL DATA:  History of kidney stones.   Currently asymptomatic.  EXAM: ABDOMEN - 1 VIEW  COMPARISON:  CT 10/17/2022  FINDINGS: Bowel gas pattern is nonobstructive. There are several air-filled nondilated large and small bowel loops. No free peritoneal air. Evidence of patient's known right-sided nephrolithiasis as the largest stone measures 3 cm over the renal pelvis unchanged. No definitive stones seen over the left kidney. Surgical sutures over the lower abdomen/pelvis compatible with prior abdominal wall hernia repair. Remainder of the exam is unremarkable.  IMPRESSION: 1. Nonobstructive bowel gas pattern. 2. Right-sided nephrolithiasis unchanged.   Electronically Signed By: Elberta Fortis M.D. On: 11/10/2022 14:25  No results found for this or any previous visit.  No results found for this or any previous visit.  No results found for this or any previous visit.  Results for orders placed during the hospital encounter of 02/24/23  Ultrasound renal complete  Narrative CLINICAL DATA:  Nephrolithiasis follow-up.  EXAM: RENAL / URINARY TRACT ULTRASOUND COMPLETE  COMPARISON:  CT abdomen pelvis 10/17/2022  FINDINGS: Right Kidney:  Renal measurements: 13.1 x 6.2 x 6.2 cm = volume: 261.3 mL. Normal renal cortical thickness and echogenicity. There is an exophytic 1.5 x 1.3 x 1.4 cm hypoechoic mass midpole right kidney, potentially a complicated cyst. No hydronephrosis.  Left Kidney:  Renal measurements: 12.9 x 6.5 x 5.9 cm = volume: 256.1 mL. Echogenicity within normal limits. No mass or hydronephrosis visualized.  Bladder:  Appears normal for degree of bladder distention.  Other:  None.  IMPRESSION: 1. No hydronephrosis.  No definite nephrolithiasis. 2. Exophytic 1.5 cm hypoechoic mass midpole right kidney, potentially a complicated cyst. Recommend follow-up ultrasound in 6 months to assess for stability.   Electronically Signed By: Annia Belt M.D. On: 02/24/2023 12:08  No valid  procedures specified. No results found for this or any previous visit.  No results found for this or any previous visit.   Assessment & Plan:    1. Kidney stones Followup 6 months with renal US -dietary handout given  - Urinalysis, Routine w reflex microscopic   No follow-ups on file.  Cindy Aye, MD  [facility] [place]

## 2023-03-01 DIAGNOSIS — N6002 Solitary cyst of left breast: Secondary | ICD-10-CM | POA: Diagnosis not present

## 2023-03-01 DIAGNOSIS — R92333 Mammographic heterogeneous density, bilateral breasts: Secondary | ICD-10-CM | POA: Diagnosis not present

## 2023-03-23 DIAGNOSIS — R300 Dysuria: Secondary | ICD-10-CM | POA: Diagnosis not present

## 2023-03-23 DIAGNOSIS — E119 Type 2 diabetes mellitus without complications: Secondary | ICD-10-CM | POA: Diagnosis not present

## 2023-03-23 DIAGNOSIS — M797 Fibromyalgia: Secondary | ICD-10-CM | POA: Diagnosis not present

## 2023-03-23 DIAGNOSIS — E7800 Pure hypercholesterolemia, unspecified: Secondary | ICD-10-CM | POA: Diagnosis not present

## 2023-03-23 DIAGNOSIS — Z79899 Other long term (current) drug therapy: Secondary | ICD-10-CM | POA: Diagnosis not present

## 2023-03-23 DIAGNOSIS — M5137 Other intervertebral disc degeneration, lumbosacral region: Secondary | ICD-10-CM | POA: Diagnosis not present

## 2023-03-23 DIAGNOSIS — R5383 Other fatigue: Secondary | ICD-10-CM | POA: Diagnosis not present

## 2023-03-23 DIAGNOSIS — R030 Elevated blood-pressure reading, without diagnosis of hypertension: Secondary | ICD-10-CM | POA: Diagnosis not present

## 2023-03-23 DIAGNOSIS — Z6834 Body mass index (BMI) 34.0-34.9, adult: Secondary | ICD-10-CM | POA: Diagnosis not present

## 2023-03-23 DIAGNOSIS — E559 Vitamin D deficiency, unspecified: Secondary | ICD-10-CM | POA: Diagnosis not present

## 2023-03-23 DIAGNOSIS — N390 Urinary tract infection, site not specified: Secondary | ICD-10-CM | POA: Diagnosis not present

## 2023-03-26 DIAGNOSIS — K219 Gastro-esophageal reflux disease without esophagitis: Secondary | ICD-10-CM | POA: Diagnosis not present

## 2023-03-26 DIAGNOSIS — E119 Type 2 diabetes mellitus without complications: Secondary | ICD-10-CM | POA: Diagnosis not present

## 2023-03-26 DIAGNOSIS — E782 Mixed hyperlipidemia: Secondary | ICD-10-CM | POA: Diagnosis not present

## 2023-03-26 DIAGNOSIS — G8929 Other chronic pain: Secondary | ICD-10-CM | POA: Diagnosis not present

## 2023-03-27 DIAGNOSIS — Z79899 Other long term (current) drug therapy: Secondary | ICD-10-CM | POA: Diagnosis not present

## 2023-04-03 ENCOUNTER — Ambulatory Visit: Payer: Medicare HMO | Admitting: Cardiology

## 2023-04-20 DIAGNOSIS — E6609 Other obesity due to excess calories: Secondary | ICD-10-CM | POA: Diagnosis not present

## 2023-04-20 DIAGNOSIS — Z79899 Other long term (current) drug therapy: Secondary | ICD-10-CM | POA: Diagnosis not present

## 2023-04-20 DIAGNOSIS — R030 Elevated blood-pressure reading, without diagnosis of hypertension: Secondary | ICD-10-CM | POA: Diagnosis not present

## 2023-04-20 DIAGNOSIS — Z6832 Body mass index (BMI) 32.0-32.9, adult: Secondary | ICD-10-CM | POA: Diagnosis not present

## 2023-04-20 DIAGNOSIS — M797 Fibromyalgia: Secondary | ICD-10-CM | POA: Diagnosis not present

## 2023-04-20 DIAGNOSIS — E119 Type 2 diabetes mellitus without complications: Secondary | ICD-10-CM | POA: Diagnosis not present

## 2023-04-20 DIAGNOSIS — M5137 Other intervertebral disc degeneration, lumbosacral region: Secondary | ICD-10-CM | POA: Diagnosis not present

## 2023-05-18 DIAGNOSIS — E119 Type 2 diabetes mellitus without complications: Secondary | ICD-10-CM | POA: Diagnosis not present

## 2023-05-18 DIAGNOSIS — M5137 Other intervertebral disc degeneration, lumbosacral region: Secondary | ICD-10-CM | POA: Diagnosis not present

## 2023-05-18 DIAGNOSIS — Z79899 Other long term (current) drug therapy: Secondary | ICD-10-CM | POA: Diagnosis not present

## 2023-05-18 DIAGNOSIS — E6609 Other obesity due to excess calories: Secondary | ICD-10-CM | POA: Diagnosis not present

## 2023-05-18 DIAGNOSIS — M797 Fibromyalgia: Secondary | ICD-10-CM | POA: Diagnosis not present

## 2023-05-22 DIAGNOSIS — Z79899 Other long term (current) drug therapy: Secondary | ICD-10-CM | POA: Diagnosis not present

## 2023-05-26 DIAGNOSIS — E1165 Type 2 diabetes mellitus with hyperglycemia: Secondary | ICD-10-CM | POA: Diagnosis not present

## 2023-05-26 DIAGNOSIS — E782 Mixed hyperlipidemia: Secondary | ICD-10-CM | POA: Diagnosis not present

## 2023-06-19 ENCOUNTER — Encounter: Payer: Self-pay | Admitting: Internal Medicine

## 2023-07-31 ENCOUNTER — Encounter: Payer: Self-pay | Admitting: Neurology

## 2023-08-21 ENCOUNTER — Ambulatory Visit: Payer: Medicare HMO | Admitting: Urology

## 2023-10-17 DIAGNOSIS — E119 Type 2 diabetes mellitus without complications: Secondary | ICD-10-CM | POA: Diagnosis not present

## 2023-10-17 DIAGNOSIS — M47816 Spondylosis without myelopathy or radiculopathy, lumbar region: Secondary | ICD-10-CM | POA: Diagnosis not present

## 2023-10-18 DIAGNOSIS — M6281 Muscle weakness (generalized): Secondary | ICD-10-CM | POA: Diagnosis not present

## 2023-10-18 DIAGNOSIS — R269 Unspecified abnormalities of gait and mobility: Secondary | ICD-10-CM | POA: Diagnosis not present

## 2023-10-18 DIAGNOSIS — M545 Low back pain, unspecified: Secondary | ICD-10-CM | POA: Diagnosis not present

## 2023-10-18 DIAGNOSIS — M47816 Spondylosis without myelopathy or radiculopathy, lumbar region: Secondary | ICD-10-CM | POA: Diagnosis not present

## 2023-10-23 DIAGNOSIS — E119 Type 2 diabetes mellitus without complications: Secondary | ICD-10-CM | POA: Diagnosis not present

## 2023-10-23 DIAGNOSIS — Z79899 Other long term (current) drug therapy: Secondary | ICD-10-CM | POA: Diagnosis not present

## 2023-10-23 DIAGNOSIS — G629 Polyneuropathy, unspecified: Secondary | ICD-10-CM | POA: Diagnosis not present

## 2023-10-23 DIAGNOSIS — M797 Fibromyalgia: Secondary | ICD-10-CM | POA: Diagnosis not present

## 2023-10-23 DIAGNOSIS — Z794 Long term (current) use of insulin: Secondary | ICD-10-CM | POA: Diagnosis not present

## 2023-10-23 DIAGNOSIS — M51379 Other intervertebral disc degeneration, lumbosacral region without mention of lumbar back pain or lower extremity pain: Secondary | ICD-10-CM | POA: Diagnosis not present

## 2023-10-23 DIAGNOSIS — E1165 Type 2 diabetes mellitus with hyperglycemia: Secondary | ICD-10-CM | POA: Diagnosis not present

## 2023-10-24 DIAGNOSIS — M6281 Muscle weakness (generalized): Secondary | ICD-10-CM | POA: Diagnosis not present

## 2023-10-24 DIAGNOSIS — M545 Low back pain, unspecified: Secondary | ICD-10-CM | POA: Diagnosis not present

## 2023-10-24 DIAGNOSIS — R269 Unspecified abnormalities of gait and mobility: Secondary | ICD-10-CM | POA: Diagnosis not present

## 2023-10-24 DIAGNOSIS — M47816 Spondylosis without myelopathy or radiculopathy, lumbar region: Secondary | ICD-10-CM | POA: Diagnosis not present

## 2023-10-26 DIAGNOSIS — M25512 Pain in left shoulder: Secondary | ICD-10-CM | POA: Diagnosis not present

## 2023-10-26 DIAGNOSIS — M47816 Spondylosis without myelopathy or radiculopathy, lumbar region: Secondary | ICD-10-CM | POA: Diagnosis not present

## 2023-10-26 DIAGNOSIS — M542 Cervicalgia: Secondary | ICD-10-CM | POA: Diagnosis not present

## 2023-10-26 DIAGNOSIS — M25511 Pain in right shoulder: Secondary | ICD-10-CM | POA: Diagnosis not present

## 2023-10-26 DIAGNOSIS — M545 Low back pain, unspecified: Secondary | ICD-10-CM | POA: Diagnosis not present

## 2023-10-26 DIAGNOSIS — M6281 Muscle weakness (generalized): Secondary | ICD-10-CM | POA: Diagnosis not present

## 2023-10-27 DIAGNOSIS — M25512 Pain in left shoulder: Secondary | ICD-10-CM | POA: Diagnosis not present

## 2023-10-27 DIAGNOSIS — M542 Cervicalgia: Secondary | ICD-10-CM | POA: Diagnosis not present

## 2023-10-31 DIAGNOSIS — M545 Low back pain, unspecified: Secondary | ICD-10-CM | POA: Diagnosis not present

## 2023-10-31 DIAGNOSIS — M542 Cervicalgia: Secondary | ICD-10-CM | POA: Diagnosis not present

## 2023-10-31 DIAGNOSIS — M25512 Pain in left shoulder: Secondary | ICD-10-CM | POA: Diagnosis not present

## 2023-10-31 DIAGNOSIS — M6281 Muscle weakness (generalized): Secondary | ICD-10-CM | POA: Diagnosis not present

## 2023-10-31 DIAGNOSIS — M47816 Spondylosis without myelopathy or radiculopathy, lumbar region: Secondary | ICD-10-CM | POA: Diagnosis not present

## 2023-10-31 DIAGNOSIS — M25511 Pain in right shoulder: Secondary | ICD-10-CM | POA: Diagnosis not present

## 2023-11-02 DIAGNOSIS — M47816 Spondylosis without myelopathy or radiculopathy, lumbar region: Secondary | ICD-10-CM | POA: Diagnosis not present

## 2023-11-02 DIAGNOSIS — M25511 Pain in right shoulder: Secondary | ICD-10-CM | POA: Diagnosis not present

## 2023-11-02 DIAGNOSIS — M545 Low back pain, unspecified: Secondary | ICD-10-CM | POA: Diagnosis not present

## 2023-11-02 DIAGNOSIS — M542 Cervicalgia: Secondary | ICD-10-CM | POA: Diagnosis not present

## 2023-11-02 DIAGNOSIS — M6281 Muscle weakness (generalized): Secondary | ICD-10-CM | POA: Diagnosis not present

## 2023-11-02 DIAGNOSIS — M25512 Pain in left shoulder: Secondary | ICD-10-CM | POA: Diagnosis not present

## 2023-11-07 DIAGNOSIS — M25512 Pain in left shoulder: Secondary | ICD-10-CM | POA: Diagnosis not present

## 2023-11-07 DIAGNOSIS — M47816 Spondylosis without myelopathy or radiculopathy, lumbar region: Secondary | ICD-10-CM | POA: Diagnosis not present

## 2023-11-07 DIAGNOSIS — M6281 Muscle weakness (generalized): Secondary | ICD-10-CM | POA: Diagnosis not present

## 2023-11-07 DIAGNOSIS — M25511 Pain in right shoulder: Secondary | ICD-10-CM | POA: Diagnosis not present

## 2023-11-07 DIAGNOSIS — M542 Cervicalgia: Secondary | ICD-10-CM | POA: Diagnosis not present

## 2023-11-07 DIAGNOSIS — M545 Low back pain, unspecified: Secondary | ICD-10-CM | POA: Diagnosis not present

## 2023-11-10 DIAGNOSIS — R1319 Other dysphagia: Secondary | ICD-10-CM | POA: Diagnosis not present

## 2023-11-10 DIAGNOSIS — E1165 Type 2 diabetes mellitus with hyperglycemia: Secondary | ICD-10-CM | POA: Diagnosis not present

## 2023-11-10 DIAGNOSIS — R0789 Other chest pain: Secondary | ICD-10-CM | POA: Diagnosis not present

## 2023-11-10 DIAGNOSIS — K219 Gastro-esophageal reflux disease without esophagitis: Secondary | ICD-10-CM | POA: Diagnosis not present

## 2023-11-20 DIAGNOSIS — M47812 Spondylosis without myelopathy or radiculopathy, cervical region: Secondary | ICD-10-CM | POA: Diagnosis not present

## 2023-11-20 DIAGNOSIS — Z79899 Other long term (current) drug therapy: Secondary | ICD-10-CM | POA: Diagnosis not present

## 2023-11-20 DIAGNOSIS — G629 Polyneuropathy, unspecified: Secondary | ICD-10-CM | POA: Diagnosis not present

## 2023-11-20 DIAGNOSIS — E119 Type 2 diabetes mellitus without complications: Secondary | ICD-10-CM | POA: Diagnosis not present

## 2023-11-21 DIAGNOSIS — M542 Cervicalgia: Secondary | ICD-10-CM | POA: Diagnosis not present

## 2023-11-21 DIAGNOSIS — M25512 Pain in left shoulder: Secondary | ICD-10-CM | POA: Diagnosis not present

## 2023-11-21 DIAGNOSIS — M6281 Muscle weakness (generalized): Secondary | ICD-10-CM | POA: Diagnosis not present

## 2023-11-21 DIAGNOSIS — M47816 Spondylosis without myelopathy or radiculopathy, lumbar region: Secondary | ICD-10-CM | POA: Diagnosis not present

## 2023-11-21 DIAGNOSIS — M545 Low back pain, unspecified: Secondary | ICD-10-CM | POA: Diagnosis not present

## 2023-11-21 DIAGNOSIS — M25511 Pain in right shoulder: Secondary | ICD-10-CM | POA: Diagnosis not present

## 2023-11-23 DIAGNOSIS — M545 Low back pain, unspecified: Secondary | ICD-10-CM | POA: Diagnosis not present

## 2023-11-23 DIAGNOSIS — M542 Cervicalgia: Secondary | ICD-10-CM | POA: Diagnosis not present

## 2023-11-23 DIAGNOSIS — M25512 Pain in left shoulder: Secondary | ICD-10-CM | POA: Diagnosis not present

## 2023-11-23 DIAGNOSIS — M6281 Muscle weakness (generalized): Secondary | ICD-10-CM | POA: Diagnosis not present

## 2023-11-23 DIAGNOSIS — M47816 Spondylosis without myelopathy or radiculopathy, lumbar region: Secondary | ICD-10-CM | POA: Diagnosis not present

## 2023-11-23 DIAGNOSIS — M25511 Pain in right shoulder: Secondary | ICD-10-CM | POA: Diagnosis not present

## 2023-11-29 DIAGNOSIS — M25512 Pain in left shoulder: Secondary | ICD-10-CM | POA: Diagnosis not present

## 2023-11-29 DIAGNOSIS — M47816 Spondylosis without myelopathy or radiculopathy, lumbar region: Secondary | ICD-10-CM | POA: Diagnosis not present

## 2023-11-29 DIAGNOSIS — M6281 Muscle weakness (generalized): Secondary | ICD-10-CM | POA: Diagnosis not present

## 2023-11-29 DIAGNOSIS — M25511 Pain in right shoulder: Secondary | ICD-10-CM | POA: Diagnosis not present

## 2023-11-29 DIAGNOSIS — M542 Cervicalgia: Secondary | ICD-10-CM | POA: Diagnosis not present

## 2023-11-29 DIAGNOSIS — M545 Low back pain, unspecified: Secondary | ICD-10-CM | POA: Diagnosis not present

## 2023-12-01 ENCOUNTER — Encounter: Payer: Self-pay | Admitting: *Deleted

## 2023-12-01 ENCOUNTER — Telehealth: Payer: Self-pay | Admitting: Internal Medicine

## 2023-12-01 DIAGNOSIS — R072 Precordial pain: Secondary | ICD-10-CM

## 2023-12-01 NOTE — Telephone Encounter (Signed)
 Spoke with patient and informed of Dr. Antoine Poche response. Pt agrees with plan of care and will proceed to ER if chest pain worsens.

## 2023-12-01 NOTE — Telephone Encounter (Signed)
  Pt c/o of Chest Pain: STAT if active CP, including tightness, pressure, jaw pain, radiating pain to shoulder/upper arm/back, CP unrelieved by Nitro. Symptoms reported of SOB, nausea, vomiting, sweating.  1. Are you having CP right now?   A little bit of "pressure on her chest"  2. Are you experiencing any other symptoms (ex. SOB, nausea, vomiting, sweating)?   SOB, nausea every once in a while  3. Is your CP continuous or coming and going?   Comes and goes   4. Have you taken Nitroglycerin?   No  5. How long have you been experiencing CP?   Started 2-3 weeks ago   6. If NO CP at time of call then end call with telling Pt to call back or call 911 if Chest pain returns prior to return call from triage team.   Patient stated she is concerned about having "pressure on her chest". Patient has visit scheduled on 3/18.

## 2023-12-01 NOTE — Telephone Encounter (Signed)
 Spoke with pt who states that she has chest pressure that is worse when moves around. Pt c/o SOB and nausea at times. She rates the pain/pressure 3/10. Encouraged pt to be seen in ER for evaluation. Pt states that " I think I'll be ok until Monday". Informed pt that we would notify Dr. Jenene Slicker and await her response. Please advise.

## 2023-12-05 DIAGNOSIS — M47816 Spondylosis without myelopathy or radiculopathy, lumbar region: Secondary | ICD-10-CM | POA: Diagnosis not present

## 2023-12-05 NOTE — Progress Notes (Signed)
 Cardiology Office Note:  .   Date:  12/12/2023  ID:  Cindy Norris, DOB 03/10/1956, MRN [id number] PCP: Lianne Moris, PA-C  [facility] Providers Cardiologist:  Armanda Magic, MD    History of Present Illness: .   Cindy Norris is a 68 y.o. female with history ofHLD, OSA not on CPAPchest pain/palpitations. NM stress test in 2022 showed no ischemia, 2D echo showed normal LV function with EF 55 to 60%, 14-day Zio monitor showed rare PACs/PVCs with 3 episodes of SVT lasting about 12 seconds on average with a heart rate of 140 bpm  Patient called in with chest pain on 12/01/23 and Dr. Jenene Slicker ordered a stress test. She says her family wouldn't bring her. Her grandson is her CNA but he's not doing what he's supposed to.  Still having chest pressure off and on all the time and also with exertion and DOE. She's on midodrine for orthostatic hypotension and often forgets the second dose.   ROS:    Studies Reviewed: Marland Kitchen         Prior CV Studies:    Echo 03/2021 IMPRESSIONS     1. Left ventricular ejection fraction, by estimation, is 55 to 60%. The  left ventricle has normal function. The left ventricle has no regional  wall motion abnormalities. Left ventricular diastolic parameters are  consistent with Grade I diastolic  dysfunction (impaired relaxation). Elevated left atrial pressure.   2. Right ventricular systolic function is normal. The right ventricular  size is normal. Tricuspid regurgitation signal is inadequate for assessing  PA pressure.   3. The mitral valve is normal in structure. No evidence of mitral valve  regurgitation. No evidence of mitral stenosis.   4. The aortic valve is tricuspid. Aortic valve regurgitation is not  visualized. No aortic stenosis is present.   5. The inferior vena cava is normal in size with greater than 50%  respiratory variability, suggesting right atrial pressure of 3 mmHg.    Risk Assessment/Calculations:             Physical Exam:    VS:  BP 120/64 (BP Location: Right Arm, Cuff Size: Normal)   Pulse 82   Ht 5\' 5"  (1.651 m)   Wt 84 kg   SpO2 96%   BMI 30.82 kg/m    Wt Readings from Last 3 Encounters:  12/12/23 84 kg  12/22/22 89.4 kg  12/21/22 89.4 kg    GEN: Well nourished, well developed in no acute distress NECK: No JVD; No carotid bruits CARDIAC:  RRR, no murmurs, rubs, gallops RESPIRATORY:  Clear to auscultation without rales, wheezing or rhonchi  ABDOMEN: Soft, non-tender, non-distended EXTREMITIES:  No edema; No deformity   ASSESSMENT AND PLAN: .    Chest pain-NST ordered but hasn't been done due to transportation issues. Will try low dose coreg for palpitations and chest pain and try to reschedule stress test. ER precautions discussed. Treatment limited with history of orthostatic hypotension on midodrine.  Hisotry of palpitations and SVT-occasional palpitations  HLD-on lipitor   OSA not on cPAP after losing 90 lbs but has gained 30 lbs back.      Informed Consent   Shared Decision Making/Informed Consent The risks [chest pain, shortness of breath, cardiac arrhythmias, dizziness, blood pressure fluctuations, myocardial infarction, stroke/transient ischemic attack, nausea, vomiting, allergic reaction, radiation exposure, metallic taste sensation and life-threatening complications (estimated to be 1 in 10,000)], benefits (risk stratification, diagnosing coronary artery disease, treatment guidance) and alternatives of a nuclear  stress test were discussed in detail with Cindy Norris and she agrees to proceed.     Dispo: f/u after stress test.  Signed, Jacolyn Reedy, PA-C

## 2023-12-06 ENCOUNTER — Encounter: Payer: Self-pay | Admitting: *Deleted

## 2023-12-06 ENCOUNTER — Telehealth (HOSPITAL_COMMUNITY): Payer: Self-pay | Admitting: Surgery

## 2023-12-06 NOTE — Telephone Encounter (Signed)
 I called to remind the pt of her stress test tomorrow, with her arrival time being 8:30.  I left a VM with the following instructions:  nothing to eat or drink 6 hrs prior to the test, no smoking 8 hrs prior to the test, wear tennis shoes and comfortable clothes, no lotions/oils, follow any instructions from her cardiologist about holding medications, and to hold her Metformin the morning of the test. I told the pt to call me back if she had any questions.

## 2023-12-07 ENCOUNTER — Ambulatory Visit (HOSPITAL_COMMUNITY)

## 2023-12-07 ENCOUNTER — Encounter (HOSPITAL_COMMUNITY)

## 2023-12-07 DIAGNOSIS — M25511 Pain in right shoulder: Secondary | ICD-10-CM | POA: Diagnosis not present

## 2023-12-07 DIAGNOSIS — M47816 Spondylosis without myelopathy or radiculopathy, lumbar region: Secondary | ICD-10-CM | POA: Diagnosis not present

## 2023-12-07 DIAGNOSIS — M25512 Pain in left shoulder: Secondary | ICD-10-CM | POA: Diagnosis not present

## 2023-12-07 DIAGNOSIS — M6281 Muscle weakness (generalized): Secondary | ICD-10-CM | POA: Diagnosis not present

## 2023-12-07 DIAGNOSIS — M542 Cervicalgia: Secondary | ICD-10-CM | POA: Diagnosis not present

## 2023-12-07 DIAGNOSIS — M545 Low back pain, unspecified: Secondary | ICD-10-CM | POA: Diagnosis not present

## 2023-12-11 DIAGNOSIS — M545 Low back pain, unspecified: Secondary | ICD-10-CM | POA: Diagnosis not present

## 2023-12-11 DIAGNOSIS — M25511 Pain in right shoulder: Secondary | ICD-10-CM | POA: Diagnosis not present

## 2023-12-11 DIAGNOSIS — M47816 Spondylosis without myelopathy or radiculopathy, lumbar region: Secondary | ICD-10-CM | POA: Diagnosis not present

## 2023-12-11 DIAGNOSIS — M25512 Pain in left shoulder: Secondary | ICD-10-CM | POA: Diagnosis not present

## 2023-12-11 DIAGNOSIS — M6281 Muscle weakness (generalized): Secondary | ICD-10-CM | POA: Diagnosis not present

## 2023-12-11 DIAGNOSIS — M542 Cervicalgia: Secondary | ICD-10-CM | POA: Diagnosis not present

## 2023-12-12 ENCOUNTER — Encounter: Payer: Self-pay | Admitting: Physician Assistant

## 2023-12-12 ENCOUNTER — Telehealth: Payer: Self-pay | Admitting: Licensed Clinical Social Worker

## 2023-12-12 ENCOUNTER — Ambulatory Visit: Attending: Physician Assistant | Admitting: Physician Assistant

## 2023-12-12 VITALS — BP 120/64 | HR 82 | Ht 65.0 in | Wt 185.2 lb

## 2023-12-12 DIAGNOSIS — E785 Hyperlipidemia, unspecified: Secondary | ICD-10-CM | POA: Diagnosis not present

## 2023-12-12 DIAGNOSIS — G4733 Obstructive sleep apnea (adult) (pediatric): Secondary | ICD-10-CM

## 2023-12-12 DIAGNOSIS — R002 Palpitations: Secondary | ICD-10-CM

## 2023-12-12 DIAGNOSIS — R079 Chest pain, unspecified: Secondary | ICD-10-CM

## 2023-12-12 MED ORDER — CARVEDILOL 3.125 MG PO TABS: 3.1250 mg | ORAL_TABLET | Freq: Two times a day (BID) | ORAL | 3 refills | Status: DC

## 2023-12-12 NOTE — Progress Notes (Deleted)
 NEUROLOGY CONSULTATION NOTE  Cindy Norris MRN: [id number] DOB: Jul 10, 1956  Referring provider: Lianne Moris, PA-C Primary care provider: Quintin Alto, MD  Reason for consult:  migraines, history of seizures  Assessment/Plan:   ***   Subjective:  Cindy Norris is a 68 year old ***-handed female with HTN, HLD, DM 2, asthma, narcolepsy, chronic pain syndrome, sleep apnea, osteoarthritis, and GERD and history of kidney stones who presents for migraines and history of seizures.  History supplemented by referring provider's notes.  MRIs and CTs of head personally reviewed.  Migraines: Onset:  *** Location:  *** Quality:  *** Intensity:  ***.  *** denies new headache, thunderclap headache or severe headache that wakes *** from sleep. Aura:  *** Prodrome:  *** Postdrome:  *** Associated symptoms:  ***.  *** denies associated unilateral numbness or weakness. Duration:  *** Frequency:  *** Frequency of abortive medication: *** Triggers:  *** Relieving factors:  *** Activity:  ***  Seizure disorder: ***  Imaging: 10/03/2014 MRI BRAIN WO:  1. No acute intracranial abnormality or mass. 2. Patchy cerebral white matter T2 hyperintensities, mildly progressed from the prior MRI and nonspecific although suggestive of mild chronic small vessel ischemic disease. 04/07/2016 MRI BRAIN W WO:  No acute intracranial abnormality. Stable since 2016 and largely unremarkable for age MRI appearance of the brain.  04/04/2018 CT HEAD:  Negative non-contrast CT HEAD for age.  01/29/2020 CT HEAD:  No evidence for acute intracranial abnormality.   01/29/2020 CT C-SPINE:  No evidence for acute cervical spine abnormality.  Degenerative changes in the mid cervical spine.  Past NSAIDS/analgesics:  *** Past abortive triptans:  *** Past abortive ergotamine:  *** Past muscle relaxants:  *** Past anti-emetic:  *** Past antihypertensive medications:  *** Past antidepressant medications:  *** Past  anticonvulsant medications:  *** Past anti-CGRP:  *** Past vitamins/Herbal/Supplements:  *** Past antihistamines/decongestants:  meclizine Other past therapies:  ***  Current NSAIDS/analgesics:  ibuprofen 800mg , ASA 81mg  daily, hydrocodone-acetaminophen (chronic pain) Current triptans:  sumatriptan 50mg  Current ergotamine:  *** Current anti-emetic:  ondansetron 4mg  Current muscle relaxants:  cyclobenzaprine 10mg  BID  Current Antihypertensive medications:  *** Current Antidepressant medications:  venlafaxine ER 300mg  daily Current Anticonvulsant medications:  topiramate 100mg  BID, levetiracetam 1000mg  BID, gabapentin 800mg  BID Current anti-CGRP:  *** Current Vitamins/Herbal/Supplements:  *** Current Antihistamines/Decongestants:  *** Other therapy:  *** Birth control:  *** Other medications:  nabumetone, alprazolam, RItalin, midodrine, buspirone, Ambien, modafinil, oxybutynin   Caffeine:  *** Alcohol:  *** Smoker:  *** Diet:  *** Exercise:  *** Depression:  ***; Anxiety:  *** Other pain:  *** Sleep hygiene:  *** Family history of headache:  ***  06/07/2023 LABS:  CMP with Na 147, K 4, Cl 111, Ca 10.1, glucose 92, BUN 12, Cr 0.79, t bili 0.4, ALP 81, AST 28,  ALT 37; TSH 4.618    PAST MEDICAL HISTORY: Past Medical History:  Diagnosis Date   Asthma    Chronic pain syndrome    Degenerative disc disease, lumbar    Depression    Diabetes mellitus    GERD (gastroesophageal reflux disease)    History of kidney stones    Hyperlipidemia, mixed    Migraines    Narcolepsy    Osteoarthritis    Overweight(278.02)    Obesity   Seizures (HCC)    unknown etiology-on meds-last seizure was several years ago   Unspecified essential hypertension     PAST SURGICAL HISTORY: Past Surgical History:  Procedure Laterality  Date   BREAST MASS EXCISION Right    CATARACT EXTRACTION W/PHACO Left 05/09/2022   Procedure: CATARACT EXTRACTION PHACO AND INTRAOCULAR LENS PLACEMENT (IOC);   Surgeon: Fabio Pierce, MD;  Location: [facility];  Service: Ophthalmology;  Laterality: Left;  CDE: 10.22   CATARACT EXTRACTION W/PHACO Right 05/23/2022   Procedure: CATARACT EXTRACTION PHACO AND INTRAOCULAR LENS PLACEMENT (IOC);  Surgeon: Fabio Pierce, MD;  Location: [facility];  Service: Ophthalmology;  Laterality: Right;  CDE 7.15   IR NEPHROURETERAL CATH PLACE RIGHT  12/21/2022   KNEE ARTHROSCOPY     NEPHROLITHOTOMY Right 12/22/2022   Procedure: NEPHROLITHOTOMY PERCUTANEOUS;  Surgeon: Malen Gauze, MD;  Location: [facility];  Service: Urology;  Laterality: Right;   PILONIDAL CYST EXCISION     SHOULDER SURGERY Left    TOTAL KNEE ARTHROPLASTY Bilateral     MEDICATIONS: Current Outpatient Medications on File Prior to Visit  Medication Sig Dispense Refill   albuterol (VENTOLIN HFA) 108 (90 Base) MCG/ACT inhaler Inhale 1-2 puffs into the lungs as needed for wheezing or shortness of breath.     atorvastatin (LIPITOR) 80 MG tablet Take 80 mg by mouth at bedtime.     busPIRone (BUSPAR) 10 MG tablet Take 10 mg by mouth 2 (two) times daily.     cephALEXin (KEFLEX) 500 MG capsule Take 500-1,000 mg by mouth See admin instructions. Take 1000 mg in the morning and 500 mg at night     Dulaglutide 1.5 MG/0.5ML SOPN Inject 1.5 mg into the skin every Wednesday.     ezetimibe (ZETIA) 10 MG tablet Take 10 mg by mouth daily.     gabapentin (NEURONTIN) 800 MG tablet Take 800 mg by mouth 3 (three) times daily.     HYDROcodone-acetaminophen (NORCO) 7.5-325 MG tablet Take 1 tablet by mouth every 6 (six) hours as needed for moderate pain.     hydrOXYzine (ATARAX/VISTARIL) 25 MG tablet Take 25 mg by mouth 2 (two) times daily.     ibuprofen (ADVIL,MOTRIN) 800 MG tablet Take 800 mg by mouth every 8 (eight) hours as needed for moderate pain.     levETIRAcetam (KEPPRA) 1000 MG tablet Take 1,000 mg by mouth 2 (two) times daily.     lidocaine (LIDODERM) 5 % Place 1 patch onto the skin daily as needed (pain). Remove &  Discard patch within 12 hours or as directed by MD     metFORMIN (GLUCOPHAGE-XR) 500 MG 24 hr tablet Take 500 mg by mouth 2 (two) times daily.     midodrine (PROAMATINE) 10 MG tablet Take 15 mg by mouth 3 (three) times daily.     modafinil (PROVIGIL) 200 MG tablet Take 400 mg by mouth every morning.      Multiple Vitamin (MULTIVITAMIN WITH MINERALS) TABS tablet Take 1 tablet by mouth daily.     nabumetone (RELAFEN) 750 MG tablet Take 750 mg by mouth 2 (two) times daily.     omeprazole (PRILOSEC) 40 MG capsule Take 80 mg by mouth at bedtime.     oxybutynin (DITROPAN) 5 MG tablet Take 5 mg by mouth 3 (three) times daily.     oxyCODONE-acetaminophen (PERCOCET) 5-325 MG tablet Take 1 tablet by mouth every 4 (four) hours as needed for severe pain. 30 tablet 0   Polyethyl Glycol-Propyl Glycol (SYSTANE OP) Place 1 drop into both eyes 2 (two) times daily.     SUMAtriptan (IMITREX) 50 MG tablet Take 50-100 mg by mouth every 2 (two) hours as needed for migraine. May repeat in 2  hours if headache persists or recurs.     topiramate (TOPAMAX) 100 MG tablet Take 100 mg by mouth 2 (two) times daily.     venlafaxine XR (EFFEXOR-XR) 150 MG 24 hr capsule Take 300 mg by mouth daily.     No current facility-administered medications on file prior to visit.    ALLERGIES: Allergies  Allergen Reactions   Dilaudid [Hydromorphone Hcl] Palpitations and Rash   Azithromycin Nausea And Vomiting   Other     Dawn dish detergent-RASH   Tape Other (See Comments)    Blister   Erythromycin Nausea Only   Lyrica [Pregabalin] Swelling and Rash   Penicillins Nausea Only   Reglan [Metoclopramide] Palpitations   Sulfonamide Derivatives Rash    FAMILY HISTORY: Family History  Problem Relation Age of Onset   Coronary artery disease Other     Objective:  *** General: No acute distress.  Patient appears well-groomed.   Head:  Normocephalic/atraumatic Eyes:  fundi examined but not visualized Neck: supple, no paraspinal  tenderness, full range of motion Back: No paraspinal tenderness Heart: regular rate and rhythm Lungs: Clear to auscultation bilaterally. Vascular: No carotid bruits. Neurological Exam: Mental status: alert and oriented to person, place, and time, speech fluent and not dysarthric, language intact. Cranial nerves: CN I: not tested CN II: pupils equal, round and reactive to light, visual fields intact CN III, IV, VI:  full range of motion, no nystagmus, no ptosis CN V: facial sensation intact. CN VII: upper and lower face symmetric CN VIII: hearing intact CN IX, X: gag intact, uvula midline CN XI: sternocleidomastoid and trapezius muscles intact CN XII: tongue midline Bulk & Tone: normal, no fasciculations. Motor:  muscle strength 5/5 throughout Sensation:  Pinprick, temperature and vibratory sensation intact. Deep Tendon Reflexes:  2+ throughout,  toes downgoing.   Finger to nose testing:  Without dysmetria.   Heel to shin:  Without dysmetria.   Gait:  Normal station and stride.  Romberg negative.    Thank you for allowing me to take part in the care of this patient.  Shon Millet, DO  CC: ***

## 2023-12-12 NOTE — Patient Instructions (Signed)
 Medication Instructions:  Your physician recommends that you continue on your current medications as directed. Please refer to the Current Medication list given to you today.  Start Coreg 3.125 Two Times Daily   *If you need a refill on your cardiac medications before your next appointment, please call your pharmacy*   Lab Work: NONE   If you have labs (blood work) drawn today and your tests are completely normal, you will receive your results only by: MyChart Message (if you have MyChart) OR A paper copy in the mail If you have any lab test that is abnormal or we need to change your treatment, we will call you to review the results.   Testing/Procedures: NONE    Follow-Up: At [facility], you and your health needs are our priority.  As part of our continuing mission to provide you with exceptional heart care, we have created designated Provider Care Teams.  These Care Teams include your primary Cardiologist (physician) and Advanced Practice Providers (APPs -  Physician Assistants and Nurse Practitioners) who all work together to provide you with the care you need, when you need it.  We recommend signing up for the patient portal called "MyChart".  Sign up information is provided on this After Visit Summary.  MyChart is used to connect with patients for Virtual Visits (Telemedicine).  Patients are able to view lab/test results, encounter notes, upcoming appointments, etc.  Non-urgent messages can be sent to your provider as well.   To learn more about what you can do with MyChart, go to [email or web address].    Your next appointment:    After stress test   Provider:   You may see Armanda Magic, MD or one of the following Advanced Practice Providers on your designated Care Team:   Randall An, PA-C  Jacolyn Reedy, PA-C     Other Instructions Thank you for choosing [facility]!

## 2023-12-12 NOTE — Telephone Encounter (Addendum)
 CSW received referral to assist patient with transportation to appointment on 12-27-23. CSW contacted patient who states that her son will be able to transport her and denies any need at this time. CSW available if need arises. Lasandra Beech, LCSW, CCSW-MCS [phone]

## 2023-12-13 ENCOUNTER — Ambulatory Visit: Payer: Medicare HMO | Admitting: Neurology

## 2023-12-14 DIAGNOSIS — M25511 Pain in right shoulder: Secondary | ICD-10-CM | POA: Diagnosis not present

## 2023-12-14 DIAGNOSIS — M25512 Pain in left shoulder: Secondary | ICD-10-CM | POA: Diagnosis not present

## 2023-12-14 DIAGNOSIS — M545 Low back pain, unspecified: Secondary | ICD-10-CM | POA: Diagnosis not present

## 2023-12-14 DIAGNOSIS — M542 Cervicalgia: Secondary | ICD-10-CM | POA: Diagnosis not present

## 2023-12-14 DIAGNOSIS — M47816 Spondylosis without myelopathy or radiculopathy, lumbar region: Secondary | ICD-10-CM | POA: Diagnosis not present

## 2023-12-14 DIAGNOSIS — M6281 Muscle weakness (generalized): Secondary | ICD-10-CM | POA: Diagnosis not present

## 2023-12-18 DIAGNOSIS — G629 Polyneuropathy, unspecified: Secondary | ICD-10-CM | POA: Diagnosis not present

## 2023-12-18 DIAGNOSIS — Z79899 Other long term (current) drug therapy: Secondary | ICD-10-CM | POA: Diagnosis not present

## 2023-12-18 DIAGNOSIS — M47812 Spondylosis without myelopathy or radiculopathy, cervical region: Secondary | ICD-10-CM | POA: Diagnosis not present

## 2023-12-18 DIAGNOSIS — E119 Type 2 diabetes mellitus without complications: Secondary | ICD-10-CM | POA: Diagnosis not present

## 2023-12-20 DIAGNOSIS — M25511 Pain in right shoulder: Secondary | ICD-10-CM | POA: Diagnosis not present

## 2023-12-20 DIAGNOSIS — M542 Cervicalgia: Secondary | ICD-10-CM | POA: Diagnosis not present

## 2023-12-20 DIAGNOSIS — Z79899 Other long term (current) drug therapy: Secondary | ICD-10-CM | POA: Diagnosis not present

## 2023-12-20 DIAGNOSIS — M6281 Muscle weakness (generalized): Secondary | ICD-10-CM | POA: Diagnosis not present

## 2023-12-20 DIAGNOSIS — M545 Low back pain, unspecified: Secondary | ICD-10-CM | POA: Diagnosis not present

## 2023-12-20 DIAGNOSIS — M25512 Pain in left shoulder: Secondary | ICD-10-CM | POA: Diagnosis not present

## 2023-12-20 DIAGNOSIS — M47816 Spondylosis without myelopathy or radiculopathy, lumbar region: Secondary | ICD-10-CM | POA: Diagnosis not present

## 2023-12-26 ENCOUNTER — Telehealth (HOSPITAL_COMMUNITY): Payer: Self-pay

## 2023-12-27 ENCOUNTER — Ambulatory Visit (HOSPITAL_COMMUNITY): Admission: RE | Admit: 2023-12-27 | Source: Ambulatory Visit

## 2023-12-27 ENCOUNTER — Inpatient Hospital Stay (HOSPITAL_COMMUNITY): Admission: RE | Admit: 2023-12-27 | Source: Ambulatory Visit

## 2024-02-01 DIAGNOSIS — Z13220 Encounter for screening for lipoid disorders: Secondary | ICD-10-CM | POA: Diagnosis not present

## 2024-02-01 DIAGNOSIS — D649 Anemia, unspecified: Secondary | ICD-10-CM | POA: Diagnosis not present

## 2024-02-01 DIAGNOSIS — R5383 Other fatigue: Secondary | ICD-10-CM | POA: Diagnosis not present

## 2024-02-01 DIAGNOSIS — Z1329 Encounter for screening for other suspected endocrine disorder: Secondary | ICD-10-CM | POA: Diagnosis not present

## 2024-02-01 DIAGNOSIS — I10 Essential (primary) hypertension: Secondary | ICD-10-CM | POA: Diagnosis not present

## 2024-02-01 DIAGNOSIS — E1165 Type 2 diabetes mellitus with hyperglycemia: Secondary | ICD-10-CM | POA: Diagnosis not present

## 2024-02-02 DIAGNOSIS — Z1389 Encounter for screening for other disorder: Secondary | ICD-10-CM | POA: Diagnosis not present

## 2024-02-02 DIAGNOSIS — Z0001 Encounter for general adult medical examination with abnormal findings: Secondary | ICD-10-CM | POA: Diagnosis not present

## 2024-02-02 DIAGNOSIS — Z0000 Encounter for general adult medical examination without abnormal findings: Secondary | ICD-10-CM | POA: Diagnosis not present

## 2024-02-07 DIAGNOSIS — M542 Cervicalgia: Secondary | ICD-10-CM | POA: Diagnosis not present

## 2024-02-07 DIAGNOSIS — E1165 Type 2 diabetes mellitus with hyperglycemia: Secondary | ICD-10-CM | POA: Diagnosis not present

## 2024-02-07 DIAGNOSIS — R5383 Other fatigue: Secondary | ICD-10-CM | POA: Diagnosis not present

## 2024-02-20 DIAGNOSIS — E1142 Type 2 diabetes mellitus with diabetic polyneuropathy: Secondary | ICD-10-CM | POA: Diagnosis not present

## 2024-02-20 DIAGNOSIS — E1129 Type 2 diabetes mellitus with other diabetic kidney complication: Secondary | ICD-10-CM | POA: Diagnosis not present

## 2024-02-20 DIAGNOSIS — N390 Urinary tract infection, site not specified: Secondary | ICD-10-CM | POA: Diagnosis not present

## 2024-02-20 DIAGNOSIS — M5030 Other cervical disc degeneration, unspecified cervical region: Secondary | ICD-10-CM | POA: Diagnosis not present

## 2024-02-20 DIAGNOSIS — Z79899 Other long term (current) drug therapy: Secondary | ICD-10-CM | POA: Diagnosis not present

## 2024-02-20 DIAGNOSIS — R0602 Shortness of breath: Secondary | ICD-10-CM | POA: Diagnosis not present

## 2024-02-20 DIAGNOSIS — A499 Bacterial infection, unspecified: Secondary | ICD-10-CM | POA: Diagnosis not present

## 2024-02-24 ENCOUNTER — Emergency Department (HOSPITAL_COMMUNITY)

## 2024-02-24 ENCOUNTER — Encounter (HOSPITAL_COMMUNITY): Payer: Self-pay | Admitting: Emergency Medicine

## 2024-02-24 ENCOUNTER — Emergency Department (HOSPITAL_COMMUNITY)
Admission: EM | Admit: 2024-02-24 | Discharge: 2024-02-25 | Disposition: A | Discharge status: Home / Self Care | Attending: Emergency Medicine | Admitting: Emergency Medicine

## 2024-02-24 ENCOUNTER — Other Ambulatory Visit: Payer: Self-pay

## 2024-02-24 DIAGNOSIS — R0789 Other chest pain: Secondary | ICD-10-CM | POA: Diagnosis present

## 2024-02-24 DIAGNOSIS — F430 Acute stress reaction: Secondary | ICD-10-CM | POA: Diagnosis not present

## 2024-02-24 DIAGNOSIS — R079 Chest pain, unspecified: Secondary | ICD-10-CM

## 2024-02-24 DIAGNOSIS — F439 Reaction to severe stress, unspecified: Secondary | ICD-10-CM

## 2024-02-24 DIAGNOSIS — R739 Hyperglycemia, unspecified: Secondary | ICD-10-CM | POA: Diagnosis not present

## 2024-02-24 DIAGNOSIS — R000 Tachycardia, unspecified: Secondary | ICD-10-CM | POA: Diagnosis not present

## 2024-02-24 MED ORDER — IBUPROFEN 400 MG PO TABS
400.0000 mg | ORAL_TABLET | Freq: Once | ORAL | Status: AC
  Administered 2024-02-25: 400 mg via ORAL
  Filled 2024-02-24: qty 1

## 2024-02-24 MED ORDER — OXYCODONE-ACETAMINOPHEN 5-325 MG PO TABS
1.0000 | ORAL_TABLET | Freq: Once | ORAL | Status: AC
  Administered 2024-02-25: 1 via ORAL
  Filled 2024-02-24: qty 1

## 2024-02-24 NOTE — ED Provider Notes (Signed)
 [facility] AT [facility] Provider Note   CSN: [id number] Arrival date & time: 02/24/24  2240     History {Add pertinent medical, surgical, social history, OB history to HPI:1} Chief Complaint  Patient presents with   Chest Pain    Cindy Norris is a 68 y.o. female.  Presents to the emergency department for evaluation of chest pain.  Pain has been present for a couple of hours.  She was watching TV when she had onset of a sharp pain in the left chest that radiated to the left arm.  Patient has been experiencing chest pains on and off for quite some time.  Her cardiologist scheduled an outpatient stress test in March but she has not been able to get a ride to complete the test.  Patient also reports that for the last 3 days she has had a headache, sore throat, generalized weakness.       Home Medications Prior to Admission medications   Medication Sig Start Date End Date Taking? Authorizing Provider  albuterol  (VENTOLIN  HFA) 108 (90 Base) MCG/ACT inhaler Inhale 1-2 puffs into the lungs as needed for wheezing or shortness of breath.    [provider]  atorvastatin  (LIPITOR ) 80 MG tablet Take 80 mg by mouth at bedtime.    [provider]  busPIRone  (BUSPAR ) 10 MG tablet Take 10 mg by mouth 2 (two) times daily.    [provider]  carvedilol  (COREG ) 3.125 MG tablet Take 1 tablet (3.125 mg total) by mouth 2 (two) times daily. 12/12/23 03/11/24  Flo Hummingbird, PA-C  cephALEXin (KEFLEX) 500 MG capsule Take 500-1,000 mg by mouth See admin instructions. Take 1000 mg in the morning and 500 mg at night Patient not taking: Reported on 12/12/2023 12/13/22   [provider]  Dulaglutide  1.5 MG/0.5ML SOPN Inject 1.5 mg into the skin every Wednesday.    [provider]  ezetimibe  (ZETIA ) 10 MG tablet Take 10 mg by mouth daily. 07/29/22   [provider]  gabapentin  (NEURONTIN ) 800 MG tablet Take 800 mg by mouth 3  (three) times daily.    [provider]  HYDROcodone -acetaminophen  (NORCO) 7.5-325 MG tablet Take 1 tablet by mouth every 6 (six) hours as needed for moderate pain. 12/06/22   [provider]  hydrOXYzine  (ATARAX /VISTARIL ) 25 MG tablet Take 25 mg by mouth 2 (two) times daily.    [provider]  ibuprofen (ADVIL,MOTRIN) 800 MG tablet Take 800 mg by mouth every 8 (eight) hours as needed for moderate pain.    [provider]  levETIRAcetam  (KEPPRA ) 1000 MG tablet Take 1,000 mg by mouth 2 (two) times daily.    [provider]  lidocaine  (LIDODERM ) 5 % Place 1 patch onto the skin daily as needed (pain). Remove & Discard patch within 12 hours or as directed by MD    [provider]  metFORMIN  (GLUCOPHAGE -XR) 500 MG 24 hr tablet Take 500 mg by mouth 2 (two) times daily.    [provider]  midodrine  (PROAMATINE ) 10 MG tablet Take 15 mg by mouth 3 (three) times daily. 03/03/21   [provider]  modafinil  (PROVIGIL ) 200 MG tablet Take 400 mg by mouth every morning.     [provider]  Multiple Vitamin (MULTIVITAMIN WITH MINERALS) TABS tablet Take 1 tablet by mouth daily.    [provider]  nabumetone  (RELAFEN ) 750 MG tablet Take 750 mg by mouth 2 (two) times daily. 04/12/21  [provider]  omeprazole (PRILOSEC) 40 MG capsule Take 80 mg by mouth at bedtime. 04/07/21   [provider]  oxybutynin  (DITROPAN ) 5 MG tablet Take 5 mg by mouth 3 (three) times daily.    [provider]  Polyethyl Glycol-Propyl Glycol (SYSTANE OP) Place 1 drop into both eyes 2 (two) times daily.    [provider]  SUMAtriptan  (IMITREX ) 50 MG tablet Take 50-100 mg by mouth every 2 (two) hours as needed for migraine. May repeat in 2 hours if headache persists or recurs.    [provider]  topiramate  (TOPAMAX ) 100 MG tablet Take 100 mg by mouth 2 (two) times daily.    [provider]   venlafaxine  XR (EFFEXOR -XR) 150 MG 24 hr capsule Take 300 mg by mouth daily.    [provider]      Allergies    Sulfonamide derivatives, Dilaudid [hydromorphone hcl], Silicone, Azithromycin, Other, Tape, Erythromycin, Lyrica [pregabalin], Penicillins, and Reglan [metoclopramide]    Review of Systems   Review of Systems  Physical Exam Updated Vital Signs BP (!) 100/56   Pulse 89   Temp 99.1 F (37.3 C) (Oral)   Resp (!) 22   Wt 84 kg   SpO2 97%   BMI 30.82 kg/m  Physical Exam Vitals and nursing note reviewed.  Constitutional:      General: She is not in acute distress.    Appearance: She is well-developed.  HENT:     Head: Normocephalic and atraumatic.     Mouth/Throat:     Mouth: Mucous membranes are moist.  Eyes:     General: Vision grossly intact. Gaze aligned appropriately.     Extraocular Movements: Extraocular movements intact.     Conjunctiva/sclera: Conjunctivae normal.  Cardiovascular:     Rate and Rhythm: Normal rate and regular rhythm.     Pulses: Normal pulses.     Heart sounds: Normal heart sounds, S1 normal and S2 normal. No murmur heard.    No friction rub. No gallop.  Pulmonary:     Effort: Pulmonary effort is normal. No respiratory distress.     Breath sounds: Normal breath sounds.  Abdominal:     General: Bowel sounds are normal.     Palpations: Abdomen is soft.     Tenderness: There is no abdominal tenderness. There is no guarding or rebound.     Hernia: No hernia is present.  Musculoskeletal:        General: No swelling.     Cervical back: Full passive range of motion without pain, normal range of motion and neck supple. No spinous process tenderness or muscular tenderness. Normal range of motion.     Right lower leg: No edema.     Left lower leg: No edema.  Skin:    General: Skin is warm and dry.     Capillary Refill: Capillary refill takes less than 2 seconds.     Findings: No ecchymosis, erythema, rash or wound.  Neurological:      General: No focal deficit present.     Mental Status: She is alert and oriented to person, place, and time.     GCS: GCS eye subscore is 4. GCS verbal subscore is 5. GCS motor subscore is 6.     Cranial Nerves: Cranial nerves 2-12 are intact.     Sensory: Sensation is intact.     Motor: Motor function is intact.     Coordination: Coordination is intact.  Psychiatric:  Attention and Perception: Attention normal.        Mood and Affect: Mood is anxious. Affect is tearful.        Speech: Speech normal.        Behavior: Behavior normal.     ED Results / Procedures / Treatments   Labs (all labs ordered are listed, but only abnormal results are displayed) Labs Reviewed  BASIC METABOLIC PANEL WITH GFR  CBC  TROPONIN I (HIGH SENSITIVITY)    EKG EKG Interpretation Date/Time:  Saturday Feb 24 2024 22:55:36 EDT Ventricular Rate:  90 PR Interval:  144 QRS Duration:  91 QT Interval:  351 QTC Calculation: 430 R Axis:   50  Text Interpretation: Sinus rhythm Minimal ST depression, inferior leads No significant change since last tracing Confirmed by Ballard Bongo [phone]) on 02/24/2024 11:25:35 PM  Radiology DG Chest 2 View Result Date: 02/24/2024 CLINICAL DATA:  Chest pain EXAM: CHEST - 2 VIEW COMPARISON:  Chest x-ray 04/2021 FINDINGS: The heart size and mediastinal contours are within normal limits. Both lungs are clear. The visualized skeletal structures are unremarkable. IMPRESSION: No active cardiopulmonary disease. Electronically Signed   By: Tyron Gallon M.D.   On: 02/24/2024 23:29    Procedures Procedures  {Document cardiac monitor, telemetry assessment procedure when appropriate:1}  Medications Ordered in ED Medications - No data to display  ED Course/ Medical Decision Making/ A&P   {   Click here for ABCD2, HEART and other calculatorsREFRESH Note before signing :1}                              Medical Decision Making Amount and/or Complexity of Data  Reviewed Labs: ordered. Radiology: ordered.   ***  {Document critical care time when appropriate:1} {Document review of labs and clinical decision tools ie heart score, Chads2Vasc2 etc:1}  {Document your independent review of radiology images, and any outside records:1} {Document your discussion with family members, caretakers, and with consultants:1} {Document social determinants of health affecting pt's care:1} {Document your decision making why or why not admission, treatments were needed:1} Final Clinical Impression(s) / ED Diagnoses Final diagnoses:  None    Rx / DC Orders ED Discharge Orders     None

## 2024-02-24 NOTE — ED Triage Notes (Signed)
 Pt in from home via [facility] with L cp that began while sitting and watching tv around 9:30pm. Pain described as pressure, pt given 324mg  ASA PTA. Pt arrives also c/o L sore throat, cough and generalized malaise x 3 days.

## 2024-02-25 DIAGNOSIS — R0789 Other chest pain: Secondary | ICD-10-CM | POA: Diagnosis not present

## 2024-02-25 LAB — CBC
HCT: 38.4 % (ref 36.0–46.0)
Hemoglobin: 13 g/dL (ref 12.0–15.0)
MCH: 30.7 pg (ref 26.0–34.0)
MCHC: 33.9 g/dL (ref 30.0–36.0)
MCV: 90.8 fL (ref 80.0–100.0)
Platelets: 191 10*3/uL (ref 150–400)
RBC: 4.23 MIL/uL (ref 3.87–5.11)
RDW: 11.9 % (ref 11.5–15.5)
WBC: 5.8 10*3/uL (ref 4.0–10.5)
nRBC: 0 % (ref 0.0–0.2)

## 2024-02-25 LAB — BASIC METABOLIC PANEL WITH GFR
Anion gap: 8 (ref 5–15)
BUN: 14 mg/dL (ref 8–23)
CO2: 21 mmol/L — ABNORMAL LOW (ref 22–32)
Calcium: 8.4 mg/dL — ABNORMAL LOW (ref 8.9–10.3)
Chloride: 109 mmol/L (ref 98–111)
Creatinine, Ser: 0.69 mg/dL (ref 0.44–1.00)
GFR, Estimated: >60 mL/min (ref >60)
Glucose, Bld: 330 mg/dL — ABNORMAL HIGH (ref 70–99)
Potassium: 3.5 mmol/L (ref 3.5–5.1)
Sodium: 138 mmol/L (ref 135–145)

## 2024-02-25 LAB — RESP PANEL BY RT-PCR (RSV, FLU A&B, COVID)  RVPGX2
Influenza A by PCR: NEGATIVE
Influenza B by PCR: NEGATIVE
Resp Syncytial Virus by PCR: NEGATIVE
SARS Coronavirus 2 by RT PCR: NEGATIVE

## 2024-02-25 LAB — TROPONIN I (HIGH SENSITIVITY)
Troponin I (High Sensitivity): 3 ng/L (ref <18)
Troponin I (High Sensitivity): 3 ng/L (ref <18)

## 2024-02-27 DIAGNOSIS — Z79899 Other long term (current) drug therapy: Secondary | ICD-10-CM | POA: Diagnosis not present

## 2024-02-28 DIAGNOSIS — R42 Dizziness and giddiness: Secondary | ICD-10-CM | POA: Diagnosis not present

## 2024-02-28 DIAGNOSIS — R0789 Other chest pain: Secondary | ICD-10-CM | POA: Diagnosis not present

## 2024-03-07 ENCOUNTER — Other Ambulatory Visit: Payer: Self-pay

## 2024-03-07 ENCOUNTER — Emergency Department (HOSPITAL_COMMUNITY)

## 2024-03-07 ENCOUNTER — Emergency Department (HOSPITAL_COMMUNITY)
Admission: EM | Admit: 2024-03-07 | Discharge: 2024-03-07 | Disposition: A | Discharge status: Home / Self Care | Attending: Emergency Medicine | Admitting: Emergency Medicine

## 2024-03-07 ENCOUNTER — Encounter (HOSPITAL_COMMUNITY): Payer: Self-pay

## 2024-03-07 DIAGNOSIS — M25512 Pain in left shoulder: Secondary | ICD-10-CM | POA: Diagnosis not present

## 2024-03-07 DIAGNOSIS — Y9234 Swimming pool (public) as the place of occurrence of the external cause: Secondary | ICD-10-CM | POA: Diagnosis not present

## 2024-03-07 DIAGNOSIS — M25519 Pain in unspecified shoulder: Secondary | ICD-10-CM | POA: Diagnosis not present

## 2024-03-07 DIAGNOSIS — W01198A Fall on same level from slipping, tripping and stumbling with subsequent striking against other object, initial encounter: Secondary | ICD-10-CM | POA: Diagnosis not present

## 2024-03-07 DIAGNOSIS — I10 Essential (primary) hypertension: Secondary | ICD-10-CM | POA: Diagnosis not present

## 2024-03-07 DIAGNOSIS — I700 Atherosclerosis of aorta: Secondary | ICD-10-CM | POA: Diagnosis not present

## 2024-03-07 DIAGNOSIS — R9082 White matter disease, unspecified: Secondary | ICD-10-CM | POA: Diagnosis not present

## 2024-03-07 DIAGNOSIS — R079 Chest pain, unspecified: Secondary | ICD-10-CM | POA: Insufficient documentation

## 2024-03-07 DIAGNOSIS — M85812 Other specified disorders of bone density and structure, left shoulder: Secondary | ICD-10-CM | POA: Diagnosis not present

## 2024-03-07 DIAGNOSIS — M19012 Primary osteoarthritis, left shoulder: Secondary | ICD-10-CM | POA: Diagnosis not present

## 2024-03-07 DIAGNOSIS — G8911 Acute pain due to trauma: Secondary | ICD-10-CM | POA: Insufficient documentation

## 2024-03-07 DIAGNOSIS — W19XXXA Unspecified fall, initial encounter: Secondary | ICD-10-CM | POA: Diagnosis not present

## 2024-03-07 DIAGNOSIS — M542 Cervicalgia: Secondary | ICD-10-CM | POA: Diagnosis not present

## 2024-03-07 DIAGNOSIS — S0990XA Unspecified injury of head, initial encounter: Secondary | ICD-10-CM | POA: Diagnosis not present

## 2024-03-07 MED ORDER — HYDROCODONE-ACETAMINOPHEN 5-325 MG PO TABS
1.0000 | ORAL_TABLET | Freq: Once | ORAL | Status: AC
  Administered 2024-03-07: 1 via ORAL
  Filled 2024-03-07: qty 1

## 2024-03-07 MED ORDER — LIDOCAINE 5 % EX PTCH
1.0000 | MEDICATED_PATCH | CUTANEOUS | Status: DC
  Administered 2024-03-07: 1 via TRANSDERMAL
  Filled 2024-03-07: qty 1

## 2024-03-07 MED ORDER — OXYCODONE HCL 5 MG PO TABS: 5.0000 mg | ORAL_TABLET | ORAL | 0 refills | Status: DC | PRN

## 2024-03-07 NOTE — ED Provider Notes (Signed)
 [place] EMERGENCY DEPARTMENT AT [facility] Provider Note   CSN: [id number] Arrival date & time: 03/07/24  1456     Patient presents with: Fall, Shoulder Pain, and Neck Pain   Cindy Norris is a 68 y.o. female.   68 year old female who presents emergency department after a fall.  Patient was at the pool when she slipped on some water  and fell backwards.  Hit her left shoulder has been having pain along her scapula.  Also having some neck pain.  Unsure of head strike.  No LOC.  Has been able to walk since then without significant leg pain.  Not on blood thinners.       Prior to Admission medications   Medication Sig Start Date End Date Taking? Authorizing Provider  oxyCODONE  (ROXICODONE ) 5 MG immediate release tablet Take 1 tablet (5 mg total) by mouth every 4 (four) hours as needed for severe pain (pain score 7-10). 03/07/24  Yes Ninetta Basket, MD  albuterol  (VENTOLIN  HFA) 108 (90 Base) MCG/ACT inhaler Inhale 1-2 puffs into the lungs as needed for wheezing or shortness of breath.    [provider]  atorvastatin  (LIPITOR ) 80 MG tablet Take 80 mg by mouth at bedtime.    [provider]  busPIRone  (BUSPAR ) 10 MG tablet Take 10 mg by mouth 2 (two) times daily.    [provider]  carvedilol  (COREG ) 3.125 MG tablet Take 1 tablet (3.125 mg total) by mouth 2 (two) times daily. 12/12/23 03/11/24  Flo Hummingbird, PA-C  cephALEXin (KEFLEX) 500 MG capsule Take 500-1,000 mg by mouth See admin instructions. Take 1000 mg in the morning and 500 mg at night Patient not taking: Reported on 12/12/2023 12/13/22   [provider]  Dulaglutide  1.5 MG/0.5ML SOPN Inject 1.5 mg into the skin every Wednesday.    [provider]  ezetimibe  (ZETIA ) 10 MG tablet Take 10 mg by mouth daily. 07/29/22   [provider]  gabapentin  (NEURONTIN ) 800 MG tablet Take 800 mg by mouth 3 (three) times daily.    [provider]   HYDROcodone -acetaminophen  (NORCO) 7.5-325 MG tablet Take 1 tablet by mouth every 6 (six) hours as needed for moderate pain. 12/06/22   [provider]  hydrOXYzine  (ATARAX /VISTARIL ) 25 MG tablet Take 25 mg by mouth 2 (two) times daily.    [provider]  ibuprofen  (ADVIL ,MOTRIN ) 800 MG tablet Take 800 mg by mouth every 8 (eight) hours as needed for moderate pain.    [provider]  levETIRAcetam  (KEPPRA ) 1000 MG tablet Take 1,000 mg by mouth 2 (two) times daily.    [provider]  lidocaine  (LIDODERM ) 5 % Place 1 patch onto the skin daily as needed (pain). Remove & Discard patch within 12 hours or as directed by MD    [provider]  metFORMIN  (GLUCOPHAGE -XR) 500 MG 24 hr tablet Take 500 mg by mouth 2 (two) times daily.    [provider]  midodrine  (PROAMATINE ) 10 MG tablet Take 15 mg by mouth 3 (three) times daily. 03/03/21   [provider]  modafinil  (PROVIGIL ) 200 MG tablet Take 400 mg by mouth every morning.     [provider]  Multiple Vitamin (MULTIVITAMIN WITH MINERALS) TABS tablet Take 1 tablet by mouth daily.    [provider]  nabumetone  (RELAFEN ) 750 MG tablet Take 750 mg by mouth 2 (two) times daily. 04/12/21   [provider]  omeprazole (PRILOSEC) 40 MG capsule Take 80 mg  by mouth at bedtime. 04/07/21   [provider]  oxybutynin  (DITROPAN ) 5 MG tablet Take 5 mg by mouth 3 (three) times daily.    [provider]  Polyethyl Glycol-Propyl Glycol (SYSTANE OP) Place 1 drop into both eyes 2 (two) times daily.    [provider]  SUMAtriptan  (IMITREX ) 50 MG tablet Take 50-100 mg by mouth every 2 (two) hours as needed for migraine. May repeat in 2 hours if headache persists or recurs.    [provider]  topiramate  (TOPAMAX ) 100 MG tablet Take 100 mg by mouth 2 (two) times daily.    [provider]  venlafaxine  XR (EFFEXOR -XR) 150 MG 24 hr capsule Take 300  mg by mouth daily.    [provider]    Allergies: Sulfonamide derivatives, Dilaudid [hydromorphone hcl], Silicone, Azithromycin, Other, Tape, Erythromycin, Lyrica [pregabalin], Penicillins, and Reglan [metoclopramide]    Review of Systems  Updated Vital Signs BP 133/61   Pulse 83   Temp 97.9 F (36.6 C) (Oral)   Resp 12   SpO2 97%   Physical Exam Constitutional:      General: She is not in acute distress.    Appearance: Normal appearance. She is not ill-appearing.  HENT:     Head: Normocephalic and atraumatic.     Right Ear: External ear normal.     Left Ear: External ear normal.     Mouth/Throat:     Mouth: Mucous membranes are moist.     Pharynx: Oropharynx is clear.   Eyes:     Extraocular Movements: Extraocular movements intact.     Conjunctiva/sclera: Conjunctivae normal.     Pupils: Pupils are equal, round, and reactive to light.   Neck:     Comments: C-collar in place Cardiovascular:     Rate and Rhythm: Normal rate and regular rhythm.     Pulses: Normal pulses.     Heart sounds: Normal heart sounds.  Pulmonary:     Effort: Pulmonary effort is normal. No respiratory distress.     Breath sounds: Normal breath sounds.     Comments: No rib tenderness to palpation on either side Abdominal:     General: Abdomen is flat. There is no distension.     Palpations: Abdomen is soft. There is no mass.     Tenderness: There is no abdominal tenderness. There is no guarding.   Musculoskeletal:        General: No deformity. Normal range of motion.     Comments: No tenderness to palpation of midline thoracic or lumbar spine.  No step-offs palpated.  No tenderness to palpation of chest wall.  No bruising noted.  No tenderness to palpation of bilateral clavicles.  No tenderness to palpation, bruising, or deformities noted of bilateral elbows, wrists, hips, knees, or ankles.  Tenderness palpation of left scapula but no significant tenderness palpation of the left  shoulder.  Full range of motion of the left shoulder.   Neurological:     General: No focal deficit present.     Mental Status: She is alert and oriented to person, place, and time. Mental status is at baseline.     Cranial Nerves: No cranial nerve deficit.     Sensory: No sensory deficit.     Motor: No weakness.     (all labs ordered are listed, but only abnormal results are displayed) Labs Reviewed - No data to display  EKG: EKG Interpretation Date/Time:  Thursday March 07 2024 15:10:13 EDT Ventricular Rate:  88 PR Interval:  139 QRS Duration:  96 QT Interval:  426 QTC Calculation: 516 R Axis:   59  Text Interpretation: Sinus rhythm Prolonged QT interval Confirmed by Shyrl Doyne [phone]) on 03/07/2024 3:12:30 PM  Radiology: CT Chest Wo Contrast Result Date: 03/07/2024 CLINICAL DATA:  Left scapula pain.  Fall. EXAM: CT CHEST WITHOUT CONTRAST TECHNIQUE: Multidetector CT imaging of the chest was performed following the standard protocol without IV contrast. RADIATION DOSE REDUCTION: This exam was performed according to the departmental dose-optimization program which includes automated exposure control, adjustment of the mA and/or kV according to patient size and/or use of iterative reconstruction technique. COMPARISON:  Left shoulder radiograph dated 03/07/2024 and chest radiograph dated 02/24/2024. FINDINGS: Evaluation of this exam is limited in the absence of intravenous contrast. Cardiovascular: There is no cardiomegaly. Small pericardial effusion measuring 5 mm in thickness. Three vessel coronary vascular calcification. Mild atherosclerotic calcification of the thoracic aorta. No aneurysmal dilatation. The central pulmonary arteries are grossly unremarkable. Mediastinum/Nodes: No hilar or mediastinal adenopathy. The esophagus is grossly unremarkable. No mediastinal fluid collection. Lungs/Pleura: No focal consolidation, pleural effusion, or pneumothorax. The central airways are  patent. Upper Abdomen: No acute abnormality. Partially visualized left renal upper pole calculi and a 6 mm indeterminate right adrenal nodule which was present on the CT of 2009 consistent with a benign etiology. Musculoskeletal: No acute osseous pathology. IMPRESSION: 1. No acute intrathoracic pathology. 2. Small pericardial effusion. 3.  Aortic Atherosclerosis (ICD10-I70.0). Electronically Signed   By: Angus Bark M.D.   On: 03/07/2024 17:49   DG Shoulder Left Result Date: 03/07/2024 CLINICAL DATA:  Left shoulder pain. EXAM: LEFT SHOULDER - 2+ VIEW COMPARISON:  Left shoulder radiograph dated 03/07/2024. FINDINGS: No acute fracture or dislocation. The bones are osteopenic. Mild arthritic changes of the left shoulder. The soft tissues are unremarkable. IMPRESSION: 1. No acute fracture or dislocation. 2. Mild arthritic changes. Electronically Signed   By: Angus Bark M.D.   On: 03/07/2024 16:25   CT Head Wo Contrast Result Date: 03/07/2024 CLINICAL DATA:  Slip and fall EXAM: CT HEAD WITHOUT CONTRAST CT CERVICAL SPINE WITHOUT CONTRAST TECHNIQUE: Multidetector CT imaging of the head and cervical spine was performed following the standard protocol without intravenous contrast. Multiplanar CT image reconstructions of the cervical spine were also generated. RADIATION DOSE REDUCTION: This exam was performed according to the departmental dose-optimization program which includes automated exposure control, adjustment of the mA and/or kV according to patient size and/or use of iterative reconstruction technique. COMPARISON:  07/03/2021 FINDINGS: CT HEAD FINDINGS Brain: No evidence of acute infarction, hemorrhage, hydrocephalus, extra-axial collection or mass lesion/mass effect. Mild periventricular white matter hypodensity. Vascular: No hyperdense vessel or unexpected calcification. Skull: Normal. Negative for fracture or focal lesion. Sinuses/Orbits: No acute finding. Other: None. CT CERVICAL SPINE FINDINGS  Alignment: Degenerative straightening of the normal cervical lordosis. Skull base and vertebrae: No acute fracture. No primary bone lesion or focal pathologic process. Soft tissues and spinal canal: No prevertebral fluid or swelling. No visible canal hematoma. Disc levels: Mild-to-moderate multilevel cervical disc degenerative disease worst from C4-C7. Upper chest: Negative. Other: None. IMPRESSION: 1. No acute intracranial pathology. Small-vessel white matter disease. 2. No fracture or static subluxation of the cervical spine. 3. Mild-to-moderate multilevel cervical disc degenerative disease worst from C4-C7. Electronically Signed   By: Fredricka Jenny M.D.   On: 03/07/2024 16:14   CT Cervical Spine Wo Contrast Result Date: 03/07/2024 CLINICAL DATA:  Slip and fall EXAM: CT HEAD WITHOUT CONTRAST CT  CERVICAL SPINE WITHOUT CONTRAST TECHNIQUE: Multidetector CT imaging of the head and cervical spine was performed following the standard protocol without intravenous contrast. Multiplanar CT image reconstructions of the cervical spine were also generated. RADIATION DOSE REDUCTION: This exam was performed according to the departmental dose-optimization program which includes automated exposure control, adjustment of the mA and/or kV according to patient size and/or use of iterative reconstruction technique. COMPARISON:  07/03/2021 FINDINGS: CT HEAD FINDINGS Brain: No evidence of acute infarction, hemorrhage, hydrocephalus, extra-axial collection or mass lesion/mass effect. Mild periventricular white matter hypodensity. Vascular: No hyperdense vessel or unexpected calcification. Skull: Normal. Negative for fracture or focal lesion. Sinuses/Orbits: No acute finding. Other: None. CT CERVICAL SPINE FINDINGS Alignment: Degenerative straightening of the normal cervical lordosis. Skull base and vertebrae: No acute fracture. No primary bone lesion or focal pathologic process. Soft tissues and spinal canal: No prevertebral fluid or  swelling. No visible canal hematoma. Disc levels: Mild-to-moderate multilevel cervical disc degenerative disease worst from C4-C7. Upper chest: Negative. Other: None. IMPRESSION: 1. No acute intracranial pathology. Small-vessel white matter disease. 2. No fracture or static subluxation of the cervical spine. 3. Mild-to-moderate multilevel cervical disc degenerative disease worst from C4-C7. Electronically Signed   By: Fredricka Jenny M.D.   On: 03/07/2024 16:14   DG Scapula Left Result Date: 03/07/2024 CLINICAL DATA:  Left shoulder/scapular pain after fall EXAM: LEFT SCAPULA - 2+ VIEWS COMPARISON:  10/27/2023 FINDINGS: Stable mild spurring at the [facility] joint. Stable spurring of the inferior glenoid and the humeral head articular margin. No scapular fracture is appreciable on this two view series. IMPRESSION: 1. No scapular fracture is appreciable on this two view series. Some scapular fractures can be occult or subtle on radiography, and if there is point tenderness or pain persists, MRI or CT may be helpful in further workup. 2. Stable degenerative findings at the [facility] joint and glenohumeral joint. Electronically Signed   By: Freida Jes M.D.   On: 03/07/2024 16:05     Procedures   Medications Ordered in the ED  lidocaine  (LIDODERM ) 5 % 1 patch (1 patch Transdermal Patch Applied 03/07/24 1644)  HYDROcodone -acetaminophen  (NORCO/VICODIN) 5-325 MG per tablet 1 tablet (1 tablet Oral Given 03/07/24 1644)                                    Medical Decision Making Amount and/or Complexity of Data Reviewed Radiology: ordered.  Risk Prescription drug management.   68 year old female presents emergency department after a fall with left shoulder pain and neck pain  Initial Ddx:  Scapular fracture, proximal humerus fracture, dislocation, TBI, concussion, C-spine injury  MDM/Course:  Patient presents emergency department with neck pain and left shoulder pain after mechanical fall.  Unsure about head  strike.  Given her age did obtain a CT of the head and cervical spine that did not show acute abnormalities.  Did have x-rays of the left scapula and left shoulder that did not reveal acute abnormality but was having persistent pain so underwent a CT scan of her chest.  No fracture of the scapula or rib fractures.  Did have a small pericardial effusion the patient was informed above.  Given a short course of oxycodone  for pain control and will have her follow-up with sports medicine if she has persistent shoulder pain.  This patient presents to the ED for concern of complaints listed in HPI, this involves an extensive number of treatment options,  and is a complaint that carries with it a high risk of complications and morbidity. Disposition including potential need for admission considered.   Dispo: DC Home. Return precautions discussed including, but not limited to, those listed in the AVS. Allowed pt time to ask questions which were answered fully prior to dc.  Records reviewed Outpatient Clinic Notes I independently reviewed the following imaging with scope of interpretation limited to determining acute life threatening conditions related to emergency care: Extremity x-ray(s) and agree with the radiologist interpretation with the following exceptions: none I personally reviewed and interpreted the pt's EKG: see above for interpretation  I have reviewed the patients home medications and made adjustments as needed Social Determinants of health:  Geriatric  Portions of this note were generated with Scientist, clinical (histocompatibility and immunogenetics). Dictation errors may occur despite best attempts at proofreading.     Final diagnoses:  Fall, initial encounter  Neck pain  Acute pain of left shoulder    ED Discharge Orders          Ordered    oxyCODONE  (ROXICODONE ) 5 MG immediate release tablet  Every 4 hours PRN        03/07/24 1758               Ninetta Basket, MD 03/07/24 1844

## 2024-03-07 NOTE — ED Notes (Signed)
 Pt family member Arlene Ben called asking about the patient. Explained to [facility] that I could not release information over the phone. Hunter's number is [phone]. This Rn helped the patient us  the patients phone to call [facility]

## 2024-03-07 NOTE — Discharge Instructions (Signed)
 You were seen for your shoulder pain in the emergency department.   At home, please take tylenol  and ibuprofen  for your pain. You may also take the oxycodone  we have prescribed you for any breakthrough pain that may have.  Do not take this before driving or operating heavy machinery.  Do not take this medication with alcohol .    Check your MyChart online for the results of any tests that had not resulted by the time you left the emergency department.   Follow-up with your primary doctor in 2-3 days regarding your visit.  Follow-up with the orthopedic doctors if you have persistent shoulder pain.   Return immediately to the emergency department if you experience any of the following: worsening pain, or any other concerning symptoms.    Thank you for visiting our Emergency Department. It was a pleasure taking care of you today.

## 2024-03-07 NOTE — ED Notes (Signed)
 Pt grandson Arlene Ben to arrange transportation for patient to get home. Arlene Ben states it will take about 56mins-1hour/

## 2024-03-07 NOTE — ED Triage Notes (Signed)
 Per EMS  Fall Slipped on water  Was in changing room at the pool Neck pain C-collar placed by EMS Left shoulder pain Radiating down arm

## 2024-03-11 DIAGNOSIS — M25512 Pain in left shoulder: Secondary | ICD-10-CM | POA: Diagnosis not present

## 2024-03-11 DIAGNOSIS — S20222A Contusion of left back wall of thorax, initial encounter: Secondary | ICD-10-CM | POA: Diagnosis not present

## 2024-03-18 DIAGNOSIS — R059 Cough, unspecified: Secondary | ICD-10-CM | POA: Diagnosis not present

## 2024-03-18 DIAGNOSIS — R051 Acute cough: Secondary | ICD-10-CM | POA: Diagnosis not present

## 2024-03-18 DIAGNOSIS — R079 Chest pain, unspecified: Secondary | ICD-10-CM | POA: Diagnosis not present

## 2024-03-18 DIAGNOSIS — H109 Unspecified conjunctivitis: Secondary | ICD-10-CM | POA: Diagnosis not present

## 2024-03-18 DIAGNOSIS — S20222A Contusion of left back wall of thorax, initial encounter: Secondary | ICD-10-CM | POA: Diagnosis not present

## 2024-03-18 DIAGNOSIS — M25512 Pain in left shoulder: Secondary | ICD-10-CM | POA: Diagnosis not present

## 2024-03-22 DIAGNOSIS — E559 Vitamin D deficiency, unspecified: Secondary | ICD-10-CM | POA: Diagnosis not present

## 2024-03-22 DIAGNOSIS — Z794 Long term (current) use of insulin: Secondary | ICD-10-CM | POA: Diagnosis not present

## 2024-03-22 DIAGNOSIS — R768 Other specified abnormal immunological findings in serum: Secondary | ICD-10-CM | POA: Diagnosis not present

## 2024-03-22 DIAGNOSIS — E1142 Type 2 diabetes mellitus with diabetic polyneuropathy: Secondary | ICD-10-CM | POA: Diagnosis not present

## 2024-03-22 DIAGNOSIS — Z79899 Other long term (current) drug therapy: Secondary | ICD-10-CM | POA: Diagnosis not present

## 2024-03-22 DIAGNOSIS — M5030 Other cervical disc degeneration, unspecified cervical region: Secondary | ICD-10-CM | POA: Diagnosis not present

## 2024-03-26 DIAGNOSIS — Z79899 Other long term (current) drug therapy: Secondary | ICD-10-CM | POA: Diagnosis not present

## 2024-04-24 DIAGNOSIS — R768 Other specified abnormal immunological findings in serum: Secondary | ICD-10-CM | POA: Diagnosis not present

## 2024-04-24 DIAGNOSIS — M5030 Other cervical disc degeneration, unspecified cervical region: Secondary | ICD-10-CM | POA: Diagnosis not present

## 2024-04-24 DIAGNOSIS — E559 Vitamin D deficiency, unspecified: Secondary | ICD-10-CM | POA: Diagnosis not present

## 2024-04-24 DIAGNOSIS — E1142 Type 2 diabetes mellitus with diabetic polyneuropathy: Secondary | ICD-10-CM | POA: Diagnosis not present

## 2024-04-24 DIAGNOSIS — Z794 Long term (current) use of insulin: Secondary | ICD-10-CM | POA: Diagnosis not present

## 2024-04-24 DIAGNOSIS — Z79899 Other long term (current) drug therapy: Secondary | ICD-10-CM | POA: Diagnosis not present

## 2024-04-29 ENCOUNTER — Ambulatory Visit: Admitting: Neurology

## 2024-04-29 ENCOUNTER — Encounter: Payer: Self-pay | Admitting: Neurology

## 2024-04-29 DIAGNOSIS — Z79899 Other long term (current) drug therapy: Secondary | ICD-10-CM | POA: Diagnosis not present

## 2024-04-29 NOTE — Progress Notes (Deleted)
 NEUROLOGY CONSULTATION NOTE  Cindy Norris MRN: [id number] DOB: 1956-07-26  Referring provider: Rocky Don, PA-C Primary care provider: Rocky Don, PA-C  Reason for consult:  headache and history of seizures.  Assessment/Plan:   ***   Subjective:  Cindy Norris is a 68 year old ***-handed female with narcolepsy, chronic pain syndrome/fibromyalgia, DDD of lumbar spine, osteoarthritis, asthma and history of seizures and kidney stones who presents for headaches and history of seizures.  History supplemented by referring provider's notes.  Migraines: Onset:  *** Location:  *** Quality:  *** Intensity:  ***.  *** denies new headache, thunderclap headache or severe headache that wakes *** from sleep. Aura:  *** Prodrome:  *** Postdrome:  *** Associated symptoms:  ***.  *** denies associated unilateral numbness or weakness. Duration:  *** Frequency:  *** Frequency of abortive medication: *** Triggers:  *** Relieving factors:  *** Activity:  ***  History of seizures: ***  Imaging: 10/03/2014 MRI BRAIN WO:  1. No acute intracranial abnormality or mass. 2. Patchy cerebral white matter T2 hyperintensities, mildly progressed from the prior MRI and nonspecific although suggestive of mild chronic small vessel ischemic disease. 017/13/2017 MRI BRAIN W WO:  No acute intracranial abnormality. Stable since 2016 and largely unremarkable for age MRI appearance of the brain. 03/07/2024 CT HEAD & C-SPINE WO:  1. No acute intracranial pathology. Small-vessel white matter disease. 2. No fracture or static subluxation of the cervical spine. 3. Mild-to-moderate multilevel cervical disc degenerative disease worst from C4-C7.  Past NSAIDS/analgesics:  *** Past abortive triptans:  *** Past abortive ergotamine:  *** Past muscle relaxants:  *** Past anti-emetic:  *** Past antihypertensive medications:  *** Past antidepressant medications:  *** Past anticonvulsant medications:  *** Past  anti-CGRP:  *** Past vitamins/Herbal/Supplements:  *** Past antihistamines/decongestants:  *** Other past therapies:  ***  Current NSAIDS/analgesics:  ASA 81mg , ibuprofen  800mg , lidocaine  patch, hydrocodone -acetaminophen  Current triptans:  sumatriptan  50mg -100mg  PRN Current ergotamine:  *** Current anti-emetic:  Zofran  ODT 4mg  Current muscle relaxants:  cyclobenzaprine 10mg  BID Current Antihypertensive medications:  *** Current Antidepressant medications:  venlafaxine  ER 300mg  daily Current Anticonvulsant medications:  topiramate  100mg  BID, gabapentin  800mg  BID, Keppra  1000mg  BID Current anti-CGRP:  *** Current Vitamins/Herbal/Supplements:  *** Current Antihistamines/Decongestants:  *** Other therapy:  *** Birth control:  *** Other medications:  Ritalin, alprazolam, Ozempic, midodrine , albuterol , buspirone , Ambien , modafinil     Caffeine:  *** Alcohol :  *** Smoker:  *** Diet:  *** Exercise:  *** Depression:  ***; Anxiety:  *** Other pain:  *** Sleep hygiene:  *** Family history of headache:  ***      PAST MEDICAL HISTORY: Past Medical History:  Diagnosis Date   Asthma    Chronic pain syndrome    Degenerative disc disease, lumbar    Depression    Diabetes mellitus    GERD (gastroesophageal reflux disease)    History of kidney stones    Hyperlipidemia, mixed    Migraines    Narcolepsy    Osteoarthritis    Overweight(278.02)    Obesity   Seizures (HCC)    unknown etiology-on meds-last seizure was several years ago   Unspecified essential hypertension     PAST SURGICAL HISTORY: Past Surgical History:  Procedure Laterality Date   BREAST MASS EXCISION Right    CATARACT EXTRACTION W/PHACO Left 05/09/2022   Procedure: CATARACT EXTRACTION PHACO AND INTRAOCULAR LENS PLACEMENT (IOC);  Surgeon: Harrie Agent, MD;  Location: [facility];  Service: Ophthalmology;  Laterality: Left;  CDE: 10.22  CATARACT EXTRACTION W/PHACO Right 05/23/2022   Procedure: CATARACT EXTRACTION  PHACO AND INTRAOCULAR LENS PLACEMENT (IOC);  Surgeon: Harrie Agent, MD;  Location: [facility];  Service: Ophthalmology;  Laterality: Right;  CDE 7.15   IR NEPHROURETERAL CATH PLACE RIGHT  12/21/2022   KNEE ARTHROSCOPY     NEPHROLITHOTOMY Right 12/22/2022   Procedure: NEPHROLITHOTOMY PERCUTANEOUS;  Surgeon: Sherrilee Belvie CROME, MD;  Location: [facility];  Service: Urology;  Laterality: Right;   PILONIDAL CYST EXCISION     SHOULDER SURGERY Left    TOTAL KNEE ARTHROPLASTY Bilateral     MEDICATIONS: Current Outpatient Medications on File Prior to Visit  Medication Sig Dispense Refill   albuterol  (VENTOLIN  HFA) 108 (90 Base) MCG/ACT inhaler Inhale 1-2 puffs into the lungs as needed for wheezing or shortness of breath.     atorvastatin  (LIPITOR ) 80 MG tablet Take 80 mg by mouth at bedtime.     busPIRone  (BUSPAR ) 10 MG tablet Take 10 mg by mouth 2 (two) times daily.     carvedilol  (COREG ) 3.125 MG tablet Take 1 tablet (3.125 mg total) by mouth 2 (two) times daily. 180 tablet 3   cephALEXin (KEFLEX) 500 MG capsule Take 500-1,000 mg by mouth See admin instructions. Take 1000 mg in the morning and 500 mg at night (Patient not taking: Reported on 12/12/2023)     Dulaglutide  1.5 MG/0.5ML SOPN Inject 1.5 mg into the skin every Wednesday.     ezetimibe  (ZETIA ) 10 MG tablet Take 10 mg by mouth daily.     gabapentin  (NEURONTIN ) 800 MG tablet Take 800 mg by mouth 3 (three) times daily.     HYDROcodone -acetaminophen  (NORCO) 7.5-325 MG tablet Take 1 tablet by mouth every 6 (six) hours as needed for moderate pain.     hydrOXYzine  (ATARAX /VISTARIL ) 25 MG tablet Take 25 mg by mouth 2 (two) times daily.     ibuprofen  (ADVIL ,MOTRIN ) 800 MG tablet Take 800 mg by mouth every 8 (eight) hours as needed for moderate pain.     levETIRAcetam  (KEPPRA ) 1000 MG tablet Take 1,000 mg by mouth 2 (two) times daily.     lidocaine  (LIDODERM ) 5 % Place 1 patch onto the skin daily as needed (pain). Remove & Discard patch within 12 hours or  as directed by MD     metFORMIN  (GLUCOPHAGE -XR) 500 MG 24 hr tablet Take 500 mg by mouth 2 (two) times daily.     midodrine  (PROAMATINE ) 10 MG tablet Take 15 mg by mouth 3 (three) times daily.     modafinil  (PROVIGIL ) 200 MG tablet Take 400 mg by mouth every morning.      Multiple Vitamin (MULTIVITAMIN WITH MINERALS) TABS tablet Take 1 tablet by mouth daily.     nabumetone  (RELAFEN ) 750 MG tablet Take 750 mg by mouth 2 (two) times daily.     omeprazole (PRILOSEC) 40 MG capsule Take 80 mg by mouth at bedtime.     oxybutynin  (DITROPAN ) 5 MG tablet Take 5 mg by mouth 3 (three) times daily.     oxyCODONE  (ROXICODONE ) 5 MG immediate release tablet Take 1 tablet (5 mg total) by mouth every 4 (four) hours as needed for severe pain (pain score 7-10). 12 tablet 0   Polyethyl Glycol-Propyl Glycol (SYSTANE OP) Place 1 drop into both eyes 2 (two) times daily.     SUMAtriptan  (IMITREX ) 50 MG tablet Take 50-100 mg by mouth every 2 (two) hours as needed for migraine. May repeat in 2 hours if headache persists or recurs.  topiramate  (TOPAMAX ) 100 MG tablet Take 100 mg by mouth 2 (two) times daily.     venlafaxine  XR (EFFEXOR -XR) 150 MG 24 hr capsule Take 300 mg by mouth daily.     No current facility-administered medications on file prior to visit.    ALLERGIES: Allergies  Allergen Reactions   Sulfonamide Derivatives Hives and Rash   Dilaudid [Hydromorphone Hcl] Palpitations and Rash   Silicone Other (See Comments)    blisters   Azithromycin Nausea And Vomiting   Other     Dawn dish detergent-RASH   Tape Other (See Comments)    Blister   Erythromycin Nausea Only   Lyrica [Pregabalin] Swelling and Rash   Penicillins Nausea Only   Reglan [Metoclopramide] Palpitations    FAMILY HISTORY: Family History  Problem Relation Age of Onset   Coronary artery disease Other     Objective:  *** General: No acute distress.  Patient appears well-groomed.   Head:  Normocephalic/atraumatic Eyes:  fundi  examined but not visualized Neck: supple, no paraspinal tenderness, full range of motion Heart: regular rate and rhythm Neurological Exam: Mental status: alert and oriented to person, place, and time, speech fluent and not dysarthric, language intact. Cranial nerves: CN I: not tested CN II: pupils equal, round and reactive to light, visual fields intact CN III, IV, VI:  full range of motion, no nystagmus, no ptosis CN V: facial sensation intact. CN VII: upper and lower face symmetric CN VIII: hearing intact CN IX, X: gag intact, uvula midline CN XI: sternocleidomastoid and trapezius muscles intact CN XII: tongue midline Bulk & Tone: normal, no fasciculations. Motor:  muscle strength 5/5 throughout Sensation:  Pinprick, temperature and vibratory sensation intact. Deep Tendon Reflexes:  2+ throughout,  toes downgoing.   Finger to nose testing:  Without dysmetria.   Heel to shin:  Without dysmetria.   Gait:  Normal station and stride.  Romberg negative.    Thank you for allowing me to take part in the care of this patient.  Juliene Dunnings, DO  CC: ***

## 2024-05-03 ENCOUNTER — Telehealth: Payer: Self-pay | Admitting: Internal Medicine

## 2024-05-03 NOTE — Telephone Encounter (Signed)
 STAT if patient feels like he/she is going to faint   Are you dizzy, lightheaded, or faint now? No  Have you passed out? No IF YES MOVE TO .SYNCOPECVD  Do you have any other symptoms? Balanced is off and feeling fatigue   Have you checked your HR and BP (record if available)? Normally it's alright.  Patient requested we called her at the number of [phone]. Please advise.

## 2024-05-06 DIAGNOSIS — N390 Urinary tract infection, site not specified: Secondary | ICD-10-CM | POA: Diagnosis not present

## 2024-05-06 DIAGNOSIS — R300 Dysuria: Secondary | ICD-10-CM | POA: Diagnosis not present

## 2024-05-06 NOTE — Telephone Encounter (Signed)
 Left message for patient to call back

## 2024-05-06 NOTE — Telephone Encounter (Signed)
 Spoke with patient regarding her SOB. She stated that she had fell which was back in June. She was told then she had some fluid building up around her heart. Reported that her SOB has gotten worse in the last month. Happens whether she is just sitting not doing anything or moving around. Did ask if she was experiencing any swelling in her hands or feet, reported none at this time. I advised her if her symptoms get worse will need to go to the ER for further evaluation. Also wanted to see if she could get an appointment to be seen as well. Routed to Dr. Mallipeddi for further recommendations.

## 2024-05-06 NOTE — Telephone Encounter (Signed)
 Pt returning call. Pt sounds SOB on the phone   Pt c/o Shortness Of Breath: STAT if SOB developed within the last 24 hours or pt is noticeably SOB on the phone  1. Are you currently SOB (can you hear that pt is SOB on the phone)? Yes   2. How long have you been experiencing SOB? Got worse about a week ago   3. Are you SOB when sitting or when up moving around? Both   4. Are you currently experiencing any other symptoms?  Has a pain her side, she states PCP took a urine sample today and they think its a infection but they haven't got results back to her yet.

## 2024-05-07 ENCOUNTER — Encounter: Admitting: Internal Medicine

## 2024-05-07 NOTE — Progress Notes (Signed)
 Erroneous encounter - please disregard.

## 2024-05-23 ENCOUNTER — Telehealth: Payer: Self-pay

## 2024-05-23 DIAGNOSIS — E119 Type 2 diabetes mellitus without complications: Secondary | ICD-10-CM

## 2024-05-23 DIAGNOSIS — I10 Essential (primary) hypertension: Secondary | ICD-10-CM

## 2024-05-24 ENCOUNTER — Telehealth: Payer: Self-pay

## 2024-05-24 DIAGNOSIS — E1142 Type 2 diabetes mellitus with diabetic polyneuropathy: Secondary | ICD-10-CM | POA: Diagnosis not present

## 2024-05-24 DIAGNOSIS — Z79899 Other long term (current) drug therapy: Secondary | ICD-10-CM | POA: Diagnosis not present

## 2024-05-24 DIAGNOSIS — M5030 Other cervical disc degeneration, unspecified cervical region: Secondary | ICD-10-CM | POA: Diagnosis not present

## 2024-05-24 DIAGNOSIS — E559 Vitamin D deficiency, unspecified: Secondary | ICD-10-CM | POA: Diagnosis not present

## 2024-05-24 DIAGNOSIS — Z794 Long term (current) use of insulin: Secondary | ICD-10-CM | POA: Diagnosis not present

## 2024-05-24 DIAGNOSIS — R768 Other specified abnormal immunological findings in serum: Secondary | ICD-10-CM | POA: Diagnosis not present

## 2024-05-24 DIAGNOSIS — Z9181 History of falling: Secondary | ICD-10-CM | POA: Diagnosis not present

## 2024-05-24 NOTE — Progress Notes (Signed)
 Complex Care Management Note  Care Guide Note 05/24/2024 Name: Cindy Norris MRN: [id number] DOB: [birth year]  Cindy Norris is a 68 y.o. year old female who sees [facility], [place] for primary care. I reached out to Niels KANDICE Dawn by phone today to offer complex care management services.  Cindy Norris was given information about Complex Care Management services today including:   The Complex Care Management services include support from the care team which includes your Nurse Care Manager, Clinical Social Worker, or Pharmacist.  The Complex Care Management team is here to help remove barriers to the health concerns and goals most important to you. Complex Care Management services are voluntary, and the patient may decline or stop services at any time by request to their care team member.   Complex Care Management Consent Status: Patient agreed to services and verbal consent obtained.   Follow up plan:  Telephone appointment with complex care management team member scheduled for:  06/10/24 @ 9 AM  Encounter Outcome:  Patient Scheduled  Leotis Rase [facility] Guide  Direct Dial: [phone]  Fax [phone]

## 2024-05-29 ENCOUNTER — Telehealth: Payer: Self-pay | Admitting: Nurse Practitioner

## 2024-05-29 ENCOUNTER — Ambulatory Visit: Attending: Nurse Practitioner | Admitting: Nurse Practitioner

## 2024-05-29 ENCOUNTER — Encounter: Payer: Self-pay | Admitting: Nurse Practitioner

## 2024-05-29 VITALS — BP 122/70 | HR 88 | Ht 65.0 in | Wt 204.0 lb

## 2024-05-29 DIAGNOSIS — R5382 Chronic fatigue, unspecified: Secondary | ICD-10-CM

## 2024-05-29 DIAGNOSIS — R0609 Other forms of dyspnea: Secondary | ICD-10-CM | POA: Diagnosis not present

## 2024-05-29 DIAGNOSIS — I951 Orthostatic hypotension: Secondary | ICD-10-CM | POA: Diagnosis not present

## 2024-05-29 DIAGNOSIS — R0602 Shortness of breath: Secondary | ICD-10-CM

## 2024-05-29 DIAGNOSIS — R1012 Left upper quadrant pain: Secondary | ICD-10-CM

## 2024-05-29 DIAGNOSIS — R079 Chest pain, unspecified: Secondary | ICD-10-CM

## 2024-05-29 NOTE — Progress Notes (Addendum)
 Cardiology Office Note   Date:  05/29/2024 ID:  Cindy Norris, DOB 1956-07-15, MRN [id number] PCP: Cindy Longs, PA-C  [facility] Providers Cardiologist:  Cindy SHAUNNA Maywood, MD     History of Present Illness Cindy Norris is a 68 y.o. female with a PMH of chest pain, palpitations, orthostatic hypotension, HLD, narcolepsy, and OSA (not on CPAP), who presents today for shortness of breath, poor balance, and fatigue follow-up.  Last seen by Cindy Pavy, PA-C on December 12, 2023. Pt noted intermittent chest pressure, noted with exertion at DOE at the time. Was started on low dose Coreg  for chest pain / palpitations. Stress test was ordered but not completed at the time d/t transportation issues.   She contacted our office noting feeling off balance as well as fatigue.  Patient also noted some shortness of breath, worsening within the past month and noticed at rest and with exertion.  Was recommended she follow-up in the office for further evaluation of her symptoms.  Today she presents for office evaluation.  She admits to several concerns.  She mitts to dyspnea on exertion, as well as mild left-sided chest pressure and left sided pain along left upper quadrant.  Her chest pressure is noted if she typically walks around too much or noticed when cleaning her house, denies any alleviating factors.  She has not tried any medication, sensation has been stable over time.  She admits to fatigue and has known diagnosis of narcolepsy for many years, says she typically sleeps during the daytime.  Denies any alcohol  or illicit drug use.  Does smoke marijuana every once in a while per her report. Denies any palpitations, syncope, presyncope, orthopnea, PND, swelling or significant weight changes, acute bleeding, or claudication.    ROS: Negative. See HPI.   Studies Reviewed  EKG:  EKG Interpretation Date/Time:  Wednesday May 29 2024 15:26:22 EDT Ventricular Rate:  97 PR  Interval:  142 QRS Duration:  86 QT Interval:  336 QTC Calculation: 426 R Axis:   66  Text Interpretation: Normal sinus rhythm Nonspecific ST abnormality When compared with ECG of 07-Mar-2024 15:10, PREVIOUS ECG IS PRESENT Confirmed by Cindy Norris [phone]) on 05/29/2024 3:38:01 PM   Echo 03/2021:  1. Left ventricular ejection fraction, by estimation, is 55 to 60%. The  left ventricle has normal function. The left ventricle has no regional  wall motion abnormalities. Left ventricular diastolic parameters are  consistent with Grade I diastolic  dysfunction (impaired relaxation). Elevated left atrial pressure.   2. Right ventricular systolic function is normal. The right ventricular  size is normal. Tricuspid regurgitation signal is inadequate for assessing  PA pressure.   3. The mitral valve is normal in structure. No evidence of mitral valve  regurgitation. No evidence of mitral stenosis.   4. The aortic valve is tricuspid. Aortic valve regurgitation is not  visualized. No aortic stenosis is present.   5. The inferior vena cava is normal in size with greater than 50%  respiratory variability, suggesting right atrial pressure of 3 mmHg.  Cardiac monitor 02/2021:  ZIO XT reviewed. 13 days, 17 hours analyzed. Predominant rhythm is sinus with heart rate ranging from 58 bpm up to 152 bpm and average heart rate 95 bpm. There were rare PACs including couplets and triplets representing less than 1% total beats. There were rare PVCs including couplets representing less than 1% total beats. Three episodes of SVT were noted, the longest of which lasted for approximately 12 seconds with average  heart rate 148 bpm. No pauses.  Lexiscan  02/2021:  There was no ST segment deviation noted during stress. The study is normal. There are no perfusion defects consistent with prior infarct or current ischemia. This is a low risk study. The left ventricular ejection fraction is low normal (50%).  Risk  Assessment/Calculations     The 10-year ASCVD risk score (Arnett DK, et al., 2019) is: 11.2%   Values used to calculate the score:     Age: 32 years     Clincally relevant sex: Female     Is Non-Hispanic African American: No     Diabetic: Yes     Tobacco smoker: No     Systolic Blood Pressure: 122 mmHg     Is BP treated: No     HDL Cholesterol: 69 mg/dL     Total Cholesterol: 155 mg/dL  Physical Exam VS:  BP 122/70   Pulse 88   Ht 5' 5 (1.651 m)   Wt 204 lb (92.5 kg)   SpO2 98%   BMI 33.95 kg/m        Wt Readings from Last 3 Encounters:  05/29/24 204 lb (92.5 kg)  02/24/24 185 lb 3 oz (84 kg)  12/12/23 185 lb 3.2 oz (84 kg)    GEN: Well nourished, well developed in no acute distress, appears fatigued NECK: No JVD; No carotid bruits CARDIAC: S1/S2, RRR, no murmurs, rubs, gallops RESPIRATORY:  Clear to auscultation without rales, wheezing or rhonchi  ABDOMEN: Soft, non-tender, non-distended EXTREMITIES:  No edema; No deformity   ASSESSMENT AND PLAN  Chest pain of uncertain etiology, DOE Etiology unclear and multifactorial. EKG today is without acute ischemic changes. NST in 2022 was normal. She has had issues with arrange NST in the past d/t transpiration issues. Will arrange.  Did discuss risk versus benefits of Lexi scan, and she verbalized understanding is agreeable to proceed. Will also arrange Echo for evaluation of DOE.  No medication changes at this time. Heart healthy diet and regular cardiovascular exercise encouraged.  Care and ED precautions discussed. Discussed to avoid marijuana and risks associated with that.     Informed Consent   Shared Decision Making/Informed Consent The risks [chest pain, shortness of breath, cardiac arrhythmias, dizziness, blood pressure fluctuations, myocardial infarction, stroke/transient ischemic attack, nausea, vomiting, allergic reaction, radiation exposure, metallic taste sensation and life-threatening complications (estimated to be  1 in 10,000)], benefits (risk stratification, diagnosing coronary artery disease, treatment guidance) and alternatives of a nuclear stress test were discussed in detail with Cindy Norris and she agrees to proceed.      2. Orthostatic hypotension Orthostatics today were non-diagnostic, did feel dizzy/wobbly with position changes. Continue midodrine . Care and ED precautions discussed.   3. Fatigue Etiology multifactorial. Known hx of narcolepsy. Does not see a specialist for this and instructed her to speak to PCP regarding this. Will update Echo as noted above.   4. Abdominal pain Note to be LUQ. Recommended to f/u with PCP regarding this. Care and ED precautions discussed.    Dispo: Follow-up with MD/APP in 6-8 weeks or sooner if anything changes.   Signed, Almarie Crate, NP

## 2024-05-29 NOTE — Telephone Encounter (Signed)
 Checking percert on the following patient for testing scheduled at [facility]    EXERCISE STRESS  06/05/2024

## 2024-05-29 NOTE — Telephone Encounter (Signed)
 Patient needs to know about her mediations on the morning of her stress testing.

## 2024-05-29 NOTE — Patient Instructions (Addendum)
 Medication Instructions:  Your physician recommends that you continue on your current medications as directed. Please refer to the Current Medication list given to you today.  Labwork: None   Testing/Procedures: Your physician has requested that you have an echocardiogram. Echocardiography is a painless test that uses sound waves to create images of your heart. It provides your doctor with information about the size and shape of your heart and how well your heart's chambers and valves are working. This procedure takes approximately one hour. There are no restrictions for this procedure. Please do NOT wear cologne, perfume, aftershave, or lotions (deodorant is allowed). Please arrive 15 minutes prior to your appointment time.  Please note: We ask at that you not bring children with you during ultrasound (echo/ vascular) testing. Due to room size and safety concerns, children are not allowed in the ultrasound rooms during exams. Our front office staff cannot provide observation of children in our lobby area while testing is being conducted. An adult accompanying a patient to their appointment will only be allowed in the ultrasound room at the discretion of the ultrasound technician under special circumstances. We apologize for any inconvenience.  Follow-Up: Your physician recommends that you schedule a follow-up appointment in: 6-8 weeks   Any Other Special Instructions Will Be Listed Below (If Applicable).  If you need a refill on your cardiac medications before your next appointment, please call your pharmacy.

## 2024-05-30 DIAGNOSIS — E1142 Type 2 diabetes mellitus with diabetic polyneuropathy: Secondary | ICD-10-CM | POA: Diagnosis not present

## 2024-05-30 DIAGNOSIS — Z9181 History of falling: Secondary | ICD-10-CM | POA: Diagnosis not present

## 2024-05-30 DIAGNOSIS — Z794 Long term (current) use of insulin: Secondary | ICD-10-CM | POA: Diagnosis not present

## 2024-05-30 DIAGNOSIS — Z79899 Other long term (current) drug therapy: Secondary | ICD-10-CM | POA: Diagnosis not present

## 2024-05-30 DIAGNOSIS — E559 Vitamin D deficiency, unspecified: Secondary | ICD-10-CM | POA: Diagnosis not present

## 2024-05-30 DIAGNOSIS — R768 Other specified abnormal immunological findings in serum: Secondary | ICD-10-CM | POA: Diagnosis not present

## 2024-05-30 DIAGNOSIS — M5030 Other cervical disc degeneration, unspecified cervical region: Secondary | ICD-10-CM | POA: Diagnosis not present

## 2024-06-05 ENCOUNTER — Other Ambulatory Visit (HOSPITAL_COMMUNITY)

## 2024-06-05 ENCOUNTER — Telehealth: Payer: Self-pay | Admitting: Nurse Practitioner

## 2024-06-05 ENCOUNTER — Ambulatory Visit (HOSPITAL_COMMUNITY)

## 2024-06-05 NOTE — Telephone Encounter (Signed)
 Checking percert on the following patient for testing scheduled at [facility].    Patients stress test that was scheduled for today 06-05-24 at [facility] has been re-scheduled for 06/12/2024

## 2024-06-10 ENCOUNTER — Other Ambulatory Visit: Payer: Self-pay | Admitting: Licensed Clinical Social Worker

## 2024-06-10 DIAGNOSIS — D649 Anemia, unspecified: Secondary | ICD-10-CM | POA: Diagnosis not present

## 2024-06-10 DIAGNOSIS — E039 Hypothyroidism, unspecified: Secondary | ICD-10-CM | POA: Diagnosis not present

## 2024-06-10 DIAGNOSIS — E7849 Other hyperlipidemia: Secondary | ICD-10-CM | POA: Diagnosis not present

## 2024-06-10 DIAGNOSIS — E1165 Type 2 diabetes mellitus with hyperglycemia: Secondary | ICD-10-CM | POA: Diagnosis not present

## 2024-06-10 DIAGNOSIS — I10 Essential (primary) hypertension: Secondary | ICD-10-CM | POA: Diagnosis not present

## 2024-06-10 LAB — HEMOGLOBIN A1C: Hemoglobin A1C: 7.7

## 2024-06-10 NOTE — Patient Outreach (Signed)
 Complex Care Management   Visit Note  06/10/2024  Name:  TEMPEST FRANKLAND MRN: [id number] DOB: 12-May-1956  Situation: Referral received for Complex Care Management related to Mental/Behavioral Health diagnosis anxiety/depression. Patient reports that she is currently in the car and on the way to [place] for a lab work appointment and is asking for this initial [facility] LCSW appointment to be reschedule for 06/20/24 at 1 pm   Background:   Past Medical History:  Diagnosis Date   Asthma    Chronic pain syndrome    Degenerative disc disease, lumbar    Depression    Diabetes mellitus    GERD (gastroesophageal reflux disease)    History of kidney stones    Hyperlipidemia, mixed    Migraines    Narcolepsy    Osteoarthritis    Overweight(278.02)    Obesity   Seizures (HCC)    unknown etiology-on meds-last seizure was several years ago   Unspecified essential hypertension     Follow Up Plan:   Appointment successfully rescheduled with patient for next week.  Lyle Rung, BSW, MSW, LCSW Licensed Clinical Social Worker [employer] [facility]   [facility] [email or web address] Direct Dial: [phone]

## 2024-06-12 ENCOUNTER — Inpatient Hospital Stay (HOSPITAL_COMMUNITY): Admission: RE | Admit: 2024-06-12 | Source: Ambulatory Visit

## 2024-06-12 ENCOUNTER — Ambulatory Visit (HOSPITAL_COMMUNITY)

## 2024-06-13 DIAGNOSIS — E1142 Type 2 diabetes mellitus with diabetic polyneuropathy: Secondary | ICD-10-CM | POA: Diagnosis not present

## 2024-06-13 DIAGNOSIS — Z794 Long term (current) use of insulin: Secondary | ICD-10-CM | POA: Diagnosis not present

## 2024-06-13 DIAGNOSIS — M5030 Other cervical disc degeneration, unspecified cervical region: Secondary | ICD-10-CM | POA: Diagnosis not present

## 2024-06-13 DIAGNOSIS — R768 Other specified abnormal immunological findings in serum: Secondary | ICD-10-CM | POA: Diagnosis not present

## 2024-06-13 DIAGNOSIS — E559 Vitamin D deficiency, unspecified: Secondary | ICD-10-CM | POA: Diagnosis not present

## 2024-06-13 DIAGNOSIS — Z9181 History of falling: Secondary | ICD-10-CM | POA: Diagnosis not present

## 2024-06-13 DIAGNOSIS — Z79899 Other long term (current) drug therapy: Secondary | ICD-10-CM | POA: Diagnosis not present

## 2024-06-14 ENCOUNTER — Telehealth: Payer: Self-pay | Admitting: Internal Medicine

## 2024-06-14 NOTE — Telephone Encounter (Signed)
 Patient was ordered a [facility] stress test in March. Has cancelled or no showed at least 3 times. [facility] has asked that it not be rescheduled. How should we proceed?

## 2024-06-17 DIAGNOSIS — M542 Cervicalgia: Secondary | ICD-10-CM | POA: Diagnosis not present

## 2024-06-17 DIAGNOSIS — E1165 Type 2 diabetes mellitus with hyperglycemia: Secondary | ICD-10-CM | POA: Diagnosis not present

## 2024-06-17 DIAGNOSIS — G43009 Migraine without aura, not intractable, without status migrainosus: Secondary | ICD-10-CM | POA: Diagnosis not present

## 2024-06-17 NOTE — Telephone Encounter (Signed)
 Just FYI   Patient has echo on 9/24 in [place].  Follow up visit with Almarie Crate on 10/17.

## 2024-06-19 ENCOUNTER — Other Ambulatory Visit

## 2024-06-20 ENCOUNTER — Other Ambulatory Visit: Payer: Self-pay | Admitting: Licensed Clinical Social Worker

## 2024-06-20 DIAGNOSIS — F4322 Adjustment disorder with anxiety: Secondary | ICD-10-CM

## 2024-06-20 NOTE — Patient Outreach (Signed)
 Complex Care Management   Visit Note  06/20/2024  Name:  Cindy Norris MRN: [id number] DOB: 03-28-1956  Situation: Referral received for Complex Care Management related to Mental/Behavioral Health diagnosis MDD. I obtained verbal consent from Patient.  Visit completed with Patient  on the phone  Background:   Past Medical History:  Diagnosis Date   Asthma    Chronic pain syndrome    Degenerative disc disease, lumbar    Depression    Diabetes mellitus    GERD (gastroesophageal reflux disease)    History of kidney stones    Hyperlipidemia, mixed    Migraines    Narcolepsy    Osteoarthritis    Overweight(278.02)    Obesity   Seizures (HCC)    unknown etiology-on meds-last seizure was several years ago   Unspecified essential hypertension     Assessment: Patient Reported Symptoms:  Cognitive Cognitive Status: Able to follow simple commands, Struggling with memory recall      Neurological Neurological Review of Symptoms: Weakness Neurological Management Strategies: Adequate rest, Routine screening Neurological Self-Management Outcome: 4 (good)  HEENT        Cardiovascular Cardiovascular Symptoms Reported: Fatigue, Other: Other Cardiovascular Symptoms: SOB Does patient have uncontrolled Hypertension?: Yes Cardiovascular Management Strategies: Adequate rest, Medication therapy, Routine screening Cardiovascular Self-Management Outcome: 2 (bad)  Respiratory Respiratory Symptoms Reported: Shortness of breath Respiratory Management Strategies: Adequate rest, Routine screening Respiratory Self-Management Outcome: 2 (bad)  Endocrine      Gastrointestinal        Genitourinary      Integumentary      Musculoskeletal          Psychosocial Psychosocial Symptoms Reported: Sadness - if selected complete PHQ 2-9 Behavioral Management Strategies: Coping strategies, Medication therapy Behavioral Health Self-Management Outcome: 2 (bad) Major Change/Loss/Stressor/Fears (CP):  Medical condition, self Techniques to Cope with Loss/Stress/Change: Diversional activities, Medication Quality of Family Relationships: involved Do you feel physically threatened by others?: No    06/20/2024    PHQ2-9 Depression Screening   Little interest or pleasure in doing things Several days  Feeling down, depressed, or hopeless Several days  PHQ-2 - Total Score 2  Trouble falling or staying asleep, or sleeping too much More than half the days  Feeling tired or having little energy More than half the days  Poor appetite or overeating  Several days  Feeling bad about yourself - or that you are a failure or have let yourself or your family down More than half the days  Trouble concentrating on things, such as reading the newspaper or watching television Several days  Moving or speaking so slowly that other people could have noticed.  Or the opposite - being so fidgety or restless that you have been moving around a lot more than usual More than half the days  Thoughts that you would be better off dead, or hurting yourself in some way Not at all  PHQ2-9 Total Score 12  If you checked off any problems, how difficult have these problems made it for you to do your work, take care of things at home, or get along with other people Somewhat difficult  Depression Interventions/Treatment Referral to Psychiatry, [facility] Provided, Medication    There were no vitals filed for this visit.  Medications Reviewed Today     Reviewed by Merlynn Lyle CROME, LCSW (Social Worker) on 06/20/24 at 1217  Med List Status: <None>   Medication Order Taking? Sig Documenting Provider Last Dose Status Informant  albuterol  (VENTOLIN   HFA) 108 (90 Base) MCG/ACT inhaler [id number]  Inhale 1-2 puffs into the lungs as needed for wheezing or shortness of breath. [provider]  Active Self  atorvastatin  (LIPITOR ) 80 MG tablet [id number]  Take 80 mg by mouth at bedtime. [provider]  Active Self   busPIRone  (BUSPAR ) 10 MG tablet [id number]  Take 10 mg by mouth 2 (two) times daily. [provider]  Active Self  carvedilol  (COREG ) 3.125 MG tablet [id number]  Take 1 tablet (3.125 mg total) by mouth 2 (two) times daily. Parthenia Olivia HERO, PA-C  Expired 05/29/24 2359   ezetimibe  (ZETIA ) 10 MG tablet [id number]  Take 10 mg by mouth daily. [provider]  Active Self  gabapentin  (NEURONTIN ) 800 MG tablet [id number]  Take 800 mg by mouth 3 (three) times daily. [provider]  Active Self  HYDROcodone -acetaminophen  (NORCO) 7.5-325 MG tablet [id number]  Take 1 tablet by mouth every 6 (six) hours as needed for moderate pain. [provider]  Active Self  hydrOXYzine  (ATARAX /VISTARIL ) 25 MG tablet [id number]  Take 25 mg by mouth 2 (two) times daily. [provider]  Active Self  ibuprofen  (ADVIL ,MOTRIN ) 800 MG tablet [id number]  Take 800 mg by mouth every 8 (eight) hours as needed for moderate pain. [provider]  Active Self  LANTUS SOLOSTAR 100 UNIT/ML Solostar Pen [id number]  Inject 34 Units into the skin daily. [provider]  Active   levETIRAcetam  (KEPPRA ) 1000 MG tablet [id number]  Take 1,000 mg by mouth 2 (two) times daily. [provider]  Active Self  lidocaine  (LIDODERM ) 5 % [id number]  Place 1 patch onto the skin daily as needed (pain). Remove & Discard patch within 12 hours or as directed by MD [provider]  Active Self  metFORMIN  (GLUCOPHAGE -XR) 500 MG 24 hr tablet [id number]  Take 500 mg by mouth 2 (two) times daily.  Patient taking differently: Take 1,000 mg by mouth 2 (two) times daily.   [provider]  Active Self  midodrine  (PROAMATINE ) 10 MG tablet [id number]  Take 15 mg by mouth 3 (three) times daily. [provider]  Active Self  modafinil  (PROVIGIL ) 200 MG tablet [id number]  Take 400 mg by mouth every morning.  [provider]  Active Self  Multiple Vitamin (MULTIVITAMIN WITH  MINERALS) TABS tablet [id number]  Take 1 tablet by mouth daily. [provider]  Active Self  nabumetone  (RELAFEN ) 750 MG tablet [id number]  Take 750 mg by mouth 2 (two) times daily. [provider]  Active Self  omeprazole (PRILOSEC) 40 MG capsule [id number]  Take 80 mg by mouth at bedtime. [provider]  Active Self  oxybutynin  (DITROPAN ) 5 MG tablet [id number]  Take 5 mg by mouth 3 (three) times daily. [provider]  Active Self  oxyCODONE  (ROXICODONE ) 5 MG immediate release tablet [id number]  Take 1 tablet (5 mg total) by mouth every 4 (four) hours as needed for severe pain (pain score 7-10). Yolande Lamar BROCKS, MD  Active   Polyethyl Glycol-Propyl Glycol [facility] OP) [id number]  Place 1 drop into both eyes 2 (two) times daily. [provider]  Active Self  SUMAtriptan  (IMITREX ) 50 MG tablet [id number]  Take 50-100 mg by mouth every 2 (two) hours as needed for migraine. May repeat in 2 hours if headache persists or recurs. [provider]  Active Self  topiramate  (TOPAMAX ) 100 MG tablet [id number]  Take 100 mg by mouth 2 (two) times daily. [provider]  Active Self  venlafaxine  XR (EFFEXOR -XR) 150 MG 24 hr capsule [id number]  Take 300 mg by mouth daily. [provider]  Active Self            Recommendation:   PCP Follow-up Continue Current Plan of Care  Follow Up Plan:   Telephone follow-up in 1 month  Lyle Rung, BSW, MSW, LCSW Licensed Clinical Social Worker [employer]   [facility] [email or web address] Direct Dial: [phone]

## 2024-06-24 NOTE — Patient Instructions (Signed)
 Visit Information  Thank you for taking time to visit with me today. Please don't hesitate to contact me if I can be of assistance to you before our next scheduled appointment.  Your next care management appointment is by telephone on 07/04/24 at 3pm  Referral to RN Case Manager.  Please call the care guide team at [phone] if you need to cancel, schedule, or reschedule an appointment.   Hang in there!!! Remember 988  Please call the Suicide and Crisis Lifeline: 988 call the [place]  National Suicide Prevention Lifeline: 305-466-0199 or TTY: (616) 813-0708 TTY 724-003-4285) to talk to a trained counselor call 1-800-273-TALK (toll free, 24 hour hotline) go to [facility] [address] [place] [phone]) call the [facility] Line: [phone] if you are experiencing a Mental Health or Behavioral Health Crisis or need someone to talk to.  Cindy Norris, BSW, MSW, LCSW Licensed Clinical Social Worker [employer]   [facility] [email or web address] Direct Dial: [phone]

## 2024-06-27 ENCOUNTER — Encounter: Payer: Self-pay | Admitting: *Deleted

## 2024-06-27 ENCOUNTER — Other Ambulatory Visit: Payer: Self-pay

## 2024-06-27 ENCOUNTER — Other Ambulatory Visit: Payer: Self-pay | Admitting: *Deleted

## 2024-06-27 NOTE — Patient Instructions (Signed)
 Visit Information  Thank you for taking time to visit with me today. Please don't hesitate to contact me if I can be of assistance to you before our next scheduled appointment.  Our next appointment is by telephone on 07/11/24 at 11:00 am. Please call the care guide team at [phone] if you need to cancel or reschedule your appointment.   Following is a copy of your care plan:   Goals Addressed             This Visit's Progress    VBCI RN Care Plan       Problems:  Chronic Disease Management support and education needs related to DMII  Goal: Over the next 6 months the Patient will demonstrate Improved adherence to prescribed treatment plan for DMII as evidenced by an A1C of less than 7.0% Over the next 14 days, patient will review dietary recommendations and discuss with RN Care Manager  Over the next 30 days, patient will start to implement dietary changes  Interventions:   Diabetes Interventions: Assessed patient's understanding of A1c goal: <7% Provided education to patient about basic DM disease process Reviewed medications with patient and discussed importance of medication adherence Counseled on importance of regular laboratory monitoring as prescribed Discussed plans with patient for ongoing care management follow up and provided patient with direct contact information for care management team Provided patient with written educational materials related to hypo and hyperglycemia and importance of correct treatment Advised patient, providing education and rationale, to check cbg twice daily and as needed and record, calling PCP for findings outside established parameters Review of patient status, including review of consultants reports, relevant laboratory and other test results, and medications completed Assessed social determinant of health barriers Provided written educational material on diet Discussed current diet and recommendations 3 meals per day with 30 grams of  carbohydrates and up to 2 snacks per day with less than 15 grams of carbohydrates as needed to correct low blood sugar Encouraged to follow-up with PCP every 3 months or as recommended Discussed long term complications of hyperglycemia and complications of hypoglycemia Discussed recent home blood sugar readings Improved since PCP increased Lantus to 40 units daily Lab Results  Component Value Date   HGBA1C 7.7 06/10/2024    Patient Self-Care Activities:  Attend all scheduled provider appointments Call provider office for new concerns or questions  Take medications as prescribed   schedule appointment with eye doctor take the blood sugar log to all doctor visits manage portion size  Plan:  Telephone follow up appointment with care management team member scheduled for:  07/11/24             Please call the [facility]: [phone] call [phone] if you are experiencing a Mental Health or Behavioral Health Crisis or need someone to talk to.  The patient verbalized understanding of instructions, educational materials, and care plan provided today and agreed to receive a mailed copy of patient instructions, educational materials, and care plan.   Josette Pellet, RN, BSN [place]  [facility] RN Care Manager Direct Dial: [phone]  Fax: [phone]

## 2024-06-27 NOTE — Patient Outreach (Signed)
 Complex Care Management   Visit Note  06/27/2024  Name:  Cindy Norris MRN: [id number] DOB: 04-11-1956  Situation: Referral received for Complex Care Management related to diabetes and depression. I obtained verbal consent from Patient.  Visit completed with Patient  on the phone  Background:   Past Medical History:  Diagnosis Date   Asthma    Chronic pain syndrome    Degenerative disc disease, lumbar    Depression    Diabetes mellitus    GERD (gastroesophageal reflux disease)    History of kidney stones    Hyperlipidemia, mixed    Migraines    Narcolepsy    Osteoarthritis    Overweight(278.02)    Obesity   Seizures (HCC)    unknown etiology-on meds-last seizure was several years ago   Unspecified essential hypertension     Assessment: Patient Reported Symptoms:  Cognitive Cognitive Status: Alert and oriented to person, place, and time, Normal speech and language skills, No symptoms reported, Struggling with memory recall Cognitive/Intellectual Conditions Management [RPT]: None reported or documented in medical history or problem list   Health Maintenance Behaviors: Annual physical exam Healing Pattern: Unsure Health Facilitated by: Rest  Neurological Neurological Review of Symptoms: No symptoms reported    HEENT HEENT Symptoms Reported: No symptoms reported      Cardiovascular Cardiovascular Symptoms Reported: Fatigue, Other:, Lightheadness Other Cardiovascular Symptoms: SOB with exertion Does patient have uncontrolled Hypertension?: No Cardiovascular Management Strategies: Adequate rest, Routine screening, Medication therapy Weight: 213 lb (96.6 kg) Cardiovascular Self-Management Outcome: 2 (bad) Cardiovascular Comment: Following up with cardiologist on 07/12/24 for orthostatic hypotension and faitgue and is scheduled for an echocardiogram on 07/09/24. Cancelled cardiac rehab. Encouraged to reconsider. Taking midodrine  for hypotension. No meds for hypertension.   Respiratory Respiratory Symptoms Reported: Shortness of breath Additional Respiratory Details: mainly with exertion. Followed by cardiology. Respiratory Management Strategies: Adequate rest, Routine screening Respiratory Self-Management Outcome: 3 (uncertain)  Endocrine Endocrine Symptoms Reported: Increased thirst, Increased urination Is patient diabetic?: Yes Is patient checking blood sugars at home?: Yes List most recent blood sugar readings, include date and time of day: Checks morning and night. 06/27/24 10:22 am: 177. In he low 200s last night. Lowest over the past 2 weeks was in the 70s. Endocrine Self-Management Outcome: 3 (uncertain) Endocrine Comment: Feels shaky if blood sugar drops 100 and she'll correct with a peanut butter sandwich or drink with sugar. Fasting blood sugar was in the 200s prior to increasing Lantus from 34 units to 40 units daily. Discussed long term complications of hyperglycemia and risks associated with hypoglycemia. Follow-up with PCP as planned and sooner if necessary.  Gastrointestinal Gastrointestinal Symptoms Reported: Unintentional weight gain, Other Other Gastrointestinal Symptoms: Gets strangled/choked on solid food at times. Plans to follow-up with gastroenterologist and discuss if her esophagus needs to be stretched. Additional Gastrointestinal Details: reports losing 90 lbs in 3 months while on ozempic. Once that was discontinued she has regained about 50 lbs. Gastrointestinal Management Strategies: Diet modification Gastrointestinal Self-Management Outcome: 4 (good)    Genitourinary Genitourinary Symptoms Reported: No symptoms reported    Integumentary Integumentary Symptoms Reported: No symptoms reported    Musculoskeletal Musculoskelatal Symptoms Reviewed: Back pain, Joint pain, Weakness (knee pain) Additional Musculoskeletal Details: uses cane for ambulation and handrails on stairs Musculoskeletal Management Strategies: Adequate rest, Medication  therapy Musculoskeletal Self-Management Outcome: 3 (uncertain) Musculoskeletal Comment: Taking hydrocodone  in the morning and before bed and ibuprofen  800mg  BID PRN for pain relief Falls in the past year?: No Patient at  Risk for Falls Due to: Impaired balance/gait, Medication side effect, History of fall(s) Fall risk Follow up: Falls evaluation completed  Psychosocial Psychosocial Symptoms Reported: Sadness - if selected complete PHQ 2-9, Depression - if selected complete PHQ 2-9 Additional Psychological Details: Taking medication as directed. Working with Lyle Rung, LCSW for counseling. Encouraged to talk with family/friends and to keep social connections and support systems intact. Behavioral Management Strategies: Medication therapy, Coping strategies Behavioral Health Self-Management Outcome: 3 (uncertain) Major Change/Loss/Stressor/Fears (CP): Medical condition, self, Relationship concerns (Strained relationship with daughter. She was tearful during discussion. Encouraged to talk more with LCSW at next visit on 07/04/24. She does have support from her grandson and son and she attends church regularly.) Techniques to [employer] with Loss/Stress/Change: Medication, Diversional activities, Counseling Quality of Family Relationships: involved, helpful, supportive Do you feel physically threatened by others?: No    06/27/2024    PHQ2-9 Depression Screening   Little interest or pleasure in doing things    Feeling down, depressed, or hopeless    PHQ-2 - Total Score    Trouble falling or staying asleep, or sleeping too much    Feeling tired or having little energy    Poor appetite or overeating     Feeling bad about yourself - or that you are a failure or have let yourself or your family down    Trouble concentrating on things, such as reading the newspaper or watching television    Moving or speaking so slowly that other people could have noticed.  Or the opposite - being so fidgety or restless  that you have been moving around a lot more than usual    Thoughts that you would be better off dead, or hurting yourself in some way    PHQ2-9 Total Score    If you checked off any problems, how difficult have these problems made it for you to do your work, take care of things at home, or get along with other people    Depression Interventions/Treatment      Vitals:   06/27/24 1039  BP: 120/72    Medications Reviewed Today     Reviewed by Cindy Josette SAILOR, RN (Registered Nurse) on 06/27/24 at 1132  Med List Status: <None>   Medication Order Taking? Sig Documenting Provider Last Dose Status Informant  albuterol  (VENTOLIN  HFA) 108 (90 Base) MCG/ACT inhaler [id number] Yes Inhale 1-2 puffs into the lungs as needed for wheezing or shortness of breath. [provider]  Active Self  atorvastatin  (LIPITOR ) 80 MG tablet [id number] Yes Take 80 mg by mouth at bedtime. [provider]  Active Self  busPIRone  (BUSPAR ) 10 MG tablet [id number] Yes Take 10 mg by mouth 2 (two) times daily. [provider]  Active Self  carvedilol  (COREG ) 3.125 MG tablet [id number]  Take 1 tablet (3.125 mg total) by mouth 2 (two) times daily.  Patient not taking: Reported on 06/27/2024   Parthenia Olivia HERO, PA-C  Expired 05/29/24 2359   ezetimibe  (ZETIA ) 10 MG tablet [id number] Yes Take 10 mg by mouth daily. [provider]  Active Self  gabapentin  (NEURONTIN ) 800 MG tablet [id number] Yes Take 800 mg by mouth 3 (three) times daily. [provider]  Active Self  HYDROcodone -acetaminophen  (NORCO) 7.5-325 MG tablet [id number] Yes Take 1 tablet by mouth every 6 (six) hours as needed for moderate pain. [provider]  Active Self  hydrOXYzine  (ATARAX /VISTARIL ) 25 MG tablet [id number] Yes Take 25 mg by mouth 2 (two) times daily. [provider]  Active Self  ibuprofen  (ADVIL ,MOTRIN ) 800 MG tablet [id number] Yes Take 800 mg by mouth every 8 (eight) hours as needed for moderate  pain. [provider]  Active Self  LANTUS SOLOSTAR 100 UNIT/ML Solostar Pen [id number] Yes Inject 40 Units into the skin daily. [provider]  Active   levETIRAcetam  (KEPPRA ) 1000 MG tablet [id number] Yes Take 1,000 mg by mouth 2 (two) times daily. [provider]  Active Self  lidocaine  (LIDODERM ) 5 % [id number] Yes Place 1 patch onto the skin daily as needed (pain). Remove & Discard patch within 12 hours or as directed by MD [provider]  Active Self  metFORMIN  (GLUCOPHAGE -XR) 500 MG 24 hr tablet [id number]  Take 500 mg by mouth 2 (two) times daily.  Patient not taking: Reported on 06/27/2024   [provider]  Active Self  midodrine  (PROAMATINE ) 10 MG tablet [id number] Yes Take 15 mg by mouth 3 (three) times daily. [provider]  Active Self  modafinil  (PROVIGIL ) 200 MG tablet [id number] Yes Take 400 mg by mouth every morning.  [provider]  Active Self  Multiple Vitamin (MULTIVITAMIN WITH MINERALS) TABS tablet [id number] Yes Take 1 tablet by mouth daily. [provider]  Active Self  nabumetone  (RELAFEN ) 750 MG tablet [id number]  Take 750 mg by mouth 2 (two) times daily.  Patient not taking: Reported on 06/27/2024   [provider]  Active Self  omeprazole (PRILOSEC) 40 MG capsule [id number]  Take 80 mg by mouth at bedtime. [provider]  Active Self  oxybutynin  (DITROPAN ) 5 MG tablet [id number]  Take 5 mg by mouth 3 (three) times daily. [provider]  Active Self  oxyCODONE  (ROXICODONE ) 5 MG immediate release tablet [id number]  Take 1 tablet (5 mg total) by mouth every 4 (four) hours as needed for severe pain (pain score 7-10).  Patient not taking: Reported on 06/27/2024   Yolande Lamar BROCKS, MD  Active   Polyethyl Glycol-Propyl Glycol [facility] OP) [id number]  Place 1 drop into both eyes 2 (two) times daily. [provider]  Active Self  SUMAtriptan  (IMITREX ) 50 MG tablet [id number] Yes  Take 50-100 mg by mouth every 2 (two) hours as needed for migraine. May repeat in 2 hours if headache persists or recurs. [provider]  Active Self  topiramate  (TOPAMAX ) 100 MG tablet [id number] Yes Take 100 mg by mouth 2 (two) times daily. [provider]  Active Self  venlafaxine  XR (EFFEXOR -XR) 150 MG 24 hr capsule [id number] Yes Take 300 mg by mouth daily. [provider]  Active Self            Recommendation:   PCP Follow-up Continue Current Plan of Care Telephone follow-up with LCSW on 07/04/24  Follow Up Plan:   Telephone follow up appointment date/time:  07/11/24 at 11:00  Josette Pellet, RN, BSN [place]  [facility] RN Care Manager Direct Dial: [phone]  Fax: [phone]

## 2024-07-04 ENCOUNTER — Ambulatory Visit: Admitting: Nurse Practitioner

## 2024-07-04 ENCOUNTER — Telehealth: Admitting: Licensed Clinical Social Worker

## 2024-07-09 ENCOUNTER — Ambulatory Visit: Attending: Nurse Practitioner

## 2024-07-09 DIAGNOSIS — R0609 Other forms of dyspnea: Secondary | ICD-10-CM | POA: Diagnosis not present

## 2024-07-09 LAB — ECHOCARDIOGRAM COMPLETE
AR max vel: 1.7 cm2
AV Peak grad: 7.1 mmHg
Ao pk vel: 1.33 m/s
Area-P 1/2: 3.83 cm2
Calc EF: 50.5 %
S' Lateral: 3.7 cm
Single Plane A2C EF: 48.8 %
Single Plane A4C EF: 54.2 %

## 2024-07-11 ENCOUNTER — Ambulatory Visit: Payer: Self-pay | Admitting: Nurse Practitioner

## 2024-07-11 ENCOUNTER — Encounter: Payer: Self-pay | Admitting: *Deleted

## 2024-07-11 ENCOUNTER — Other Ambulatory Visit: Payer: Self-pay | Admitting: *Deleted

## 2024-07-11 NOTE — Patient Instructions (Signed)
 Niels KANDICE Dawn - I am sorry I was unable to reach you today for our scheduled appointment. I work with [employer], PA-C and am calling to support your healthcare needs. I will call you on Wednesday, 07/17/24 around 1:00 pm. If you need to reach out to me sooner, please give me a call at [phone]. I look forward to speaking with you soon.   Thank you,   Josette Pellet, RN, BSN [place]  [facility] RN Care Manager Direct Dial: [phone]  Fax: [phone]

## 2024-07-12 ENCOUNTER — Encounter: Payer: Self-pay | Admitting: Nurse Practitioner

## 2024-07-12 ENCOUNTER — Ambulatory Visit: Attending: Nurse Practitioner | Admitting: Nurse Practitioner

## 2024-07-12 VITALS — BP 134/76 | HR 84 | Ht 66.0 in | Wt 214.8 lb

## 2024-07-12 DIAGNOSIS — R0609 Other forms of dyspnea: Secondary | ICD-10-CM | POA: Diagnosis not present

## 2024-07-12 DIAGNOSIS — R5382 Chronic fatigue, unspecified: Secondary | ICD-10-CM

## 2024-07-12 DIAGNOSIS — I951 Orthostatic hypotension: Secondary | ICD-10-CM

## 2024-07-12 DIAGNOSIS — R079 Chest pain, unspecified: Secondary | ICD-10-CM | POA: Diagnosis not present

## 2024-07-12 MED ORDER — CARVEDILOL 3.125 MG PO TABS: 3.1250 mg | ORAL_TABLET | Freq: Two times a day (BID) | ORAL | 3 refills | Status: DC

## 2024-07-12 NOTE — Patient Instructions (Addendum)

## 2024-07-12 NOTE — Progress Notes (Unsigned)
 Cardiology Office Note   Date:  05/29/2024 ID:  MICHON KACZMAREK, DOB Dec 25, 1955, MRN [id number] PCP: Dow Longs, PA-C  [facility] Providers Cardiologist:  Diannah SHAUNNA Maywood, MD     History of Present Illness Cindy Norris is a 68 y.o. female with a PMH of chest pain, palpitations, orthostatic hypotension, HLD, narcolepsy, and OSA (not on CPAP), who presents today for shortness of breath, poor balance, and fatigue follow-up.  Last seen by Olivia Pavy, PA-C on December 12, 2023. Pt noted intermittent chest pressure, noted with exertion at DOE at the time. Was started on low dose Coreg  for chest pain / palpitations. Stress test was ordered but not completed at the time d/t transportation issues.   She contacted our office noting feeling off balance as well as fatigue.  Patient also noted some shortness of breath, worsening within the past month and noticed at rest and with exertion.  Was recommended she follow-up in the office for further evaluation of her symptoms.  Today she presents for office evaluation.  She admits to several concerns.  She mitts to dyspnea on exertion, as well as mild left-sided chest pressure and left sided pain along left upper quadrant.  Her chest pressure is noted if she typically walks around too much or noticed when cleaning her house, denies any alleviating factors.  She has not tried any medication, sensation has been stable over time.  She admits to fatigue and has known diagnosis of narcolepsy for many years, says she typically sleeps during the daytime.  Denies any alcohol  or illicit drug use.  Does smoke marijuana every once in a while per her report. Denies any palpitations, syncope, presyncope, orthopnea, PND, swelling or significant weight changes, acute bleeding, or claudication.    ROS: Negative. See HPI.   Studies Reviewed  EKG:      Echo 13-May-2021:  1. Left ventricular ejection fraction, by estimation, is 55 to 60%. The  left ventricle has  normal function. The left ventricle has no regional  wall motion abnormalities. Left ventricular diastolic parameters are  consistent with Grade I diastolic  dysfunction (impaired relaxation). Elevated left atrial pressure.   2. Right ventricular systolic function is normal. The right ventricular  size is normal. Tricuspid regurgitation signal is inadequate for assessing  PA pressure.   3. The mitral valve is normal in structure. No evidence of mitral valve  regurgitation. No evidence of mitral stenosis.   4. The aortic valve is tricuspid. Aortic valve regurgitation is not  visualized. No aortic stenosis is present.   5. The inferior vena cava is normal in size with greater than 50%  respiratory variability, suggesting right atrial pressure of 3 mmHg.  Cardiac monitor 02/2021:  ZIO XT reviewed. 13 days, 17 hours analyzed. Predominant rhythm is sinus with heart rate ranging from 58 bpm up to 152 bpm and average heart rate 95 bpm. There were rare PACs including couplets and triplets representing less than 1% total beats. There were rare PVCs including couplets representing less than 1% total beats. Three episodes of SVT were noted, the longest of which lasted for approximately 12 seconds with average heart rate 148 bpm. No pauses.  Lexiscan  02/2021:  There was no ST segment deviation noted during stress. The study is normal. There are no perfusion defects consistent with prior infarct or current ischemia. This is a low risk study. The left ventricular ejection fraction is low normal (50%).  Risk Assessment/Calculations     The 10-year ASCVD risk  score (Arnett DK, et al., 2019) is: 12.3%   Values used to calculate the score:     Age: 7 years     Clincally relevant sex: Female     Is Non-Hispanic African American: No     Diabetic: Yes     Tobacco smoker: No     Systolic Blood Pressure: 120 mmHg     Is BP treated: No     HDL Cholesterol: 69 mg/dL     Total Cholesterol: 155  mg/dL  Physical Exam VS:  There were no vitals taken for this visit.       Wt Readings from Last 3 Encounters:  06/27/24 213 lb (96.6 kg)  05/29/24 204 lb (92.5 kg)  02/24/24 185 lb 3 oz (84 kg)    GEN: Well nourished, well developed in no acute distress, appears fatigued NECK: No JVD; No carotid bruits CARDIAC: S1/S2, RRR, no murmurs, rubs, gallops RESPIRATORY:  Clear to auscultation without rales, wheezing or rhonchi  ABDOMEN: Soft, non-tender, non-distended EXTREMITIES:  No edema; No deformity   ASSESSMENT AND PLAN  Chest pain of uncertain etiology, DOE Etiology unclear and multifactorial. EKG today is without acute ischemic changes. NST in 2022 was normal. She has had issues with arrange NST in the past d/t transpiration issues. Will arrange.  Did discuss risk versus benefits of Lexi scan, and she verbalized understanding is agreeable to proceed. Will also arrange Echo for evaluation of DOE.  No medication changes at this time. Heart healthy diet and regular cardiovascular exercise encouraged.  Care and ED precautions discussed. Discussed to avoid marijuana and risks associated with that.     Informed Consent   Shared Decision Making/Informed Consent The risks [chest pain, shortness of breath, cardiac arrhythmias, dizziness, blood pressure fluctuations, myocardial infarction, stroke/transient ischemic attack, nausea, vomiting, allergic reaction, radiation exposure, metallic taste sensation and life-threatening complications (estimated to be 1 in 10,000)], benefits (risk stratification, diagnosing coronary artery disease, treatment guidance) and alternatives of a nuclear stress test were discussed in detail with Ms. Dasie and she agrees to proceed.      2. Orthostatic hypotension Orthostatics today were non-diagnostic, did feel dizzy/wobbly with position changes. Continue midodrine . Care and ED precautions discussed.   3. Fatigue Etiology multifactorial. Known hx of narcolepsy.  Does not see a specialist for this and instructed her to speak to PCP regarding this. Will update Echo as noted above.   4. Abdominal pain Note to be LUQ. Recommended to f/u with PCP regarding this. Care and ED precautions discussed.    Dispo: Follow-up with MD/APP in 6-8 weeks or sooner if anything changes.   Signed, Almarie Crate, NP

## 2024-07-17 ENCOUNTER — Encounter: Payer: Self-pay | Admitting: *Deleted

## 2024-07-17 ENCOUNTER — Other Ambulatory Visit: Payer: Self-pay

## 2024-07-17 ENCOUNTER — Other Ambulatory Visit: Payer: Self-pay | Admitting: *Deleted

## 2024-07-24 DIAGNOSIS — R7689 Other specified abnormal immunological findings in serum: Secondary | ICD-10-CM | POA: Diagnosis not present

## 2024-07-24 DIAGNOSIS — E559 Vitamin D deficiency, unspecified: Secondary | ICD-10-CM | POA: Diagnosis not present

## 2024-07-24 DIAGNOSIS — Z79899 Other long term (current) drug therapy: Secondary | ICD-10-CM | POA: Diagnosis not present

## 2024-07-24 DIAGNOSIS — M5030 Other cervical disc degeneration, unspecified cervical region: Secondary | ICD-10-CM | POA: Diagnosis not present

## 2024-07-24 DIAGNOSIS — E1142 Type 2 diabetes mellitus with diabetic polyneuropathy: Secondary | ICD-10-CM | POA: Diagnosis not present

## 2024-07-24 DIAGNOSIS — Z9181 History of falling: Secondary | ICD-10-CM | POA: Diagnosis not present

## 2024-07-24 DIAGNOSIS — Z794 Long term (current) use of insulin: Secondary | ICD-10-CM | POA: Diagnosis not present

## 2024-07-26 DIAGNOSIS — Z79899 Other long term (current) drug therapy: Secondary | ICD-10-CM | POA: Diagnosis not present

## 2024-08-08 ENCOUNTER — Encounter: Payer: Self-pay | Admitting: *Deleted

## 2024-08-08 ENCOUNTER — Telehealth: Payer: Self-pay | Admitting: *Deleted

## 2024-08-08 NOTE — Patient Instructions (Signed)
 Niels KANDICE Dawn - I am sorry I was unable to reach you today for our scheduled appointment. I work with Dow Longs, PA-C and am calling to support your healthcare needs. I have scheduled another telephone call for Friday, 08/09/24 at 11:00. I look forward to speaking with you soon.   Thank you,   Josette Pellet, RN, BSN [place]  [facility] RN Care Manager Direct Dial: [phone]  Fax: [phone]

## 2024-08-09 ENCOUNTER — Telehealth: Payer: Self-pay

## 2024-08-09 ENCOUNTER — Telehealth: Payer: Self-pay | Admitting: *Deleted

## 2024-08-09 NOTE — Progress Notes (Signed)
 Complex Care Management Care Guide Note  08/09/2024 Name: Cindy Norris MRN: [id number] DOB: August 03, 1956  Cindy Norris is a 68 y.o. year old female who is a primary care patient of Dow Longs, DEVONNA and is actively engaged with the care management team. I reached out to Cindy Norris by phone today to assist with re-scheduling  with the RN Case Manager.  Follow up plan: Telephone appointment with complex care management team member scheduled for:  08/13/24 at 10:00 a.m.   Cindy [facility]  [facility] VBCI Assistant Direct Dial: [phone]  Fax: [phone]

## 2024-08-13 ENCOUNTER — Encounter: Payer: Self-pay | Admitting: *Deleted

## 2024-08-13 ENCOUNTER — Telehealth: Payer: Self-pay | Admitting: *Deleted

## 2024-08-13 NOTE — Patient Instructions (Signed)
 Niels KANDICE Dawn - I have attempted to call you three times but have been unsuccessful in reaching you. I work with Dow Longs, PA-C and am calling to support your healthcare needs. If I can be of assistance to you, please contact me at [phone].     Thank you,   Josette Pellet, RN, BSN [place]  [facility] RN Care Manager Direct Dial: [phone]  Fax: [phone]

## 2024-08-20 ENCOUNTER — Other Ambulatory Visit: Payer: Self-pay | Admitting: Nurse Practitioner

## 2024-09-02 ENCOUNTER — Other Ambulatory Visit: Payer: Self-pay

## 2024-09-06 ENCOUNTER — Other Ambulatory Visit: Payer: Self-pay | Admitting: *Deleted

## 2024-09-06 MED ORDER — CARVEDILOL 3.125 MG PO TABS: 3.1250 mg | ORAL_TABLET | Freq: Two times a day (BID) | ORAL | 3 refills | Status: AC

## 2024-09-09 NOTE — Telephone Encounter (Signed)
 Left voicemail about patient's stress test on 12/27/23.

## 2024-09-22 ENCOUNTER — Encounter (HOSPITAL_COMMUNITY): Payer: Self-pay

## 2024-09-23 NOTE — ED Provider Notes (Signed)
 ED Progress Note   Patient was seen and examined today during my daily  rounds.  Patient remains medically stable for transfer. Patient is still waiting for a bed at outside hospital. Patient's care discussed with nursing staff.  Events of the past 24 hours include: No issues reported.  Patient was throughout my shift.  Physical examination:  BP 141/64   Pulse 80   Temp 36.8 C (98.2 F) (Oral)   Resp 20   Ht 165.1 cm (5' 5)   Wt 69.5 kg (153 lb 3.2 oz)   LMP  (LMP Unknown)   SpO2 97%   BMI 25.49 kg/m   General: No cardiopulmonary distress  Medical Decision Making:  Assessment and Plan:  Briefly, Cindy Norris is a 68 y.o. female  who presents with renal colic.  Patient is stable for transfer.  Awaiting transfer.   Diagnosis:  1. Hyperglycemia due to diabetes mellitus (CMS-HCC)   2. Calculus of ureter    Condition: Stable Disposition: Transfer  This chart has been completed using [employer] (consulting) software, and while attempts have been made to ensure accuracy, certain words and phrases may not be transcribed as intended.

## 2024-09-23 NOTE — Nursing Note (Signed)
Breakfast tray given and setup

## 2024-09-24 ENCOUNTER — Observation Stay (HOSPITAL_COMMUNITY)
Admit: 2024-09-24 | Discharge: 2024-09-26 | Disposition: A | Discharge status: Home / Self Care | Source: Other Acute Inpatient Hospital | Attending: Family Medicine | Admitting: Family Medicine

## 2024-09-24 ENCOUNTER — Telehealth: Payer: Self-pay | Admitting: Urology

## 2024-09-24 ENCOUNTER — Other Ambulatory Visit: Payer: Self-pay

## 2024-09-24 DIAGNOSIS — F419 Anxiety disorder, unspecified: Secondary | ICD-10-CM | POA: Diagnosis not present

## 2024-09-24 DIAGNOSIS — N132 Hydronephrosis with renal and ureteral calculous obstruction: Secondary | ICD-10-CM | POA: Diagnosis not present

## 2024-09-24 DIAGNOSIS — G43009 Migraine without aura, not intractable, without status migrainosus: Secondary | ICD-10-CM | POA: Insufficient documentation

## 2024-09-24 DIAGNOSIS — M797 Fibromyalgia: Secondary | ICD-10-CM | POA: Insufficient documentation

## 2024-09-24 DIAGNOSIS — N135 Crossing vessel and stricture of ureter without hydronephrosis: Secondary | ICD-10-CM | POA: Diagnosis not present

## 2024-09-24 DIAGNOSIS — Z7984 Long term (current) use of oral hypoglycemic drugs: Secondary | ICD-10-CM | POA: Diagnosis not present

## 2024-09-24 DIAGNOSIS — Z87891 Personal history of nicotine dependence: Secondary | ICD-10-CM | POA: Diagnosis not present

## 2024-09-24 DIAGNOSIS — E119 Type 2 diabetes mellitus without complications: Secondary | ICD-10-CM

## 2024-09-24 DIAGNOSIS — J45909 Unspecified asthma, uncomplicated: Secondary | ICD-10-CM | POA: Insufficient documentation

## 2024-09-24 DIAGNOSIS — R569 Unspecified convulsions: Secondary | ICD-10-CM

## 2024-09-24 DIAGNOSIS — K219 Gastro-esophageal reflux disease without esophagitis: Secondary | ICD-10-CM | POA: Diagnosis not present

## 2024-09-24 DIAGNOSIS — Z96653 Presence of artificial knee joint, bilateral: Secondary | ICD-10-CM | POA: Diagnosis not present

## 2024-09-24 DIAGNOSIS — Z794 Long term (current) use of insulin: Secondary | ICD-10-CM | POA: Insufficient documentation

## 2024-09-24 DIAGNOSIS — F32A Depression, unspecified: Secondary | ICD-10-CM | POA: Diagnosis not present

## 2024-09-24 DIAGNOSIS — G4733 Obstructive sleep apnea (adult) (pediatric): Secondary | ICD-10-CM | POA: Diagnosis not present

## 2024-09-24 DIAGNOSIS — Z01812 Encounter for preprocedural laboratory examination: Secondary | ICD-10-CM | POA: Diagnosis not present

## 2024-09-24 DIAGNOSIS — N201 Calculus of ureter: Secondary | ICD-10-CM

## 2024-09-24 DIAGNOSIS — E785 Hyperlipidemia, unspecified: Secondary | ICD-10-CM | POA: Diagnosis not present

## 2024-09-24 DIAGNOSIS — Z79899 Other long term (current) drug therapy: Secondary | ICD-10-CM | POA: Insufficient documentation

## 2024-09-24 DIAGNOSIS — E1165 Type 2 diabetes mellitus with hyperglycemia: Secondary | ICD-10-CM | POA: Diagnosis not present

## 2024-09-24 DIAGNOSIS — I10 Essential (primary) hypertension: Secondary | ICD-10-CM | POA: Insufficient documentation

## 2024-09-24 DIAGNOSIS — G40909 Epilepsy, unspecified, not intractable, without status epilepticus: Secondary | ICD-10-CM | POA: Insufficient documentation

## 2024-09-24 LAB — GLUCOSE, CAPILLARY: Glucose-Capillary: 105 mg/dL — ABNORMAL HIGH (ref 70–99)

## 2024-09-24 MED ORDER — TOPIRAMATE 100 MG PO TABS
100.0000 mg | ORAL_TABLET | Freq: Two times a day (BID) | ORAL | Status: DC
  Administered 2024-09-24 – 2024-09-26 (×3): 100 mg via ORAL
  Filled 2024-09-24 (×4): qty 1

## 2024-09-24 MED ORDER — ALBUTEROL SULFATE HFA 108 (90 BASE) MCG/ACT IN AERS: 1.0000 | INHALATION_SPRAY | RESPIRATORY_TRACT | Status: DC | PRN

## 2024-09-24 MED ORDER — INSULIN ASPART 100 UNIT/ML IJ SOLN
0.0000 [IU] | Freq: Three times a day (TID) | INTRAMUSCULAR | Status: DC
  Administered 2024-09-25 (×3): 2 [IU] via SUBCUTANEOUS
  Administered 2024-09-26: 3 [IU] via SUBCUTANEOUS
  Filled 2024-09-24 (×3): qty 2
  Filled 2024-09-24: qty 3

## 2024-09-24 MED ORDER — HYDROXYZINE HCL 25 MG PO TABS
25.0000 mg | ORAL_TABLET | Freq: Two times a day (BID) | ORAL | Status: DC
  Administered 2024-09-24 – 2024-09-26 (×3): 25 mg via ORAL
  Filled 2024-09-24 (×4): qty 1

## 2024-09-24 MED ORDER — ARTIFICIAL TEARS OPHTHALMIC OINT
TOPICAL_OINTMENT | Freq: Two times a day (BID) | OPHTHALMIC | Status: DC
  Filled 2024-09-24: qty 3.5

## 2024-09-24 MED ORDER — MIDODRINE HCL 5 MG PO TABS
10.0000 mg | ORAL_TABLET | Freq: Two times a day (BID) | ORAL | Status: DC
  Administered 2024-09-24: 10 mg via ORAL
  Filled 2024-09-24 (×2): qty 2

## 2024-09-24 MED ORDER — ATORVASTATIN CALCIUM 40 MG PO TABS
80.0000 mg | ORAL_TABLET | Freq: Every day | ORAL | Status: DC
  Administered 2024-09-24 – 2024-09-25 (×2): 80 mg via ORAL
  Filled 2024-09-24 (×2): qty 2

## 2024-09-24 MED ORDER — ACETAMINOPHEN 650 MG RE SUPP: 650.0000 mg | Freq: Four times a day (QID) | RECTAL | Status: DC | PRN

## 2024-09-24 MED ORDER — ONDANSETRON HCL 4 MG PO TABS: 4.0000 mg | ORAL_TABLET | Freq: Four times a day (QID) | ORAL | Status: DC | PRN

## 2024-09-24 MED ORDER — CARVEDILOL 3.125 MG PO TABS
3.1250 mg | ORAL_TABLET | Freq: Two times a day (BID) | ORAL | Status: DC
  Administered 2024-09-24 – 2024-09-26 (×4): 3.125 mg via ORAL
  Filled 2024-09-24 (×5): qty 1

## 2024-09-24 MED ORDER — VENLAFAXINE HCL ER 150 MG PO CP24
300.0000 mg | ORAL_CAPSULE | Freq: Every day | ORAL | Status: DC
  Administered 2024-09-26: 300 mg via ORAL
  Filled 2024-09-24 (×2): qty 2

## 2024-09-24 MED ORDER — ACETAMINOPHEN 325 MG PO TABS: 650.0000 mg | ORAL_TABLET | Freq: Four times a day (QID) | ORAL | Status: DC | PRN

## 2024-09-24 MED ORDER — GABAPENTIN 400 MG PO CAPS
800.0000 mg | ORAL_CAPSULE | Freq: Three times a day (TID) | ORAL | Status: DC
  Administered 2024-09-24 – 2024-09-26 (×4): 800 mg via ORAL
  Filled 2024-09-24 (×5): qty 2

## 2024-09-24 MED ORDER — LEVETIRACETAM 500 MG PO TABS
1000.0000 mg | ORAL_TABLET | Freq: Two times a day (BID) | ORAL | Status: DC
  Administered 2024-09-24 – 2024-09-26 (×3): 1000 mg via ORAL
  Filled 2024-09-24 (×4): qty 2

## 2024-09-24 MED ORDER — ADULT MULTIVITAMIN W/MINERALS CH
1.0000 | ORAL_TABLET | Freq: Every day | ORAL | Status: DC
  Administered 2024-09-26: 1 via ORAL
  Filled 2024-09-24 (×2): qty 1

## 2024-09-24 MED ORDER — ONDANSETRON HCL 4 MG/2ML IJ SOLN
4.0000 mg | Freq: Four times a day (QID) | INTRAMUSCULAR | Status: DC | PRN
  Administered 2024-09-25: 4 mg via INTRAVENOUS

## 2024-09-24 MED ORDER — ALBUTEROL SULFATE (2.5 MG/3ML) 0.083% IN NEBU: 2.5000 mg | INHALATION_SOLUTION | RESPIRATORY_TRACT | Status: DC | PRN

## 2024-09-24 MED ORDER — POLYETHYL GLYCOL-PROPYL GLYCOL 0.4-0.3 % OP GEL: Freq: Two times a day (BID) | OPHTHALMIC | Status: DC

## 2024-09-24 MED ORDER — EZETIMIBE 10 MG PO TABS
10.0000 mg | ORAL_TABLET | Freq: Every day | ORAL | Status: DC
  Administered 2024-09-26: 10 mg via ORAL
  Filled 2024-09-24 (×2): qty 1

## 2024-09-24 MED ORDER — HYDROCODONE-ACETAMINOPHEN 7.5-325 MG PO TABS
1.0000 | ORAL_TABLET | Freq: Four times a day (QID) | ORAL | Status: DC | PRN
  Administered 2024-09-24 – 2024-09-26 (×3): 1 via ORAL
  Filled 2024-09-24 (×3): qty 1

## 2024-09-24 MED ORDER — METHOCARBAMOL 500 MG PO TABS: 500.0000 mg | ORAL_TABLET | Freq: Three times a day (TID) | ORAL | Status: DC | PRN

## 2024-09-24 MED ORDER — BUSPIRONE HCL 5 MG PO TABS
10.0000 mg | ORAL_TABLET | Freq: Two times a day (BID) | ORAL | Status: DC
  Administered 2024-09-24 – 2024-09-26 (×3): 10 mg via ORAL
  Filled 2024-09-24 (×4): qty 2

## 2024-09-24 MED ORDER — PANTOPRAZOLE SODIUM 40 MG PO TBEC
80.0000 mg | DELAYED_RELEASE_TABLET | Freq: Every day | ORAL | Status: DC
  Administered 2024-09-24 – 2024-09-25 (×2): 80 mg via ORAL
  Filled 2024-09-24 (×2): qty 2

## 2024-09-24 MED ORDER — FAMOTIDINE 20 MG PO TABS
40.0000 mg | ORAL_TABLET | Freq: Every day | ORAL | Status: DC
  Administered 2024-09-26: 40 mg via ORAL
  Filled 2024-09-24 (×2): qty 2

## 2024-09-24 NOTE — ED Notes (Signed)
 BSC placed at bedside.  Placed pt back on monitor and flushed IV.  Non-skid sock.

## 2024-09-24 NOTE — Assessment & Plan Note (Signed)
 Continue 1000mg  keppra  BID  Seizure precautions

## 2024-09-24 NOTE — Assessment & Plan Note (Signed)
-?   Hx of orthostatic hypotension, on midodrine  15mg  BID -change to 10mg  BID -on coreg  3.125mg  BID  -would f/u with PCP for polypharmacy  -check orthostatics

## 2024-09-24 NOTE — Assessment & Plan Note (Addendum)
 A1C in September was 7.7  Hold metformin  and glipizide  Hold lantus as sugar is 100 and NPO midnight  States she has been on 30 units lantus, but has increased to 44 due to increasing blood sugars  SSI and accuchecks QAC/HS

## 2024-09-24 NOTE — Assessment & Plan Note (Signed)
 Continue topamax  100mg  BID

## 2024-09-24 NOTE — ED Provider Notes (Signed)
 ED Progress Note   Patient was seen and examined today during my daily  rounds.  Patient remains medically stable for transfer. Patient is still waiting for a bed at outside hospital. Patient's care discussed with nursing staff.  Events of the past 24 hours include: Patient has been doing well.  No issues reported overnight.  She has really not required pain medication.  Physical examination:  BP 144/61   Pulse 74   Temp 36.7 C (98.1 F) (Oral)   Resp 18   Ht 165.1 cm (5' 5)   Wt 69.5 kg (153 lb 3.2 oz)   LMP  (LMP Unknown)   SpO2 97%   BMI 25.49 kg/m   General: No cardiopulmonary distress. Cardiovascular: Regular rate and rhythm.  No murmurs. GI: Abdomen soft, nontender, normoactive bowel sounds.  Medical Decision Making:  Assessment and Plan:  Briefly, Cindy Norris is a 69 y.o. female  who presents with obstructive uropathy.  Patient is awaiting transfer to [facility].  He still has a bed.  I think patient can probably be discharged home with outpatient urology follow-up.  I will try to talk to her urologist to, with the plan.  9:20 AM We called the urologist office.  Still awaiting callback.  11:16 AM I have not received any callback from patient's urologist.  I have called again.  I am still awaiting callback.  1:50 PM Patient is doing well.  Still unable to reach urologist.   4:34 PM Transfer to [facility].  Patient is stable for transfer.  Diagnosis:  1. Hyperglycemia due to diabetes mellitus (CMS-HCC)   2. Calculus of ureter    Condition: Stable Disposition: Transfer  This chart has been completed using Engineer[employer] (consulting) software, and while attempts have been made to ensure accuracy, certain words and phrases may not be transcribed as intended.

## 2024-09-24 NOTE — Assessment & Plan Note (Signed)
 On gabapentin , robaxin Also on hydrocodone  7.5mg  Goes to pain clinic at [facility]

## 2024-09-24 NOTE — Telephone Encounter (Signed)
 Dr Cherie from [facility] ER called and patient has obstructive stone and needs to be seen asap  [phone]

## 2024-09-24 NOTE — Assessment & Plan Note (Signed)
 Continue lipitor  80mg  and zetia  10mg 

## 2024-09-24 NOTE — Assessment & Plan Note (Addendum)
 Not on cpap after losing weight

## 2024-09-24 NOTE — H&P (Signed)
 " History and Physical    Patient: Cindy Norris FMW:[id number] DOB: [birth year] DOA: 09/24/2024 DOS: the patient was seen and examined on 09/24/2024 PCP: Dow Longs, PA-C  Patient coming from: Outside Hospital - lives with her son and daughter in law. She uses a cane for ambulation.    Chief Complaint: hyperglycemia   HPI: Cindy Norris is a 68 y.o. female with medical history significant of  HTN, HLD, T2DM, OSA, depression and anxiety, seizures, and nephrolithiasis who presented to [facility] ED on 09/22/24  with mild confusion and hyperglycemia. She states her grandson told her to check her sugars and it read high so he called EMS. She denies any fever/chills or urinary symptoms. No CVA tenderness.  No change in urination. She states her blood sugars have been high and she has been having to increase her lantus. She also reports diarrhea that started Sunday. She has had several loose stools and reports being up all night Sunday night. No blood in her stool and resolved today.   Denies any fever/chills, vision changes, +headaches, chest pain or palpitations, shortness of breath or cough, abdominal pain, N/V/, dysuria or leg swelling.    Per accepting physician: She is afebrile wtih stable vitals. Serum glucose is 430 without DKA. No acute findings on head CT. CT abdomen/pelvis is concerning for right ureteral stone with mild hydronephrosis, ureteral thickening, and surrounding fat-stranding.   She does not smoke or drink alcohol .   ER Course:  vitals at [facility]: afebrile, bp: 136/61, HR: 71, RR: 18, oxygen: 96%RA Pertinent labs: glucose 430, UA with RBC, small blood  CT abdomen/pelvis: mild right sided hydronephrosis associated with a 5mm proximal right ureteral stone at the UPJ. Diffuse right urothelial wall thickening with surrounding fat stranding, could reflect a superimposed inflammatory or infectious pyelitis. No CT evidence of SBO. Bilateral nonobstructing nephrolithiasis measuring up to  10mm on the right.  CXR: wnl  CT head: no acute finding  In ED: Hospitalist at [facility] would not admit the patient there due to lack of urology coverage. ED physician, Dr. Alla, will consult our urologist and the patient will be accepted to our service if urology agrees to consult on her here.   Review of Systems: As mentioned in the history of present illness. All other systems reviewed and are negative. Past Medical History:  Diagnosis Date   Asthma    Chronic pain syndrome    Degenerative disc disease, lumbar    Depression    Diabetes mellitus    GERD (gastroesophageal reflux disease)    History of kidney stones    Hyperlipidemia, mixed    Migraines    Narcolepsy    Osteoarthritis    Overweight(278.02)    Obesity   Seizures (HCC)    unknown etiology-on meds-last seizure was several years ago   Unspecified essential hypertension    Past Surgical History:  Procedure Laterality Date   BREAST MASS EXCISION Right    CATARACT EXTRACTION W/PHACO Left 05/09/2022   Procedure: CATARACT EXTRACTION PHACO AND INTRAOCULAR LENS PLACEMENT (IOC);  Surgeon: Harrie Agent, MD;  Location: [facility];  Service: Ophthalmology;  Laterality: Left;  CDE: 10.22   CATARACT EXTRACTION W/PHACO Right 05/23/2022   Procedure: CATARACT EXTRACTION PHACO AND INTRAOCULAR LENS PLACEMENT (IOC);  Surgeon: Harrie Agent, MD;  Location: [facility];  Service: Ophthalmology;  Laterality: Right;  CDE 7.15   IR NEPHROURETERAL CATH PLACE RIGHT  12/21/2022   KNEE ARTHROSCOPY     NEPHROLITHOTOMY Right 12/22/2022   Procedure: NEPHROLITHOTOMY  PERCUTANEOUS;  Surgeon: Sherrilee Belvie CROME, MD;  Location: [facility];  Service: Urology;  Laterality: Right;   PILONIDAL CYST EXCISION     SHOULDER SURGERY Left    TOTAL KNEE ARTHROPLASTY Bilateral    Social History:  reports that she quit smoking about 36 years ago. Her smoking use included cigarettes. She started smoking about 40 years ago. She has a 2 pack-year smoking history. She has  never used smokeless tobacco. She reports that she does not drink alcohol  and does not use drugs.  Allergies[1]  Family History  Problem Relation Age of Onset   Coronary artery disease Other     Prior to Admission medications  Medication Sig Start Date End Date Taking? Authorizing Provider  albuterol  (VENTOLIN  HFA) 108 (90 Base) MCG/ACT inhaler Inhale 1-2 puffs into the lungs as needed for wheezing or shortness of breath.    [provider]  atorvastatin  (LIPITOR ) 80 MG tablet Take 80 mg by mouth at bedtime.    [provider]  busPIRone  (BUSPAR ) 10 MG tablet Take 10 mg by mouth 2 (two) times daily.    [provider]  carvedilol  (COREG ) 3.125 MG tablet Take 1 tablet (3.125 mg total) by mouth 2 (two) times daily. 09/06/24 12/05/24  [name], NP  ezetimibe  (ZETIA ) 10 MG tablet Take 10 mg by mouth daily. Patient not taking: Reported on 07/17/2024 07/29/22   [provider]  famotidine (PEPCID) 40 MG tablet Take 40 mg by mouth daily. 06/22/24   [provider]  gabapentin  (NEURONTIN ) 800 MG tablet Take 800 mg by mouth 3 (three) times daily.    [provider]  HYDROcodone -acetaminophen  (NORCO) 7.5-325 MG tablet Take 1 tablet by mouth every 6 (six) hours as needed for moderate pain. 12/06/22   [provider]  hydrOXYzine  (ATARAX /VISTARIL ) 25 MG tablet Take 25 mg by mouth 2 (two) times daily.    [provider]  ibuprofen  (ADVIL ,MOTRIN ) 800 MG tablet Take 800 mg by mouth every 8 (eight) hours as needed for moderate pain.    [provider]  LANTUS SOLOSTAR 100 UNIT/ML Solostar Pen Inject 40 Units into the skin daily. 05/09/24   [provider]  levETIRAcetam  (KEPPRA ) 1000 MG tablet Take 1,000 mg by mouth 2 (two) times daily.    [provider]  lidocaine  (LIDODERM ) 5 % Place 1 patch onto the skin daily as needed (pain). Remove & Discard patch within 12 hours or as directed by MD    [provider]  metFORMIN  (GLUCOPHAGE -XR) 500 MG 24 hr tablet Take 500 mg by mouth 2 (two) times daily.    [provider]  methocarbamol (ROBAXIN) 500 MG tablet Take 500 mg by mouth 3 (three) times daily as needed. 06/13/24   [provider]  midodrine  (PROAMATINE ) 10 MG tablet Take 15 mg by mouth 3 (three) times daily. 03/03/21   [provider]  modafinil  (PROVIGIL ) 200 MG tablet Take 400 mg by mouth every morning.     [provider]  Multiple Vitamin (MULTIVITAMIN WITH MINERALS) TABS tablet Take 1 tablet by mouth daily.    [provider]  nabumetone  (RELAFEN ) 750 MG tablet Take 750 mg by mouth 2 (two) times daily. 04/12/21   [provider]  omeprazole (PRILOSEC) 40 MG capsule Take 80 mg by mouth at bedtime. 04/07/21   [provider]  oxybutynin  (DITROPAN ) 5 MG tablet Take 5 mg by mouth 3 (three) times daily.    [provider]  oxyCODONE  (ROXICODONE )  5 MG immediate release tablet Take 1 tablet (5 mg total) by mouth every 4 (four) hours as needed for severe pain (pain score 7-10). 03/07/24   Yolande Lamar BROCKS, MD  Polyethyl Glycol-Propyl Glycol (SYSTANE OP) Place 1 drop into both eyes 2 (two) times daily.    [provider]  SUMAtriptan  (IMITREX ) 50 MG tablet Take 50-100 mg by mouth every 2 (two) hours as needed for migraine. May repeat in 2 hours if headache persists or recurs.    [provider]  topiramate  (TOPAMAX ) 100 MG tablet Take 100 mg by mouth 2 (two) times daily.    [provider]  venlafaxine  XR (EFFEXOR -XR) 150 MG 24 hr capsule Take 300 mg by mouth daily.    [provider]    Physical Exam: Vitals:   09/24/24 1750 09/24/24 1800 09/24/24 2142  BP:  (!) 145/74 (!) 153/54  Pulse:  70 79  Resp:  20 19  Temp:  98.4 F (36.9 C) 98 F (36.7 C)  TempSrc:  Oral Oral  SpO2:  100% 98%  Weight: 92.9 kg    Height: 5' 5 (1.651 m)     General:  Appears calm and comfortable and  is in NAD Eyes:  PERRL, EOMI, normal lids, iris ENT:  grossly normal hearing, lips & tongue, mmm; edentulous  Neck:  no LAD, masses or thyromegaly; no carotid bruits Cardiovascular:  RRR, no m/r/g. No LE edema.  Respiratory:   CTA bilaterally with no wheezes/rales/rhonchi.  Normal respiratory effort. Abdomen:  soft, NT, ND, NABS, ventral hernia  Back:   normal alignment, mild CVAT on the right  Skin:  no rash or induration seen on limited exam Musculoskeletal:  grossly normal tone BUE/BLE, good ROM, no bony abnormality Lower extremity:  No LE edema.  Limited foot exam with no ulcerations.  2+ distal pulses. Psychiatric:  grossly normal mood and affect, speech fluent and appropriate, AOx3 Neurologic:  CN 2-12 grossly intact, moves all extremities in coordinated fashion, sensation intact   Radiological Exams on Admission: Independently reviewed - see discussion in A/P where applicable  No results found.  EKG: Independently reviewed.  NSR with rate 64; nonspecific ST changes with no evidence of acute ischemia   Labs on Admission: I have personally reviewed the available labs and imaging studies at the time of the admission.  Pertinent labs:   glucose 430,  UA with RBC, small blood   Assessment and Plan: Principal Problem:   Ureteral obstruction, right Active Problems:   Type 2 diabetes mellitus without complications (HCC)   Essential hypertension   Fibromyalgia/DDD   Anxiety and depression   Seizures (HCC)   Migraine without aura   GERD (gastroesophageal reflux disease)   Hyperlipidemia   OSA (obstructive sleep apnea)    Assessment and Plan: * Ureteral obstruction, right 68 year old presenting to [facility] on 12/28 for hyperglycemia found to have a 5mm proximal right ureteral stone at the UPJ with diffuse right urothelial wall thickening with surrounding fat stranding -obs to med surg -urology consulted, Dr. Alvaro -plan for stent tomorrow -NPO midnight -continue with home  norco, no new acute pain -no SIRS criteria or signs of sepsis  -repeat CBC, CMP, INR now  -labs and imaging reviewed from [facility]    Type 2 diabetes mellitus without complications (HCC) A1C in September was 7.7  Hold metformin  and glipizide  Hold lantus as sugar is 100 and NPO midnight  States she has been on 30 units lantus, but has increased  to 44 due to increasing blood sugars  SSI and accuchecks QAC/HS   Essential hypertension -? Hx of orthostatic hypotension, on midodrine  15mg  BID -change to 10mg  BID -on coreg  3.125mg  BID  -would f/u with PCP for polypharmacy  -check orthostatics   Fibromyalgia/DDD On gabapentin , robaxin Also on hydrocodone  7.5mg  [facility] clinic [facility]   Anxiety and depression Continue effexor , bupsar and hydroxyzine    Seizures (HCC) Continue 1000mg  keppra  BID  Seizure precautions   Migraine without aura Continue topamax  100mg  BID   GERD (gastroesophageal reflux disease) Continue PPI and pepcid   Hyperlipidemia Continue lipitor  80mg  and zetia  10mg    OSA (obstructive sleep apnea) Not on cpap after losing weight     Advance Care Planning:   Code Status: Full Code    Consultants: Urology: Dr. Alvaro    Procedures:    Antibiotics: None     DVT Prophylaxis: SCDs  Family Communication: updated son by phone.   Severity of Illness: The appropriate patient status for this patient is OBSERVATION. Observation status is judged to be reasonable and necessary in order to provide the required intensity of service to ensure the patient's safety. The patient's presenting symptoms, physical exam findings, and initial radiographic and laboratory data in the context of their medical condition is felt to place them at decreased risk for further clinical deterioration. Furthermore, it is anticipated that the patient will be medically stable for discharge from the hospital within 2 midnights of admission.   Author: Isaiah Geralds,  MD 09/24/2024 10:48 PM  For on call review [email or web address].      [1]  Allergies Allergen Reactions   Sulfonamide Derivatives Hives and Rash   Dilaudid [Hydromorphone Hcl] Palpitations and Rash   Silicone Other (See Comments)    blisters   Azithromycin Nausea And Vomiting   Other     Dawn dish detergent-RASH   Tape Other (See Comments)    Blister   Erythromycin Nausea Only   Lyrica [Pregabalin] Swelling and Rash   Penicillins Nausea Only   Reglan [Metoclopramide] Palpitations   "

## 2024-09-24 NOTE — Assessment & Plan Note (Addendum)
 68 year old presenting to [facility] on 12/28 for hyperglycemia found to have a 5mm proximal right ureteral stone at the UPJ with diffuse right urothelial wall thickening with surrounding fat stranding -obs to med surg -urology consulted, Dr. Alvaro -plan for stent tomorrow -NPO midnight -continue with home norco, no new acute pain -no SIRS criteria or signs of sepsis  -repeat CBC, CMP, INR now  -labs and imaging reviewed from [facility]

## 2024-09-24 NOTE — Assessment & Plan Note (Signed)
 Continue effexor , bupsar and hydroxyzine 

## 2024-09-24 NOTE — Consult Note (Signed)
 Reason for Consult: Right Ureteral / Bilateral Renal Stones  Referring Physician: Isaiah Geralds MD  Cindy Norris is an 68 y.o. female.   HPI:    1- Right Ureteral / Bilateral Renal Stones - 5mm Rt prox stone and scattered bilateral renal stones up to 1cm each side per report on ER CT at [facility] 09/22/24. UA without infecitous paremeters. WBC 4.6 (normal), Cr 0.94 (normal). For unclear reasons patient held in ER there and transferred to [facility] med-surg floor. No fevers. Normally follows with Dr. Sherrilee for stones in [place].   PMH sig for DM2 (A1c 7-8), SZ,   Today Cindy Norris is seen in consultation for above. She is in no pain, afebrile, and transferred for stones. She is having Dr. Nunzio and crackers.    Past Medical History:  Diagnosis Date   Asthma    Chronic pain syndrome    Degenerative disc disease, lumbar    Depression    Diabetes mellitus    GERD (gastroesophageal reflux disease)    History of kidney stones    Hyperlipidemia, mixed    Migraines    Narcolepsy    Osteoarthritis    Overweight(278.02)    Obesity   Seizures (HCC)    unknown etiology-on meds-last seizure was several years ago   Unspecified essential hypertension     Past Surgical History:  Procedure Laterality Date   BREAST MASS EXCISION Right    CATARACT EXTRACTION W/PHACO Left 05/09/2022   Procedure: CATARACT EXTRACTION PHACO AND INTRAOCULAR LENS PLACEMENT (IOC);  Surgeon: Harrie Agent, MD;  Location: [facility];  Service: Ophthalmology;  Laterality: Left;  CDE: 10.22   CATARACT EXTRACTION W/PHACO Right 05/23/2022   Procedure: CATARACT EXTRACTION PHACO AND INTRAOCULAR LENS PLACEMENT (IOC);  Surgeon: Harrie Agent, MD;  Location: [facility];  Service: Ophthalmology;  Laterality: Right;  CDE 7.15   IR NEPHROURETERAL CATH PLACE RIGHT  12/21/2022   KNEE ARTHROSCOPY     NEPHROLITHOTOMY Right 12/22/2022   Procedure: NEPHROLITHOTOMY PERCUTANEOUS;  Surgeon: Sherrilee Belvie CROME, MD;  Location: [facility];   Service: Urology;  Laterality: Right;   PILONIDAL CYST EXCISION     SHOULDER SURGERY Left    TOTAL KNEE ARTHROPLASTY Bilateral     Family History  Problem Relation Age of Onset   Coronary artery disease Other     Social History:  reports that she quit smoking about 36 years ago. Her smoking use included cigarettes. She started smoking about 40 years ago. She has a 2 pack-year smoking history. She has never used smokeless tobacco. She reports that she does not drink alcohol  and does not use drugs.  Allergies: Allergies[1]  Medications: I have reviewed the patient's current medications.  Results for orders placed or performed during the hospital encounter of 09/24/24 (from the past 48 hours)  Glucose, capillary     Status: Abnormal   Collection Time: 09/24/24  9:39 PM  Result Value Ref Range   Glucose-Capillary 105 (H) 70 - 99 mg/dL    Comment: Glucose reference range applies only to samples taken after fasting for at least 8 hours.    No results found.  Review of Systems  Constitutional:  Negative for chills and fever.  Genitourinary:  Positive for flank pain.  All other systems reviewed and are negative.  Blood pressure (!) 153/54, pulse 79, temperature 98 F (36.7 C), temperature source Oral, resp. rate 19, height 5' 5 (1.651 m), weight 92.9 kg, SpO2 98%. Physical Exam Vitals reviewed.  Constitutional:  Comments: Blunt affect, cooperative.   HENT:     Head: Normocephalic.  Eyes:     Pupils: Pupils are equal, round, and reactive to light.  Cardiovascular:     Rate and Rhythm: Normal rate.  Pulmonary:     Effort: Pulmonary effort is normal.  Abdominal:     Comments: Very large truncal obesity.   Genitourinary:    Comments: Some mild CVAT on right.  Musculoskeletal:        General: Normal range of motion.     Cervical back: Normal range of motion.  Skin:    General: Skin is warm.  Neurological:     General: No focal deficit present.     Mental Status: She  is alert.  Psychiatric:        Mood and Affect: Mood normal.     Assessment/Plan:  Discussed options of going home (she has no acute indicaions) v. Bilateral stenting this admission then going home to have her elective stones addressed in elective setting. Goal of stents is temporizing / reducing obstruction / preventing obstructing pyelo. She wants to proceed. NPO p MN for bilateral stents tomorrow.   Appreciate hospitalist team comanagment, it is unfortunate this purely elective patient is utilizing so many in house recourses when beds are very scarce.   Ricardo KATHEE Alvaro Mickey. 09/24/2024, 10:15 PM         [1]  Allergies Allergen Reactions   Sulfonamide Derivatives Hives and Rash   Dilaudid [Hydromorphone Hcl] Palpitations and Rash   Silicone Other (See Comments)    blisters   Azithromycin Nausea And Vomiting   Other     Dawn dish detergent-RASH   Tape Other (See Comments)    Blister   Erythromycin Nausea Only   Lyrica [Pregabalin] Swelling and Rash   Penicillins Nausea Only   Reglan [Metoclopramide] Palpitations

## 2024-09-24 NOTE — Telephone Encounter (Signed)
 Son just called for appt asap has been in ER in [place] since yesterday

## 2024-09-24 NOTE — ED Notes (Signed)
 Called pts son Lamar and given transfer information to [facility] [phone].

## 2024-09-24 NOTE — Assessment & Plan Note (Signed)
Continue PPI and pepcid.

## 2024-09-25 ENCOUNTER — Observation Stay (HOSPITAL_COMMUNITY): Admitting: Anesthesiology

## 2024-09-25 ENCOUNTER — Observation Stay (HOSPITAL_COMMUNITY)

## 2024-09-25 ENCOUNTER — Encounter (HOSPITAL_COMMUNITY): Disposition: A | Discharge status: Home / Self Care | Payer: Self-pay | Attending: Family Medicine

## 2024-09-25 ENCOUNTER — Encounter (HOSPITAL_COMMUNITY): Payer: Self-pay | Admitting: Family Medicine

## 2024-09-25 DIAGNOSIS — J45909 Unspecified asthma, uncomplicated: Secondary | ICD-10-CM

## 2024-09-25 DIAGNOSIS — I10 Essential (primary) hypertension: Secondary | ICD-10-CM | POA: Diagnosis not present

## 2024-09-25 DIAGNOSIS — N200 Calculus of kidney: Secondary | ICD-10-CM

## 2024-09-25 DIAGNOSIS — N132 Hydronephrosis with renal and ureteral calculous obstruction: Secondary | ICD-10-CM | POA: Diagnosis not present

## 2024-09-25 DIAGNOSIS — R569 Unspecified convulsions: Secondary | ICD-10-CM | POA: Diagnosis not present

## 2024-09-25 DIAGNOSIS — Z87891 Personal history of nicotine dependence: Secondary | ICD-10-CM | POA: Diagnosis not present

## 2024-09-25 DIAGNOSIS — N201 Calculus of ureter: Secondary | ICD-10-CM | POA: Diagnosis not present

## 2024-09-25 DIAGNOSIS — F32A Depression, unspecified: Secondary | ICD-10-CM

## 2024-09-25 DIAGNOSIS — N202 Calculus of kidney with calculus of ureter: Secondary | ICD-10-CM

## 2024-09-25 DIAGNOSIS — E119 Type 2 diabetes mellitus without complications: Secondary | ICD-10-CM | POA: Diagnosis not present

## 2024-09-25 DIAGNOSIS — F419 Anxiety disorder, unspecified: Secondary | ICD-10-CM

## 2024-09-25 HISTORY — PX: CYSTOSCOPY W/ URETERAL STENT PLACEMENT: SHX1429

## 2024-09-25 LAB — BASIC METABOLIC PANEL WITH GFR
Anion gap: 13 (ref 5–15)
BUN: 14 mg/dL (ref 8–23)
CO2: 20 mmol/L — ABNORMAL LOW (ref 22–32)
Calcium: 10 mg/dL (ref 8.9–10.3)
Chloride: 109 mmol/L (ref 98–111)
Creatinine, Ser: 0.68 mg/dL (ref 0.44–1.00)
GFR, Estimated: >60 mL/min
Glucose, Bld: 201 mg/dL — ABNORMAL HIGH (ref 70–99)
Potassium: 3.1 mmol/L — ABNORMAL LOW (ref 3.5–5.1)
Sodium: 141 mmol/L (ref 135–145)

## 2024-09-25 LAB — COMPREHENSIVE METABOLIC PANEL WITH GFR
ALT: 41 U/L (ref 0–44)
AST: 26 U/L (ref 15–41)
Albumin: 4.1 g/dL (ref 3.5–5.0)
Alkaline Phosphatase: 138 U/L — ABNORMAL HIGH (ref 38–126)
Anion gap: 13 (ref 5–15)
BUN: 14 mg/dL (ref 8–23)
CO2: 20 mmol/L — ABNORMAL LOW (ref 22–32)
Calcium: 10.1 mg/dL (ref 8.9–10.3)
Chloride: 108 mmol/L (ref 98–111)
Creatinine, Ser: 0.72 mg/dL (ref 0.44–1.00)
GFR, Estimated: >60 mL/min
Glucose, Bld: 157 mg/dL — ABNORMAL HIGH (ref 70–99)
Potassium: 3.1 mmol/L — ABNORMAL LOW (ref 3.5–5.1)
Sodium: 141 mmol/L (ref 135–145)
Total Bilirubin: 0.4 mg/dL (ref 0.0–1.2)
Total Protein: 6.6 g/dL (ref 6.5–8.1)

## 2024-09-25 LAB — CBC
HCT: 43.7 % (ref 36.0–46.0)
Hemoglobin: 14.5 g/dL (ref 12.0–15.0)
MCH: 29.2 pg (ref 26.0–34.0)
MCHC: 33.2 g/dL (ref 30.0–36.0)
MCV: 87.9 fL (ref 80.0–100.0)
Platelets: 242 K/uL (ref 150–400)
RBC: 4.97 MIL/uL (ref 3.87–5.11)
RDW: 11.6 % (ref 11.5–15.5)
WBC: 5.8 K/uL (ref 4.0–10.5)
nRBC: 0 % (ref 0.0–0.2)

## 2024-09-25 LAB — CBC WITH DIFFERENTIAL/PLATELET
Abs Immature Granulocytes: 0.02 K/uL (ref 0.00–0.07)
Basophils Absolute: 0 K/uL (ref 0.0–0.1)
Basophils Relative: 0 %
Eosinophils Absolute: 0.2 K/uL (ref 0.0–0.5)
Eosinophils Relative: 3 %
HCT: 41.8 % (ref 36.0–46.0)
Hemoglobin: 14.1 g/dL (ref 12.0–15.0)
Immature Granulocytes: 0 %
Lymphocytes Relative: 31 %
Lymphs Abs: 1.8 K/uL (ref 0.7–4.0)
MCH: 29.3 pg (ref 26.0–34.0)
MCHC: 33.7 g/dL (ref 30.0–36.0)
MCV: 86.9 fL (ref 80.0–100.0)
Monocytes Absolute: 0.5 K/uL (ref 0.1–1.0)
Monocytes Relative: 8 %
Neutro Abs: 3.4 K/uL (ref 1.7–7.7)
Neutrophils Relative %: 58 %
Platelets: 225 K/uL (ref 150–400)
RBC: 4.81 MIL/uL (ref 3.87–5.11)
RDW: 11.5 % (ref 11.5–15.5)
WBC: 5.9 K/uL (ref 4.0–10.5)
nRBC: 0 % (ref 0.0–0.2)

## 2024-09-25 LAB — GLUCOSE, CAPILLARY
Glucose-Capillary: 160 mg/dL — ABNORMAL HIGH (ref 70–99)
Glucose-Capillary: 184 mg/dL — ABNORMAL HIGH (ref 70–99)
Glucose-Capillary: 171 mg/dL — ABNORMAL HIGH (ref 70–99)
Glucose-Capillary: 266 mg/dL — ABNORMAL HIGH (ref 70–99)

## 2024-09-25 LAB — PROTIME-INR
INR: 1 (ref 0.8–1.2)
Prothrombin Time: 14.3 s (ref 11.4–15.2)

## 2024-09-25 LAB — HIV ANTIBODY (ROUTINE TESTING W REFLEX): HIV Screen 4th Generation wRfx: NONREACTIVE

## 2024-09-25 MED ORDER — ACETAMINOPHEN 10 MG/ML IV SOLN
INTRAVENOUS | Status: AC
  Filled 2024-09-25: qty 100

## 2024-09-25 MED ORDER — INSULIN GLARGINE-YFGN 100 UNIT/ML ~~LOC~~ SOLN
10.0000 [IU] | Freq: Every day | SUBCUTANEOUS | Status: DC
  Administered 2024-09-25: 10 [IU] via SUBCUTANEOUS
  Filled 2024-09-25 (×2): qty 0.1

## 2024-09-25 MED ORDER — FENTANYL CITRATE (PF) 50 MCG/ML IJ SOSY
PREFILLED_SYRINGE | INTRAMUSCULAR | Status: AC
  Filled 2024-09-25: qty 1

## 2024-09-25 MED ORDER — LACTATED RINGERS IV SOLN: INTRAVENOUS | Status: DC

## 2024-09-25 MED ORDER — ACETAMINOPHEN 10 MG/ML IV SOLN
INTRAVENOUS | Status: DC | PRN
  Administered 2024-09-25: 1000 mg via INTRAVENOUS

## 2024-09-25 MED ORDER — ACETAMINOPHEN 10 MG/ML IV SOLN: 1000.0000 mg | Freq: Once | INTRAVENOUS | Status: DC | PRN

## 2024-09-25 MED ORDER — LACTATED RINGERS IV SOLN: INTRAVENOUS | Status: DC | PRN

## 2024-09-25 MED ORDER — LIDOCAINE 2% (20 MG/ML) 5 ML SYRINGE
INTRAMUSCULAR | Status: DC | PRN
  Administered 2024-09-25: 80 mg via INTRAVENOUS

## 2024-09-25 MED ORDER — PROPOFOL 10 MG/ML IV BOLUS
INTRAVENOUS | Status: DC | PRN
  Administered 2024-09-25: 130 mg via INTRAVENOUS

## 2024-09-25 MED ORDER — MIDAZOLAM HCL (PF) 2 MG/2ML IJ SOLN
INTRAMUSCULAR | Status: DC | PRN
  Administered 2024-09-25: 1 mg via INTRAVENOUS

## 2024-09-25 MED ORDER — POTASSIUM CHLORIDE CRYS ER 20 MEQ PO TBCR
40.0000 meq | EXTENDED_RELEASE_TABLET | Freq: Once | ORAL | Status: AC
  Administered 2024-09-25: 40 meq via ORAL
  Filled 2024-09-25: qty 2

## 2024-09-25 MED ORDER — FENTANYL CITRATE (PF) 100 MCG/2ML IJ SOLN
INTRAMUSCULAR | Status: AC
  Filled 2024-09-25: qty 2

## 2024-09-25 MED ORDER — MIDAZOLAM HCL 2 MG/2ML IJ SOLN
INTRAMUSCULAR | Status: AC
  Filled 2024-09-25: qty 2

## 2024-09-25 MED ORDER — INSULIN ASPART 100 UNIT/ML IJ SOLN
0.0000 [IU] | INTRAMUSCULAR | Status: DC | PRN
  Filled 2024-09-25: qty 4

## 2024-09-25 MED ORDER — CHLORHEXIDINE GLUCONATE 0.12 % MT SOLN
15.0000 mL | Freq: Once | OROMUCOSAL | Status: AC
  Administered 2024-09-25: 15 mL via OROMUCOSAL

## 2024-09-25 MED ORDER — FENTANYL CITRATE (PF) 50 MCG/ML IJ SOSY
25.0000 ug | PREFILLED_SYRINGE | INTRAMUSCULAR | Status: DC | PRN
  Administered 2024-09-25 (×2): 50 ug via INTRAVENOUS

## 2024-09-25 MED ORDER — IOHEXOL 300 MG/ML  SOLN
INTRAMUSCULAR | Status: DC | PRN
  Administered 2024-09-25: 50 mL

## 2024-09-25 MED ORDER — SODIUM CHLORIDE 0.9 % IR SOLN
Status: DC | PRN
  Administered 2024-09-25: 3000 mL

## 2024-09-25 MED ORDER — ONDANSETRON HCL 4 MG/2ML IJ SOLN: 4.0000 mg | Freq: Once | INTRAMUSCULAR | Status: DC | PRN

## 2024-09-25 MED ORDER — CEFAZOLIN SODIUM-DEXTROSE 2-4 GM/100ML-% IV SOLN
INTRAVENOUS | Status: AC
  Filled 2024-09-25: qty 100

## 2024-09-25 MED ORDER — CEFAZOLIN SODIUM-DEXTROSE 2-4 GM/100ML-% IV SOLN
2.0000 g | Freq: Once | INTRAVENOUS | Status: AC
  Administered 2024-09-25: 2 g via INTRAVENOUS

## 2024-09-25 NOTE — Care Management Obs Status (Signed)
 MEDICARE OBSERVATION STATUS NOTIFICATION   Patient Details  Name: TESA MEADORS MRN: [id number] Date of Birth: 16-Nov-1955   Medicare Observation Status Notification Given:  Yes    MahabirNathanel, RN 09/25/2024, 3:25 PM

## 2024-09-25 NOTE — TOC Initial Note (Signed)
 Transition of Care [facility]) - Initial/Assessment Note    Patient Details  Name: Cindy Norris MRN: [id number] Date of Birth: August 19, 1956  Transition of Care [facility]) CM/SW Contact:    Bascom Service, RN Phone Number: 09/25/2024, 3:24 PM  Clinical Narrative:  d/c plan home.                 Expected Discharge Plan: Home/Self Care Barriers to Discharge: Continued Medical Work up   Patient Goals and CMS Choice Patient states their goals for this hospitalization and ongoing recovery are:: Home CMS Medicare.gov Compare Post Acute Care list provided to:: Patient Choice offered to / list presented to : Patient      Expected Discharge Plan and Services   Discharge Planning Services: CM Consult                                          Prior Living Arrangements/Services                       Activities of Daily Living   ADL Screening (condition at time of admission) Independently performs ADLs?: Yes (appropriate for developmental age) Is the patient deaf or have difficulty hearing?: No Does the patient have difficulty seeing, even when wearing glasses/contacts?: No Does the patient have difficulty concentrating, remembering, or making decisions?: No  Permission Sought/Granted                  Emotional Assessment              Admission diagnosis:  Ureteral obstruction [N13.5] Ureteral obstruction, right [N13.5] Patient Active Problem List   Diagnosis Date Noted   Ureteral obstruction, right 09/24/2024   Hyperlipidemia 09/24/2024   Seizures (HCC) 09/24/2024   Anxiety and depression 09/24/2024   GERD (gastroesophageal reflux disease) 09/24/2024   Fibromyalgia/DDD 09/24/2024   ERRONEOUS ENCOUNTER--DISREGARD 05/07/2024   Renal calculus, right 12/22/2022   Atypical chest pain 12/16/2022   OSA (obstructive sleep apnea) 12/16/2022   Preop cardiovascular exam 12/16/2022   Type 2 diabetes mellitus without complications (HCC) 11/13/2017   Migraine  without aura 11/13/2017   HYPERLIPIDEMIA-MIXED 06/02/2009   Overweight 06/02/2009   Essential hypertension 06/02/2009   PCP:  Dow Longs, PA-C Pharmacy:   [facility] - [place], [place] - [address] [place] [address] Phone: [phone] Fax: [phone]     Social Drivers of Health (SDOH) Social History: SDOH Screenings   Food Insecurity: No Food Insecurity (09/24/2024)  Housing: Low Risk (09/24/2024)  Transportation Needs: No Transportation Needs (09/24/2024)  Utilities: Not At Risk (09/24/2024)  Depression (PHQ2-9): High Risk (06/20/2024)  Financial Resource Strain: Medium Risk (09/08/2023)   Received from [facility]  Physical Activity: Inactive (07/17/2024)  Social Connections: Moderately Isolated (09/24/2024)  Stress: Stress Concern Present (06/20/2024)  Tobacco Use: Medium Risk (09/25/2024)  Health Literacy: Adequate Health Literacy (07/17/2024)   SDOH Interventions:     Readmission Risk Interventions     No data to display

## 2024-09-25 NOTE — Plan of Care (Signed)
  Problem: Education: Goal: Knowledge of General Education information will improve Description: Including pain rating scale, medication(s)/side effects and non-pharmacologic comfort measures Outcome: Progressing   Problem: Coping: Goal: Level of anxiety will decrease Outcome: Progressing   Problem: Pain Managment: Goal: General experience of comfort will improve and/or be controlled Outcome: Progressing

## 2024-09-25 NOTE — Progress Notes (Signed)
*   Day of Surgery *   Subjective/Chief Complaint:   1- Right Ureteral / Bilateral Renal Stones - 5mm Rt prox stone and scattered bilateral renal stones up to 1cm each side per report on ER CT at [facility] 09/22/24. UA without infecitous paremeters. WBC 4.6 (normal), Cr 0.94 (normal). For unclear reasons patient held in ER there and transferred to [facility] med-surg floor. No fevers. Normally follows with Dr. Sherrilee for stones in [place].   Today Cindy Norris is seen to proceed with bilateral stent placement.   Objective: Vital signs in last 24 hours: Temp:  [98 F (36.7 C)-98.6 F (37 C)] 98.2 F (36.8 C) (12/31 1147) Pulse Rate:  [70-89] 88 (12/31 1147) Resp:  [16-20] 16 (12/31 1147) BP: (118-160)/(54-89) 134/88 (12/31 1147) SpO2:  [95 %-100 %] 97 % (12/31 1147) Weight:  [92.5 kg-92.9 kg] 92.5 kg (12/31 1300) Last BM Date : 09/24/24  Intake/Output from previous day: No intake/output data recorded. Intake/Output this shift: No intake/output data recorded.  NAD, AOx3 NLB-RA SNTND Mild Rt CVAT (stable) NO c/c/e   Lab Results:  Recent Labs    09/24/24 2348 09/25/24 0612  WBC 5.9 5.8  HGB 14.1 14.5  HCT 41.8 43.7  PLT 225 242   BMET Recent Labs    09/24/24 2348 09/25/24 0612  NA 141 141  K 3.1* 3.1*  CL 108 109  CO2 20* 20*  GLUCOSE 157* 201*  BUN 14 14  CREATININE 0.72 0.68  CALCIUM  10.1 10.0   PT/INR Recent Labs    09/24/24 2348  LABPROT 14.3  INR 1.0   ABG No results for input(s): PHART, HCO3 in the last 72 hours.  Invalid input(s): PCO2, PO2  Studies/Results: No results found.  Anti-infectives: Anti-infectives (From admission, onward)    Start     Dose/Rate Route Frequency Ordered Stop   09/25/24 1330  ceFAZolin  (ANCEF ) IVPB 2g/100 mL premix        2 g 200 mL/hr over 30 Minutes Intravenous  Once 09/25/24 1323         Assessment/Plan:  Proceed as planned with cysto and bilateral stent placement. Risks, benefits, alternatives,  expected peri-op course discussed previously and reiterated today.   Ricardo KATHEE Alvaro Mickey. 09/25/2024

## 2024-09-25 NOTE — Brief Op Note (Signed)
 09/25/2024  2:11 PM  PATIENT:  Niels KANDICE Dawn  68 y.o. female  PRE-OPERATIVE DIAGNOSIS:  kidney stones,bilateral  POST-OPERATIVE DIAGNOSIS:  kidney stones,bilateral  PROCEDURE:  Procedures: CYSTOSCOPY, WITH RETROGRADE PYELOGRAM AND URETERAL STENT INSERTION (Bilateral)  SURGEON:  Surgeons and Role:    * Manny, Ricardo KATHEE Raddle., MD - Primary  PHYSICIAN ASSISTANT:   ASSISTANTS: none   ANESTHESIA:   general  EBL:  minimal   BLOOD ADMINISTERED:none  DRAINS: none   LOCAL MEDICATIONS USED:  NONE  SPECIMEN:  No Specimen  DISPOSITION OF SPECIMEN:  N/A  COUNTS:  YES  TOURNIQUET:  * No tourniquets in log *  DICTATION: .Other Dictation: Dictation Number [id number]  PLAN OF CARE: Admit to inpatient   PATIENT DISPOSITION:  PACU - hemodynamically stable.   Delay start of Pharmacological VTE agent (>24hrs) due to surgical blood loss or risk of bleeding: not applicable

## 2024-09-25 NOTE — Anesthesia Postprocedure Evaluation (Signed)
"   Anesthesia Post Note  Patient: Cindy Norris  Procedure(s) Performed: CYSTOSCOPY, WITH RETROGRADE PYELOGRAM AND URETERAL STENT INSERTION (Bilateral)     Patient location during evaluation: PACU Anesthesia Type: General Level of consciousness: awake and alert Pain management: pain level controlled Vital Signs Assessment: post-procedure vital signs reviewed and stable Respiratory status: spontaneous breathing, nonlabored ventilation, respiratory function stable and patient connected to nasal cannula oxygen Cardiovascular status: blood pressure returned to baseline and stable Postop Assessment: no apparent nausea or vomiting Anesthetic complications: no   No notable events documented.  Last Vitals:  Vitals:   09/25/24 1515 09/25/24 1536  BP: 124/62 (!) 143/79  Pulse: 66 75  Resp: 11 17  Temp: 36.7 C 36.5 C  SpO2: 96% 95%    Last Pain:  Vitals:   09/25/24 1537  TempSrc:   PainSc: 0-No pain                 Garnette LABOR Oshua Mcconaha      "

## 2024-09-25 NOTE — Hospital Course (Signed)
 68 year old woman presenting with confusion and hyperglycemia.  Transferred from [facility] after imaging showed bilateral nonobstructing nephrolithiasis for urology consultation.  [facility]ultants Urology   Procedures/Events 12/31 cystoscopy, bilateral stent insertion

## 2024-09-25 NOTE — Progress Notes (Signed)
" °  Progress Note   Patient: Cindy Norris FMW:[id number] DOB: [birth year] DOA: 09/24/2024     1 DOS: the patient was seen and examined on 09/25/2024   Brief hospital course: 68 year old woman presenting with confusion and hyperglycemia.  Transferred from [facility] after imaging showed bilateral nonobstructing nephrolithiasis for urology consultation.  [facility]ultants Urology   Procedures/Events 12/31 cystoscopy, bilateral stent insertion  Assessment and Plan: Right Ureteral / Bilateral Renal Stones  Renal function stable.  No evidence of infection. Status post bilateral stents.  Discussed with Dr. Patrcia, can likely discharge home tomorrow.  Diabetes mellitus type 2 CBG stable.  Holding oral agents. Can resume long-acting insulin , but at a lower dose.  Essential hypertension Holding midodrine  now for hypertension Continue Coreg   Anxiety Depression Fibromyalgia Stable, continue gabapentin , Effexor , BuSpar , hydroxyzine   Seizure disorder Continue Keppra   Migraines Continue Topamax       Subjective:  Feels okay postprocedure but does have some pain  Physical Exam: Vitals:   09/25/24 1445 09/25/24 1500 09/25/24 1515 09/25/24 1536  BP: (!) 153/66 (!) 136/49 124/62 (!) 143/79  Pulse: 64 61 66 75  Resp: 12 11 11 17   Temp:   98 F (36.7 C) 97.7 F (36.5 C)  TempSrc:    Oral  SpO2: 90% 96% 96% 95%  Weight:      Height:       Physical Exam Vitals reviewed.  Constitutional:      General: She is not in acute distress.    Appearance: She is not ill-appearing or toxic-appearing.  Cardiovascular:     Rate and Rhythm: Normal rate and regular rhythm.     Heart sounds: No murmur heard. Pulmonary:     Effort: Pulmonary effort is normal. No respiratory distress.     Breath sounds: No wheezing, rhonchi or rales.  Neurological:     Mental Status: She is alert.  Psychiatric:        Mood and Affect: Mood normal.        Behavior: Behavior normal.     Data  Reviewed: CBG stable Potassium 3.1 CBC noted  Family Communication: Disposition: Status is: Observation      Time spent: 35 minutes  Author: Toribio Door, MD 09/25/2024 7:02 PM  For on call review [email or web address].    "

## 2024-09-25 NOTE — Transfer of Care (Signed)
 Immediate Anesthesia Transfer of Care Note  Patient: Cindy Norris  Procedure(s) Performed: CYSTOSCOPY, WITH RETROGRADE PYELOGRAM AND URETERAL STENT INSERTION (Bilateral)  Patient Location: PACU  Anesthesia Type:General  Level of Consciousness: awake, alert , oriented, and patient cooperative  Airway & Oxygen Therapy: Patient Spontanous Breathing and Patient connected to face mask oxygen  Post-op Assessment: Report given to RN and Post -op Vital signs reviewed and stable  Post vital signs: Reviewed and stable  Last Vitals:  Vitals Value Taken Time  BP 129/77 09/25/24 14:20  Temp    Pulse 73 09/25/24 14:26  Resp    SpO2 100 % 09/25/24 14:26  Vitals shown include unfiled device data.  Last Pain:  Vitals:   09/25/24 1147  TempSrc: Oral  PainSc:          Complications: No notable events documented.

## 2024-09-25 NOTE — Anesthesia Preprocedure Evaluation (Addendum)
 "                                  Anesthesia Evaluation  Patient identified by MRN, date of birth, ID band Patient awake    Reviewed: Allergy & Precautions, NPO status , Patient's Chart, lab work & pertinent test results  History of Anesthesia Complications (+) DIFFICULT AIRWAY and history of anesthetic complications  Airway Mallampati: III  TM Distance: >3 FB Neck ROM: Full    Dental no notable dental hx. (+) Edentulous Upper, Edentulous Lower   Pulmonary asthma , sleep apnea , former smoker   Pulmonary exam normal breath sounds clear to auscultation       Cardiovascular hypertension, (-) angina (-) Past MI Normal cardiovascular exam Rhythm:Regular Rate:Normal     Neuro/Psych  Headaches, Seizures -,   Anxiety Depression     Neuromuscular disease    GI/Hepatic ,GERD  Controlled and Medicated,,  Endo/Other  diabetes, Insulin  Dependent    Renal/GU Lab Results      Component                Value               Date                           K                        3.1 (L)             09/25/2024                    CREATININE               0.68                09/25/2024                   Musculoskeletal  (+) Arthritis ,  Fibromyalgia -  Abdominal   Peds  Hematology Lab Results      Component                Value               Date                      WBC                      5.8                 09/25/2024                HGB                      14.5                09/25/2024                HCT                      43.7                09/25/2024                MCV  87.9                09/25/2024                PLT                      242                 09/25/2024              Anesthesia Other Findings All : See list  Reproductive/Obstetrics                              Anesthesia Physical Anesthesia Plan  ASA: 3  Anesthesia Plan: General   Post-op Pain Management: Ofirmev  IV (intra-op)*    Induction: Intravenous  PONV Risk Score and Plan: Treatment may vary due to age or medical condition, Midazolam  and Ondansetron   Airway Management Planned: LMA  Additional Equipment: None  Intra-op Plan:   Post-operative Plan: Extubation in OR  Informed Consent: I have reviewed the patients History and Physical, chart, labs and discussed the procedure including the risks, benefits and alternatives for the proposed anesthesia with the patient or authorized representative who has indicated his/her understanding and acceptance.     Dental advisory given  Plan Discussed with: CRNA and Surgeon  Anesthesia Plan Comments:          Anesthesia Quick Evaluation  "

## 2024-09-25 NOTE — Anesthesia Procedure Notes (Signed)
 Procedure Name: LMA Insertion Date/Time: 09/25/2024 1:54 PM  Performed by: Nada Corean CROME, CRNAPre-anesthesia Checklist: Emergency Drugs available, Patient identified, Suction available, Patient being monitored and Timeout performed Patient Re-evaluated:Patient Re-evaluated prior to induction Oxygen Delivery Method: Circle system utilized Preoxygenation: Pre-oxygenation with 100% oxygen Induction Type: IV induction Ventilation: Mask ventilation without difficulty LMA: LMA inserted LMA Size: 4.0 Tube type: Oral Number of attempts: 1 Placement Confirmation: positive ETCO2 Tube secured with: Tape Dental Injury: Teeth and Oropharynx as per pre-operative assessment

## 2024-09-25 NOTE — Telephone Encounter (Signed)
 I gave Dr Cherie the number to [facility] for the on call urologist

## 2024-09-25 NOTE — Op Note (Unsigned)
 NAME: Cindy Norris, NATER MEDICAL RECORD NO: [id number] ACCOUNT NO: [id number] DATE OF BIRTH: 01/31/1956 FACILITY: [facility] LOCATION: [facility] PHYSICIAN: Ricardo Likens, MD  Operative Report   SURGEON:  Ricardo Likens, MD.  PREOPERATIVE DIAGNOSES:  Right ureteral and bilateral renal stones.  PROCEDURE PERFORMED: 1.  Cystoscopy with bilateral retrograde pyelograms, interpretation. 2.  Placement of bilateral ureteral stents.  ESTIMATED BLOOD LOSS:  Nil.  COMPLICATIONS:  None.  SPECIMENS:  None.  FINDINGS: 1.  Mild right hydronephrosis filling defect in the proximal right ureter. 2.  Unremarkable left retrograde pyelogram. 3.  Successful placement of bilateral ureteral stents.  INDICATIONS:  The patient is a 68 year old lady with a history of recurrent urolithiasis usually managed at our [place] office.  She was found on workup for colicky flank pain to have a right proximal ureteral stone and also some bilateral renal  stones by providers at the [facility].  Despite having no infectious parameters or acute renal failure or other acute worrisome findings, they felt strongly she needed to be urgently transferred to a facility that had urology MD  coverage.  This was arranged.  She finally transferred to our facility last night where she was evaluated and again found to not have any emergent parameters.  Options discussed with the patient including discharging home with management of her stone in  the elective setting versus stenting this hospitalization as a temporizing measure and definitive management in the outpatient setting and she wished to proceed with this.  Informed consent was obtained and placed in the medical record.  DESCRIPTION OF PROCEDURE:  The patient was identified and verified.  Procedure being cysto and bilateral stent placement was confirmed.  Procedure timeout was performed.  Intravenous antibiotics were administered.  General LMA anesthesia was introduced.    The patient was placed into a low lithotomy position.  A sterile field was created.  Prepping and draping the patient's vagina, introitus and proximal thigh using iodine .  Cystourethroscopy was performed using a 21-French rigid cystoscope with offset  lens.  Inspection of the urinary bladder revealed no diverticula, calcifications, or papillary lesions.  The right ureteral orifice was cannulated with a 6-French end-hole catheter and a right retrograde pyelogram was obtained.   Right retrograde pyelogram demonstrated single right ureter, single system right kidney.  There was a filling defect in the proximal ureter consistent with known stone with mild hydronephrosis above this.  A 0.038 ZIPwire was advanced to the level of the  lower pole over which a new 6 x 24 Contour type stent was carefully placed using cystoscopic and fluoroscopic guidance.  Good proximal and distal planes were noted.  Next, a left retrograde pyelogram was obtained.  Left retrograde pyelogram demonstrated a single left ureter, single system left kidney.  No filling defects or narrowing noted.  A sensor wire was advanced over which a separate 6 x 24 Contour type stent was carefully placed.  Good proximal and distal  planes were noted.  The bladder was emptied via cystoscope.  The procedure was then terminated.  The patient tolerated the procedure well.  No immediate periprocedural complications.  The patient was taken to the postanesthesia care unit in stable  condition.  Plan for discharge home likely today or tomorrow.  We will request outpatient follow-up with her primary urologist in [place] in the elective setting to discuss definitive management of her stones either with lithotripsy or ureteroscopy.   NIK D: 09/25/2024 2:16:03 pm T: 09/25/2024 10:36:00 pm  JOB: [id number]/ [id number]

## 2024-09-26 ENCOUNTER — Encounter (HOSPITAL_COMMUNITY): Payer: Self-pay | Admitting: Urology

## 2024-09-26 DIAGNOSIS — N132 Hydronephrosis with renal and ureteral calculous obstruction: Secondary | ICD-10-CM | POA: Diagnosis not present

## 2024-09-26 DIAGNOSIS — E119 Type 2 diabetes mellitus without complications: Secondary | ICD-10-CM | POA: Diagnosis not present

## 2024-09-26 DIAGNOSIS — N201 Calculus of ureter: Secondary | ICD-10-CM

## 2024-09-26 LAB — GLUCOSE, CAPILLARY: Glucose-Capillary: 216 mg/dL — ABNORMAL HIGH (ref 70–99)

## 2024-09-26 MED ORDER — LANTUS SOLOSTAR 100 UNIT/ML ~~LOC~~ SOPN: 44.0000 [IU] | PEN_INJECTOR | Freq: Every day | SUBCUTANEOUS | Status: AC

## 2024-09-26 NOTE — Discharge Summary (Addendum)
 " Physician Discharge Summary   Patient: Cindy Norris MRN: [id number] DOB: 1956/06/09  Admit date:     09/24/2024  Discharge date: 09/26/2024  Discharge Physician: Toribio Door   PCP: Dow Longs, PA-C   Recommendations at discharge:   See below  Polypharmacy On over 20 medications. Request outpatient review and eliminate unnecessary medications.  Discharge Diagnoses: Principal Problem:   Right Ureteral / Bilateral Renal Stones  Active Problems:   Type 2 diabetes mellitus without complications (HCC)   Essential hypertension   Fibromyalgia/DDD   Anxiety and depression   Seizures (HCC)   Migraine without aura   GERD (gastroesophageal reflux disease)   Hyperlipidemia   OSA (obstructive sleep apnea)  Resolved Problems:   * No resolved hospital problems. *  Hospital Course: 69 year old woman presenting with confusion and hyperglycemia.  Transferred from [facility] for urology consultation after imaging showed bilateral nonobstructing nephrolithiasis .  Consultants Urology   Procedures/Events 12/31 cystoscopy, bilateral stent insertion  Right Ureteral / Bilateral Renal Stones  Renal function stable.  No evidence of infection. Status post bilateral stents.  Discussed with Dr. Alvaro  Follow-up with urology in [place] to discuss definitive management either with lithotripsy or ureteroscopic   Diabetes mellitus type 2 CBG stable.  Can resume long-acting insulin    Essential hypertension Continue Coreg    Anxiety Depression Fibromyalgia Stable, continue gabapentin , Effexor , BuSpar , hydroxyzine    Seizure disorder Continue Keppra    Migraines Continue Topamax   Polypharmacy On over 20 medications. Request outpatient review and eliminate unnecessary medications.  Disposition: Home Diet recommendation:  Regular diet DISCHARGE MEDICATION: Allergies as of 09/26/2024       Reactions   Sulfonamide Derivatives Hives, Rash   Dilaudid [hydromorphone Hcl]  Palpitations, Rash   Silicone Other (See Comments)   blisters   Azithromycin Nausea And Vomiting   Other    Dawn dish detergent-RASH   Tape Other (See Comments)   Blister   Erythromycin Nausea Only   Lyrica [pregabalin] Swelling, Rash   Penicillins Nausea Only   Reglan [metoclopramide] Palpitations        Medication List     STOP taking these medications    azithromycin 250 MG tablet Commonly known as: ZITHROMAX   ibuprofen  800 MG tablet Commonly known as: ADVIL    oxyCODONE  5 MG immediate release tablet Commonly known as: Roxicodone        TAKE these medications    albuterol  108 (90 Base) MCG/ACT inhaler Commonly known as: VENTOLIN  HFA Inhale 1-2 puffs into the lungs as needed for wheezing or shortness of breath.   atorvastatin  80 MG tablet Commonly known as: LIPITOR  Take 80 mg by mouth at bedtime.   busPIRone  10 MG tablet Commonly known as: BUSPAR  Take 10 mg by mouth 2 (two) times daily.   carvedilol  3.125 MG tablet Commonly known as: COREG  Take 1 tablet (3.125 mg total) by mouth 2 (two) times daily.   ezetimibe  10 MG tablet Commonly known as: ZETIA  Take 10 mg by mouth daily.   famotidine 40 MG tablet Commonly known as: PEPCID Take 40 mg by mouth daily.   gabapentin  800 MG tablet Commonly known as: NEURONTIN  Take 800 mg by mouth 3 (three) times daily.   glipiZIDE 2.5 MG Tabs Take 2.5 mg by mouth daily.   HYDROcodone -acetaminophen  7.5-325 MG tablet Commonly known as: NORCO Take 1 tablet by mouth in the morning and at bedtime.   hydrOXYzine  25 MG tablet Commonly known as: ATARAX  Take 25 mg by mouth 2 (two) times daily.  Lantus SoloStar 100 UNIT/ML Solostar Pen Generic drug: insulin  glargine Inject 44 Units into the skin daily. What changed: how much to take   levETIRAcetam  1000 MG tablet Commonly known as: KEPPRA  Take 1,000 mg by mouth 2 (two) times daily.   lidocaine  5 % Commonly known as: LIDODERM  Place 1 patch onto the skin daily  as needed (pain). Remove & Discard patch within 12 hours or as directed by MD   metFORMIN  500 MG 24 hr tablet Commonly known as: GLUCOPHAGE -XR Take 500 mg by mouth 2 (two) times daily.   methocarbamol 500 MG tablet Commonly known as: ROBAXIN Take 500 mg by mouth 3 (three) times daily as needed.   midodrine  10 MG tablet Commonly known as: PROAMATINE  Take 15 mg by mouth 3 (three) times daily.   modafinil  200 MG tablet Commonly known as: PROVIGIL  Take 400 mg by mouth every morning.   multivitamin with minerals Tabs tablet Take 1 tablet by mouth daily.   Myrbetriq 25 MG Tb24 tablet Generic drug: mirabegron ER TAKE ONE TABLET BY MOUTH DAILY AT 9 AM   nabumetone  750 MG tablet Commonly known as: RELAFEN  Take 750 mg by mouth 2 (two) times daily.   omeprazole 40 MG capsule Commonly known as: PRILOSEC Take 80 mg by mouth at bedtime.   oxybutynin  5 MG tablet Commonly known as: DITROPAN  Take 5 mg by mouth 3 (three) times daily.   SUMAtriptan  50 MG tablet Commonly known as: IMITREX  Take 50-100 mg by mouth every 2 (two) hours as needed for migraine. May repeat in 2 hours if headache persists or recurs.   SYSTANE OP Place 1 drop into both eyes 2 (two) times daily.   topiramate  100 MG tablet Commonly known as: TOPAMAX  Take 100 mg by mouth 2 (two) times daily.   venlafaxine  XR 150 MG 24 hr capsule Commonly known as: EFFEXOR -XR Take 300 mg by mouth daily.        Follow-up Information     McKenzie, Belvie CROME, MD Follow up.   Specialty: Urology Why: Office will call to arrange visit wtih Dr. Sherrilee to discuss further stone management. Contact information: [address]  [place] [address] [phone]                Discharge Exam: [name]   09/24/24 1750 09/25/24 1300  Weight: 92.9 kg 92.5 kg   Physical Exam Vitals reviewed.  Constitutional:      General: She is not in acute distress.    Appearance: She is not ill-appearing or  toxic-appearing.  Cardiovascular:     Rate and Rhythm: Normal rate and regular rhythm.     Heart sounds: No murmur heard. Pulmonary:     Effort: Pulmonary effort is normal. No respiratory distress.     Breath sounds: No wheezing, rhonchi or rales.  Neurological:     Mental Status: She is alert.  Psychiatric:        Mood and Affect: Mood normal.        Behavior: Behavior normal.      Condition at discharge: good  The results of significant diagnostics from this hospitalization (including imaging, microbiology, ancillary and laboratory) are listed below for reference.   Imaging Studies: DG C-Arm 1-60 Min-No Report Result Date: 09/25/2024 Fluoroscopy was utilized by the requesting physician.  No radiographic interpretation.    Microbiology: Results for orders placed or performed during the hospital encounter of 02/24/24  Resp panel by RT-PCR (RSV, Flu A&B, Covid) Anterior Nasal Swab  Status: None   Collection Time: 02/24/24 11:38 PM   Specimen: Anterior Nasal Swab  Result Value Ref Range Status   SARS Coronavirus 2 by RT PCR NEGATIVE NEGATIVE Final    Comment: (NOTE) SARS-CoV-2 target nucleic acids are NOT DETECTED.  The SARS-CoV-2 RNA is generally detectable in upper respiratory specimens during the acute phase of infection. The lowest concentration of SARS-CoV-2 viral copies this assay can detect is 138 copies/mL. A negative result does not preclude SARS-Cov-2 infection and should not be used as the sole basis for treatment or other patient management decisions. A negative result may occur with  improper specimen collection/handling, submission of specimen other than nasopharyngeal swab, presence of viral mutation(s) within the areas targeted by this assay, and inadequate number of viral copies(<138 copies/mL). A negative result must be combined with clinical observations, patient history, and epidemiological information. The expected result is Negative.  Fact Sheet  for Patients:  bloggercourse.com  Fact Sheet for Healthcare Providers:  seriousbroker.it  This test is no t yet approved or cleared by the [place]  FDA and  has been authorized for detection and/or diagnosis of SARS-CoV-2 by FDA under an Emergency Use Authorization (EUA). This EUA will remain  in effect (meaning this test can be used) for the duration of the COVID-19 declaration under Section 564(b)(1) of the Act, 21 U.S.C.section 360bbb-3(b)(1), unless the authorization is terminated  or revoked sooner.       Influenza A by PCR NEGATIVE NEGATIVE Final   Influenza B by PCR NEGATIVE NEGATIVE Final    Comment: (NOTE) The Xpert Xpress SARS-CoV-2/FLU/RSV plus assay is intended as an aid in the diagnosis of influenza from Nasopharyngeal swab specimens and should not be used as a sole basis for treatment. Nasal washings and aspirates are unacceptable for Xpert Xpress SARS-CoV-2/FLU/RSV testing.  Fact Sheet for Patients: bloggercourse.com  Fact Sheet for Healthcare Providers: seriousbroker.it  This test is not yet approved or cleared by the [place]  FDA and has been authorized for detection and/or diagnosis of SARS-CoV-2 by FDA under an Emergency Use Authorization (EUA). This EUA will remain in effect (meaning this test can be used) for the duration of the COVID-19 declaration under Section 564(b)(1) of the Act, 21 U.S.C. section 360bbb-3(b)(1), unless the authorization is terminated or revoked.     Resp Syncytial Virus by PCR NEGATIVE NEGATIVE Final    Comment: (NOTE) Fact Sheet for Patients: bloggercourse.com  Fact Sheet for Healthcare Providers: seriousbroker.it  This test is not yet approved or cleared by the [place]  FDA and has been authorized for detection and/or diagnosis of SARS-CoV-2 by FDA under an  Emergency Use Authorization (EUA). This EUA will remain in effect (meaning this test can be used) for the duration of the COVID-19 declaration under Section 564(b)(1) of the Act, 21 U.S.C. section 360bbb-3(b)(1), unless the authorization is terminated or revoked.  Performed at [facility] [address], [place], [place] [address]     Labs: CBC: Recent Labs  Lab 09/24/24 2348 09/25/24 0612  WBC 5.9 5.8  NEUTROABS 3.4  --   HGB 14.1 14.5  HCT 41.8 43.7  MCV 86.9 87.9  PLT 225 242   Basic Metabolic Panel: Recent Labs  Lab 09/24/24 2348 09/25/24 0612  NA 141 141  K 3.1* 3.1*  CL 108 109  CO2 20* 20*  GLUCOSE 157* 201*  BUN 14 14  CREATININE 0.72 0.68  CALCIUM  10.1 10.0   Liver Function Tests: Recent Labs  Lab 09/24/24 2348  AST 26  ALT 41  ALKPHOS 138*  BILITOT 0.4  PROT 6.6  ALBUMIN 4.1   CBG: Recent Labs  Lab 09/25/24 0815 09/25/24 1146 09/25/24 1555 09/25/24 2132 09/26/24 0726  GLUCAP 160* 184* 171* 266* 216*    Discharge time spent: less than 30 minutes.  Signed: Toribio Door, MD [facility] 09/26/2024 "

## 2024-09-26 NOTE — TOC Transition Note (Signed)
 Transition of Care [facility]) - Discharge Note   Patient Details  Name: Cindy Norris MRN: [id number] Date of Birth: July 05, 1956  Transition of Care [facility]) CM/SW Contact:  Bascom Service, RN Phone Number: 09/26/2024, 10:28 AM   Clinical Narrative:  d/c home no CM needs.     Final next level of care: Home/Self Care Barriers to Discharge: No Barriers Identified   Patient Goals and CMS Choice Patient states their goals for this hospitalization and ongoing recovery are:: Home CMS Medicare.gov Compare Post Acute Care list provided to:: Patient Choice offered to / list presented to : Patient      Discharge Placement                       Discharge Plan and Services Additional resources added to the After Visit Summary for     Discharge Planning Services: CM Consult                                 Social Drivers of Health (SDOH) Interventions SDOH Screenings   Food Insecurity: No Food Insecurity (09/24/2024)  Housing: Low Risk (09/24/2024)  Transportation Needs: No Transportation Needs (09/24/2024)  Utilities: Not At Risk (09/24/2024)  Depression (PHQ2-9): High Risk (06/20/2024)  Financial Resource Strain: Medium Risk (09/08/2023)   Received from [facility]  Physical Activity: Inactive (07/17/2024)  Social Connections: Moderately Isolated (09/24/2024)  Stress: Stress Concern Present (06/20/2024)  Tobacco Use: Medium Risk (09/25/2024)  Health Literacy: Adequate Health Literacy (07/17/2024)     Readmission Risk Interventions     No data to display

## 2024-09-26 NOTE — Plan of Care (Signed)

## 2024-10-15 ENCOUNTER — Telehealth: Payer: Self-pay

## 2024-10-15 NOTE — Telephone Encounter (Signed)
 Patient called stating she was in pain on a scale of 9/10 patient was advised a message would be sent to MD for review up on review it was noted that MD [facility] did patient surgery and she needs to follow up with him I attempted to call patient back to advise her however she did not answer the phone

## 2024-10-24 ENCOUNTER — Ambulatory Visit: Admitting: Nurse Practitioner

## 2024-10-28 ENCOUNTER — Encounter: Payer: Self-pay | Admitting: Nurse Practitioner

## 2024-10-30 ENCOUNTER — Ambulatory Visit: Admitting: Urology

## 2024-10-30 VITALS — BP 126/73 | HR 112

## 2024-10-30 DIAGNOSIS — N200 Calculus of kidney: Secondary | ICD-10-CM

## 2024-10-30 DIAGNOSIS — R32 Unspecified urinary incontinence: Secondary | ICD-10-CM

## 2024-10-30 LAB — MICROSCOPIC EXAMINATION
RBC, Urine: >30 /HPF — AB (ref 0–2)
WBC, UA: >30 /HPF — AB (ref 0–5)

## 2024-10-30 LAB — URINALYSIS, ROUTINE W REFLEX MICROSCOPIC
Bilirubin, UA: NEGATIVE
Ketones, UA: NEGATIVE
Nitrite, UA: NEGATIVE
Specific Gravity, UA: 1.015 (ref 1.005–1.030)
Urobilinogen, Ur: 0.2 mg/dL (ref 0.2–1.0)
pH, UA: 6.5 (ref 5.0–7.5)

## 2024-10-30 MED ORDER — MIRABEGRON ER 25 MG PO TB24: 25.0000 mg | ORAL_TABLET | Freq: Every day | ORAL | 0 refills | Status: AC

## 2024-10-30 MED ORDER — OXYCODONE-ACETAMINOPHEN 10-325 MG PO TABS: 1.0000 | ORAL_TABLET | ORAL | 0 refills | Status: AC | PRN

## 2024-10-30 NOTE — Progress Notes (Unsigned)
 "  10/30/2024 11:49 AM   Cindy Norris 1956-02-13 [id number]  Referring provider: Dow Longs, PA-C [address] [place],  [place] [address]  No chief complaint on file.   HPI: Cindy Norris is a 29bn here for followup for nephrolithiasis and urinary incontinence. She underwent bilateral ureteral stent placement 12/31 for right ureteral calculi after ESWL    PMH: Past Medical History:  Diagnosis Date   Asthma    Chronic pain syndrome    Degenerative disc disease, lumbar    Depression    Diabetes mellitus    GERD (gastroesophageal reflux disease)    History of kidney stones    Hyperlipidemia, mixed    Migraines    Narcolepsy    Osteoarthritis    Overweight(278.02)    Obesity   Seizures (HCC)    unknown etiology-on meds-last seizure was several years ago   Unspecified essential hypertension     Surgical History: Past Surgical History:  Procedure Laterality Date   BREAST MASS EXCISION Right    CATARACT EXTRACTION W/PHACO Left 05/09/2022   Procedure: CATARACT EXTRACTION PHACO AND INTRAOCULAR LENS PLACEMENT (IOC);  Surgeon: Harrie Agent, MD;  Location: [facility];  Service: Ophthalmology;  Laterality: Left;  CDE: 10.22   CATARACT EXTRACTION W/PHACO Right 05/23/2022   Procedure: CATARACT EXTRACTION PHACO AND INTRAOCULAR LENS PLACEMENT (IOC);  Surgeon: Harrie Agent, MD;  Location: [facility];  Service: Ophthalmology;  Laterality: Right;  CDE 7.15   CYSTOSCOPY W/ URETERAL STENT PLACEMENT Bilateral 09/25/2024   Procedure: CYSTOSCOPY, WITH RETROGRADE PYELOGRAM AND URETERAL STENT INSERTION;  Surgeon: Alvaro Ricardo KATHEE Mickey., MD;  Location: [facility];  Service: Urology;  Laterality: Bilateral;   IR NEPHROURETERAL CATH PLACE RIGHT  12/21/2022   KNEE ARTHROSCOPY     NEPHROLITHOTOMY Right 12/22/2022   Procedure: NEPHROLITHOTOMY PERCUTANEOUS;  Surgeon: Sherrilee Belvie CROME, MD;  Location: [facility];  Service: Urology;  Laterality: Right;   PILONIDAL CYST EXCISION     SHOULDER SURGERY Left    TOTAL KNEE  ARTHROPLASTY Bilateral     Home Medications:  Allergies as of 10/30/2024       Reactions   Sulfonamide Derivatives Hives, Rash   Dilaudid [hydromorphone Hcl] Palpitations, Rash   Silicone Other (See Comments)   blisters   Azithromycin Nausea And Vomiting   Other    Norris dish detergent-RASH   Tape Other (See Comments)   Blister   Erythromycin Nausea Only   Lyrica [pregabalin] Swelling, Rash   Penicillins Nausea Only   Reglan [metoclopramide] Palpitations        Medication List        Accurate as of October 30, 2024 11:49 AM. If you have any questions, ask your nurse or doctor.          albuterol  108 (90 Base) MCG/ACT inhaler Commonly known as: VENTOLIN  HFA Inhale 1-2 puffs into the lungs as needed for wheezing or shortness of breath.   atorvastatin  80 MG tablet Commonly known as: LIPITOR  Take 80 mg by mouth at bedtime.   busPIRone  10 MG tablet Commonly known as: BUSPAR  Take 10 mg by mouth 2 (two) times daily.   carvedilol  3.125 MG tablet Commonly known as: COREG  Take 1 tablet (3.125 mg total) by mouth 2 (two) times daily.   ezetimibe  10 MG tablet Commonly known as: ZETIA  Take 10 mg by mouth daily.   famotidine  40 MG tablet Commonly known as: PEPCID  Take 40 mg by mouth daily.   gabapentin  800 MG tablet Commonly known as: NEURONTIN  Take 800  mg by mouth 3 (three) times daily.   glipiZIDE 2.5 MG Tabs Take 2.5 mg by mouth daily.   HYDROcodone -acetaminophen  7.5-325 MG tablet Commonly known as: NORCO Take 1 tablet by mouth in the morning and at bedtime.   hydrOXYzine  25 MG tablet Commonly known as: ATARAX  Take 25 mg by mouth 2 (two) times daily.   Lantus  SoloStar 100 UNIT/ML Solostar Pen Generic drug: insulin  glargine Inject 44 Units into the skin daily.   levETIRAcetam  1000 MG tablet Commonly known as: KEPPRA  Take 1,000 mg by mouth 2 (two) times daily.   lidocaine  5 % Commonly known as: LIDODERM  Place 1 patch onto the skin daily as needed  (pain). Remove & Discard patch within 12 hours or as directed by MD   metFORMIN  500 MG 24 hr tablet Commonly known as: GLUCOPHAGE -XR Take 500 mg by mouth 2 (two) times daily.   methocarbamol  500 MG tablet Commonly known as: ROBAXIN  Take 500 mg by mouth 3 (three) times daily as needed.   midodrine  10 MG tablet Commonly known as: PROAMATINE  Take 15 mg by mouth 3 (three) times daily.   modafinil  200 MG tablet Commonly known as: PROVIGIL  Take 400 mg by mouth every morning.   multivitamin with minerals Tabs tablet Take 1 tablet by mouth daily.   Myrbetriq  25 MG Tb24 tablet Generic drug: mirabegron  ER TAKE ONE TABLET BY MOUTH DAILY AT 9 AM   nabumetone  750 MG tablet Commonly known as: RELAFEN  Take 750 mg by mouth 2 (two) times daily.   omeprazole 40 MG capsule Commonly known as: PRILOSEC Take 80 mg by mouth at bedtime.   oxybutynin  5 MG tablet Commonly known as: DITROPAN  Take 5 mg by mouth 3 (three) times daily.   SUMAtriptan  50 MG tablet Commonly known as: IMITREX  Take 50-100 mg by mouth every 2 (two) hours as needed for migraine. May repeat in 2 hours if headache persists or recurs.   SYSTANE OP Place 1 drop into both eyes 2 (two) times daily.   topiramate  100 MG tablet Commonly known as: TOPAMAX  Take 100 mg by mouth 2 (two) times daily.   venlafaxine  XR 150 MG 24 hr capsule Commonly known as: EFFEXOR -XR Take 300 mg by mouth daily.        Allergies: Allergies[1]  Family History: Family History  Problem Relation Age of Onset   Coronary artery disease Other     Social History:  reports that she quit smoking about 36 years ago. Her smoking use included cigarettes. She started smoking about 40 years ago. She has a 2 pack-year smoking history. She has never used smokeless tobacco. She reports that she does not drink alcohol  and does not use drugs.  ROS: All other review of systems were reviewed and are negative except what is noted above in HPI  Physical  Exam: BP 126/73   Pulse (!) 112   Constitutional:  Alert and oriented, No acute distress. HEENT: [place] AT, moist mucus membranes.  Trachea midline, no masses. Cardiovascular: No clubbing, cyanosis, or edema. Respiratory: Normal respiratory effort, no increased work of breathing. GI: Abdomen is soft, nontender, nondistended, no abdominal masses GU: No CVA tenderness.  Lymph: No cervical or inguinal lymphadenopathy. Skin: No rashes, bruises or suspicious lesions. Neurologic: Grossly intact, no focal deficits, moving all 4 extremities. Psychiatric: Normal mood and affect.  Laboratory Data: Lab Results  Component Value Date   WBC 5.8 09/25/2024   HGB 14.5 09/25/2024   HCT 43.7 09/25/2024   MCV 87.9 09/25/2024   PLT  242 09/25/2024    Lab Results  Component Value Date   CREATININE 0.68 09/25/2024    No results found for: PSA  No results found for: TESTOSTERONE  Lab Results  Component Value Date   HGBA1C 7.7 06/10/2024    Urinalysis    Component Value Date/Time   COLORURINE STRAW (A) 01/29/2020 1508   APPEARANCEUR Clear 02/24/2023 1259   LABSPEC 1.011 01/29/2020 1508   PHURINE 6.0 01/29/2020 1508   GLUCOSEU Negative 02/24/2023 1259   HGBUR MODERATE (A) 01/29/2020 1508   BILIRUBINUR Negative 02/24/2023 1259   KETONESUR NEGATIVE 01/29/2020 1508   PROTEINUR Negative 02/24/2023 1259   PROTEINUR NEGATIVE 01/29/2020 1508   UROBILINOGEN 2.0 (H) 06/19/2008 1206   NITRITE Negative 02/24/2023 1259   NITRITE NEGATIVE 01/29/2020 1508   LEUKOCYTESUR 1+ (A) 02/24/2023 1259   LEUKOCYTESUR NEGATIVE 01/29/2020 1508    Lab Results  Component Value Date   LABMICR See below: 02/24/2023   WBCUA 6-10 (A) 02/24/2023   LABEPIT 0-10 02/24/2023   BACTERIA None seen 02/24/2023    Pertinent Imaging: *** Results for orders placed in visit on 11/08/22  DG Abd 1 View  Narrative CLINICAL DATA:  History of kidney stones.  Currently asymptomatic.  EXAM: ABDOMEN - 1  VIEW  COMPARISON:  CT 10/17/2022  FINDINGS: Bowel gas pattern is nonobstructive. There are several air-filled nondilated large and small bowel loops. No free peritoneal air. Evidence of patient's known right-sided nephrolithiasis as the largest stone measures 3 cm over the renal pelvis unchanged. No definitive stones seen over the left kidney. Surgical sutures over the lower abdomen/pelvis compatible with prior abdominal wall hernia repair. Remainder of the exam is unremarkable.  IMPRESSION: 1. Nonobstructive bowel gas pattern. 2. Right-sided nephrolithiasis unchanged.   Electronically Signed By: Toribio Agreste M.D. On: 11/10/2022 14:25  No results found for this or any previous visit.  No results found for this or any previous visit.  No results found for this or any previous visit.  Results for orders placed during the hospital encounter of 02/24/23  Ultrasound renal complete  Narrative CLINICAL DATA:  Nephrolithiasis follow-up.  EXAM: RENAL / URINARY TRACT ULTRASOUND COMPLETE  COMPARISON:  CT abdomen pelvis 10/17/2022  FINDINGS: Right Kidney:  Renal measurements: 13.1 x 6.2 x 6.2 cm = volume: 261.3 mL. Normal renal cortical thickness and echogenicity. There is an exophytic 1.5 x 1.3 x 1.4 cm hypoechoic mass midpole right kidney, potentially a complicated cyst. No hydronephrosis.  Left Kidney:  Renal measurements: 12.9 x 6.5 x 5.9 cm = volume: 256.1 mL. Echogenicity within normal limits. No mass or hydronephrosis visualized.  Bladder:  Appears normal for degree of bladder distention.  Other:  None.  IMPRESSION: 1. No hydronephrosis.  No definite nephrolithiasis. 2. Exophytic 1.5 cm hypoechoic mass midpole right kidney, potentially a complicated cyst. Recommend follow-up ultrasound in 6 months to assess for stability.   Electronically Signed By: Bard Moats M.D. On: 02/24/2023 12:08  No results found for this or any previous visit.  No  results found for this or any previous visit.  No results found for this or any previous visit.   Assessment & Plan:    1. Urinary incontinence, unspecified type (Primary) *** - Urinalysis, Routine w reflex microscopic  2. Kidney stones ***   No follow-ups on file.  Belvie Clara, MD  [facility] [place]      [1]  Allergies Allergen Reactions   Sulfonamide Derivatives Hives and Rash   Dilaudid [Hydromorphone Hcl] Palpitations and  Rash   Silicone Other (See Comments)    blisters   Azithromycin Nausea And Vomiting   Other     Norris dish detergent-RASH   Tape Other (See Comments)    Blister   Erythromycin Nausea Only   Lyrica [Pregabalin] Swelling and Rash   Penicillins Nausea Only   Reglan [Metoclopramide] Palpitations   "

## 2025-02-03 ENCOUNTER — Ambulatory Visit (HOSPITAL_COMMUNITY): Admitting: Registered Nurse
